# Patient Record
Sex: Male | Born: 1960 | Race: Black or African American | Hispanic: No | Marital: Single | Smoking: Current every day smoker
Health system: Southern US, Community
[De-identification: ages and names within clinical notes are randomized; demographics above are authoritative.]

## PROBLEM LIST (undated history)

## (undated) DIAGNOSIS — M1990 Unspecified osteoarthritis, unspecified site: Secondary | ICD-10-CM

## (undated) DIAGNOSIS — B1920 Unspecified viral hepatitis C without hepatic coma: Secondary | ICD-10-CM

## (undated) DIAGNOSIS — B356 Tinea cruris: Secondary | ICD-10-CM

## (undated) HISTORY — PX: OTHER SURGICAL HISTORY: SHX169

## (undated) HISTORY — PX: COLON SURGERY: SHX602

---

## 2003-10-12 ENCOUNTER — Emergency Department: Payer: Self-pay | Admitting: Emergency Medicine

## 2004-03-14 ENCOUNTER — Emergency Department: Payer: Self-pay | Admitting: General Practice

## 2007-08-28 ENCOUNTER — Emergency Department: Payer: Self-pay | Admitting: Internal Medicine

## 2007-12-06 ENCOUNTER — Emergency Department: Payer: Self-pay | Admitting: Emergency Medicine

## 2008-01-27 ENCOUNTER — Emergency Department: Payer: Self-pay | Admitting: Internal Medicine

## 2009-12-06 ENCOUNTER — Emergency Department: Payer: Self-pay | Admitting: Emergency Medicine

## 2011-02-06 ENCOUNTER — Emergency Department (HOSPITAL_COMMUNITY): Payer: Medicaid Other

## 2011-02-06 ENCOUNTER — Encounter (HOSPITAL_COMMUNITY): Payer: Self-pay | Admitting: *Deleted

## 2011-02-06 ENCOUNTER — Inpatient Hospital Stay (HOSPITAL_COMMUNITY)
Admission: EM | Admit: 2011-02-06 | Discharge: 2011-02-13 | DRG: 329 | Disposition: A | Discharge status: Home / Self Care | Payer: Medicaid Other | Attending: General Surgery | Admitting: General Surgery

## 2011-02-06 ENCOUNTER — Encounter (HOSPITAL_COMMUNITY): Payer: Self-pay | Admitting: Emergency Medicine

## 2011-02-06 ENCOUNTER — Encounter (HOSPITAL_COMMUNITY): Payer: Self-pay | Admitting: Anesthesiology

## 2011-02-06 ENCOUNTER — Inpatient Hospital Stay (HOSPITAL_COMMUNITY): Payer: Medicaid Other

## 2011-02-06 ENCOUNTER — Encounter (HOSPITAL_COMMUNITY): Admission: EM | Disposition: A | Discharge status: Home / Self Care | Payer: Self-pay | Source: Home / Self Care

## 2011-02-06 ENCOUNTER — Other Ambulatory Visit (INDEPENDENT_AMBULATORY_CARE_PROVIDER_SITE_OTHER): Payer: Self-pay | Admitting: General Surgery

## 2011-02-06 ENCOUNTER — Emergency Department (HOSPITAL_COMMUNITY): Payer: Medicaid Other | Admitting: Anesthesiology

## 2011-02-06 ENCOUNTER — Other Ambulatory Visit: Payer: Self-pay

## 2011-02-06 DIAGNOSIS — S93401A Sprain of unspecified ligament of right ankle, initial encounter: Secondary | ICD-10-CM

## 2011-02-06 DIAGNOSIS — K929 Disease of digestive system, unspecified: Secondary | ICD-10-CM | POA: Diagnosis not present

## 2011-02-06 DIAGNOSIS — R21 Rash and other nonspecific skin eruption: Secondary | ICD-10-CM | POA: Diagnosis present

## 2011-02-06 DIAGNOSIS — S058X9A Other injuries of unspecified eye and orbit, initial encounter: Secondary | ICD-10-CM | POA: Diagnosis present

## 2011-02-06 DIAGNOSIS — S025XXA Fracture of tooth (traumatic), initial encounter for closed fracture: Secondary | ICD-10-CM

## 2011-02-06 DIAGNOSIS — R000 Tachycardia, unspecified: Secondary | ICD-10-CM | POA: Diagnosis present

## 2011-02-06 DIAGNOSIS — S36893A Laceration of other intra-abdominal organs, initial encounter: Secondary | ICD-10-CM | POA: Diagnosis present

## 2011-02-06 DIAGNOSIS — S92109A Unspecified fracture of unspecified talus, initial encounter for closed fracture: Secondary | ICD-10-CM | POA: Diagnosis present

## 2011-02-06 DIAGNOSIS — S0191XA Laceration without foreign body of unspecified part of head, initial encounter: Secondary | ICD-10-CM

## 2011-02-06 DIAGNOSIS — K661 Hemoperitoneum: Secondary | ICD-10-CM

## 2011-02-06 DIAGNOSIS — F17200 Nicotine dependence, unspecified, uncomplicated: Secondary | ICD-10-CM | POA: Diagnosis present

## 2011-02-06 DIAGNOSIS — S8000XA Contusion of unspecified knee, initial encounter: Secondary | ICD-10-CM | POA: Diagnosis present

## 2011-02-06 DIAGNOSIS — H538 Other visual disturbances: Secondary | ICD-10-CM | POA: Diagnosis present

## 2011-02-06 DIAGNOSIS — E870 Hyperosmolality and hypernatremia: Secondary | ICD-10-CM | POA: Diagnosis not present

## 2011-02-06 DIAGNOSIS — K659 Peritonitis, unspecified: Secondary | ICD-10-CM | POA: Diagnosis present

## 2011-02-06 DIAGNOSIS — S0180XA Unspecified open wound of other part of head, initial encounter: Secondary | ICD-10-CM | POA: Diagnosis present

## 2011-02-06 DIAGNOSIS — K029 Dental caries, unspecified: Secondary | ICD-10-CM | POA: Diagnosis present

## 2011-02-06 DIAGNOSIS — M1990 Unspecified osteoarthritis, unspecified site: Secondary | ICD-10-CM | POA: Diagnosis present

## 2011-02-06 DIAGNOSIS — S92101A Unspecified fracture of right talus, initial encounter for closed fracture: Secondary | ICD-10-CM | POA: Diagnosis present

## 2011-02-06 DIAGNOSIS — K567 Ileus, unspecified: Secondary | ICD-10-CM | POA: Diagnosis not present

## 2011-02-06 DIAGNOSIS — IMO0002 Reserved for concepts with insufficient information to code with codable children: Secondary | ICD-10-CM | POA: Diagnosis present

## 2011-02-06 DIAGNOSIS — S82891A Other fracture of right lower leg, initial encounter for closed fracture: Secondary | ICD-10-CM | POA: Diagnosis present

## 2011-02-06 DIAGNOSIS — B356 Tinea cruris: Secondary | ICD-10-CM | POA: Diagnosis present

## 2011-02-06 DIAGNOSIS — S36899A Unspecified injury of other intra-abdominal organs, initial encounter: Secondary | ICD-10-CM | POA: Diagnosis present

## 2011-02-06 DIAGNOSIS — F10929 Alcohol use, unspecified with intoxication, unspecified: Secondary | ICD-10-CM

## 2011-02-06 DIAGNOSIS — D62 Acute posthemorrhagic anemia: Secondary | ICD-10-CM | POA: Diagnosis not present

## 2011-02-06 DIAGNOSIS — S8263XA Displaced fracture of lateral malleolus of unspecified fibula, initial encounter for closed fracture: Secondary | ICD-10-CM | POA: Diagnosis present

## 2011-02-06 DIAGNOSIS — S0181XA Laceration without foreign body of other part of head, initial encounter: Secondary | ICD-10-CM | POA: Diagnosis present

## 2011-02-06 DIAGNOSIS — K560 Paralytic ileus: Secondary | ICD-10-CM | POA: Diagnosis not present

## 2011-02-06 DIAGNOSIS — M069 Rheumatoid arthritis, unspecified: Secondary | ICD-10-CM | POA: Insufficient documentation

## 2011-02-06 DIAGNOSIS — S3681XA Injury of peritoneum, initial encounter: Secondary | ICD-10-CM

## 2011-02-06 HISTORY — DX: Unspecified osteoarthritis, unspecified site: M19.90

## 2011-02-06 HISTORY — PX: APPENDECTOMY: SHX54

## 2011-02-06 HISTORY — PX: LACERATION REPAIR: SHX5284

## 2011-02-06 HISTORY — PX: BOWEL RESECTION: SHX1257

## 2011-02-06 HISTORY — PX: LAPAROTOMY: SHX154

## 2011-02-06 LAB — POCT I-STAT, CHEM 8
BUN: 9 mg/dL (ref 6–23)
Calcium, Ion: 1.15 mmol/L (ref 1.12–1.32)
Glucose, Bld: 106 mg/dL — ABNORMAL HIGH (ref 70–99)
TCO2: 21 mmol/L (ref 0–100)

## 2011-02-06 LAB — PROTIME-INR
INR: 0.99 (ref 0.00–1.49)
Prothrombin Time: 13.3 seconds (ref 11.6–15.2)

## 2011-02-06 LAB — COMPREHENSIVE METABOLIC PANEL
ALT: 47 U/L (ref 0–53)
Albumin: 3.6 g/dL (ref 3.5–5.2)
Alkaline Phosphatase: 80 U/L (ref 39–117)
Potassium: 3.6 mEq/L (ref 3.5–5.1)
Sodium: 144 mEq/L (ref 135–145)
Total Protein: 7.3 g/dL (ref 6.0–8.3)

## 2011-02-06 LAB — URINALYSIS, MICROSCOPIC ONLY
Bilirubin Urine: NEGATIVE
Protein, ur: 30 mg/dL — AB
Urobilinogen, UA: 0.2 mg/dL (ref 0.0–1.0)

## 2011-02-06 LAB — CBC
HCT: 45.4 % (ref 39.0–52.0)
MCHC: 33 g/dL (ref 30.0–36.0)
MCV: 89.7 fL (ref 78.0–100.0)
RDW: 15 % (ref 11.5–15.5)

## 2011-02-06 LAB — MRSA PCR SCREENING: MRSA by PCR: NEGATIVE

## 2011-02-06 LAB — ETHANOL: Alcohol, Ethyl (B): 117 mg/dL — ABNORMAL HIGH (ref 0–11)

## 2011-02-06 SURGERY — LAPAROTOMY, EXPLORATORY
Anesthesia: General | Site: Face | Laterality: Right | Wound class: Clean Contaminated

## 2011-02-06 MED ORDER — SODIUM CHLORIDE 0.9 % IJ SOLN: 9.0000 mL | INTRAMUSCULAR | Status: DC | PRN

## 2011-02-06 MED ORDER — PROPOFOL 10 MG/ML IV EMUL
INTRAVENOUS | Status: DC | PRN
  Administered 2011-02-06: 20 mg via INTRAVENOUS
  Administered 2011-02-06: 180 mg via INTRAVENOUS

## 2011-02-06 MED ORDER — GLYCOPYRROLATE 0.2 MG/ML IJ SOLN
INTRAMUSCULAR | Status: DC | PRN
  Administered 2011-02-06: .8 mg via INTRAVENOUS

## 2011-02-06 MED ORDER — TOLNAFTATE 1 % EX POWD: Freq: Two times a day (BID) | CUTANEOUS | Status: DC

## 2011-02-06 MED ORDER — IOHEXOL 300 MG/ML  SOLN: 100.0000 mL | Freq: Once | INTRAMUSCULAR | Status: DC | PRN

## 2011-02-06 MED ORDER — 0.9 % SODIUM CHLORIDE (POUR BTL) OPTIME
TOPICAL | Status: DC | PRN
  Administered 2011-02-06: 7000 mL

## 2011-02-06 MED ORDER — HYDROMORPHONE HCL PF 1 MG/ML IJ SOLN
0.2500 mg | INTRAMUSCULAR | Status: DC | PRN
  Administered 2011-02-06 (×4): 0.5 mg via INTRAVENOUS

## 2011-02-06 MED ORDER — SODIUM CHLORIDE 0.9 % IV SOLN
INTRAVENOUS | Status: DC | PRN
  Administered 2011-02-06: 09:00:00 via INTRAVENOUS

## 2011-02-06 MED ORDER — SUCCINYLCHOLINE CHLORIDE 20 MG/ML IJ SOLN
INTRAMUSCULAR | Status: DC | PRN
  Administered 2011-02-06: 110 mg via INTRAVENOUS

## 2011-02-06 MED ORDER — PANTOPRAZOLE SODIUM 40 MG PO TBEC
40.0000 mg | DELAYED_RELEASE_TABLET | Freq: Every day | ORAL | Status: DC
Start: 2011-02-06 — End: 2011-02-13
  Administered 2011-02-09 – 2011-02-12 (×3): 40 mg via ORAL
  Filled 2011-02-06 (×3): qty 1

## 2011-02-06 MED ORDER — ROCURONIUM BROMIDE 100 MG/10ML IV SOLN
INTRAVENOUS | Status: DC | PRN
  Administered 2011-02-06 (×2): 10 mg via INTRAVENOUS
  Administered 2011-02-06: 40 mg via INTRAVENOUS

## 2011-02-06 MED ORDER — POVIDONE-IODINE 10 % EX OINT
TOPICAL_OINTMENT | CUTANEOUS | Status: DC | PRN
  Administered 2011-02-06: 1 via TOPICAL

## 2011-02-06 MED ORDER — SODIUM CHLORIDE 0.9 % IV SOLN
3.0000 g | Freq: Four times a day (QID) | INTRAVENOUS | Status: AC
  Administered 2011-02-06 – 2011-02-07 (×3): 3 g via INTRAVENOUS
  Filled 2011-02-06 (×3): qty 3

## 2011-02-06 MED ORDER — FENTANYL CITRATE 0.05 MG/ML IJ SOLN
INTRAMUSCULAR | Status: DC | PRN
  Administered 2011-02-06 (×4): 100 ug via INTRAVENOUS
  Administered 2011-02-06: 50 ug via INTRAVENOUS

## 2011-02-06 MED ORDER — NEOSTIGMINE METHYLSULFATE 1 MG/ML IJ SOLN
INTRAMUSCULAR | Status: DC | PRN
  Administered 2011-02-06: 5 mg via INTRAVENOUS

## 2011-02-06 MED ORDER — SODIUM CHLORIDE 0.9 % IV SOLN
INTRAVENOUS | Status: DC
  Administered 2011-02-06: 07:00:00 via INTRAVENOUS

## 2011-02-06 MED ORDER — MIDAZOLAM HCL 5 MG/5ML IJ SOLN
INTRAMUSCULAR | Status: DC | PRN
  Administered 2011-02-06 (×2): 1 mg via INTRAVENOUS

## 2011-02-06 MED ORDER — PHENYLEPHRINE HCL 10 MG/ML IJ SOLN
10.0000 mg | INTRAMUSCULAR | Status: DC | PRN
  Administered 2011-02-06: 20 ug/min via INTRAVENOUS

## 2011-02-06 MED ORDER — HYDROMORPHONE HCL PF 1 MG/ML IJ SOLN
INTRAMUSCULAR | Status: AC
  Filled 2011-02-06: qty 1

## 2011-02-06 MED ORDER — DROPERIDOL 2.5 MG/ML IJ SOLN: 0.6250 mg | INTRAMUSCULAR | Status: DC | PRN

## 2011-02-06 MED ORDER — CEFAZOLIN SODIUM 1-5 GM-% IV SOLN
1.0000 g | Freq: Once | INTRAVENOUS | Status: AC
  Administered 2011-02-06: 1 g via INTRAVENOUS
  Filled 2011-02-06: qty 50

## 2011-02-06 MED ORDER — FENTANYL CITRATE 0.05 MG/ML IJ SOLN
50.0000 ug | INTRAMUSCULAR | Status: DC | PRN
  Administered 2011-02-06: 50 ug via INTRAVENOUS
  Filled 2011-02-06: qty 2

## 2011-02-06 MED ORDER — NALOXONE HCL 0.4 MG/ML IJ SOLN: 0.4000 mg | INTRAMUSCULAR | Status: DC | PRN

## 2011-02-06 MED ORDER — ONDANSETRON HCL 4 MG/2ML IJ SOLN
4.0000 mg | Freq: Four times a day (QID) | INTRAMUSCULAR | Status: DC | PRN
  Administered 2011-02-06 – 2011-02-09 (×2): 4 mg via INTRAVENOUS
  Filled 2011-02-06 (×3): qty 2

## 2011-02-06 MED ORDER — DIPHENHYDRAMINE HCL 50 MG/ML IJ SOLN
12.5000 mg | Freq: Four times a day (QID) | INTRAMUSCULAR | Status: DC | PRN
  Administered 2011-02-08: 12.5 mg via INTRAVENOUS
  Filled 2011-02-06: qty 1

## 2011-02-06 MED ORDER — POTASSIUM CHLORIDE IN NACL 20-0.45 MEQ/L-% IV SOLN
INTRAVENOUS | Status: DC
  Administered 2011-02-06 – 2011-02-09 (×7): via INTRAVENOUS
  Administered 2011-02-09: 100 mL/h via INTRAVENOUS
  Administered 2011-02-10 – 2011-02-13 (×7): via INTRAVENOUS
  Filled 2011-02-06 (×22): qty 1000

## 2011-02-06 MED ORDER — PANTOPRAZOLE SODIUM 40 MG IV SOLR
40.0000 mg | Freq: Every day | INTRAVENOUS | Status: DC
  Administered 2011-02-06 – 2011-02-10 (×4): 40 mg via INTRAVENOUS
  Filled 2011-02-06 (×7): qty 40

## 2011-02-06 MED ORDER — SODIUM CHLORIDE 0.9 % IV SOLN
3.0000 g | Freq: Once | INTRAVENOUS | Status: AC
  Administered 2011-02-06: 3 g via INTRAVENOUS
  Filled 2011-02-06: qty 3

## 2011-02-06 MED ORDER — MORPHINE SULFATE (PF) 1 MG/ML IV SOLN
INTRAVENOUS | Status: DC
  Administered 2011-02-06: 9 mg via INTRAVENOUS
  Administered 2011-02-06: 25 mL via INTRAVENOUS
  Administered 2011-02-06: 16:00:00 via INTRAVENOUS
  Administered 2011-02-06: 8.5 mg via INTRAVENOUS
  Administered 2011-02-06: 24.73 mg via INTRAVENOUS
  Administered 2011-02-07: 15 mg via INTRAVENOUS
  Administered 2011-02-07: 13 mg via INTRAVENOUS
  Administered 2011-02-07: 31 mg via INTRAVENOUS
  Administered 2011-02-07: 23:00:00 via INTRAVENOUS
  Administered 2011-02-07: 25.33 mg via INTRAVENOUS
  Administered 2011-02-07: 10.5 mg via INTRAVENOUS
  Administered 2011-02-08: 12 mg via INTRAVENOUS
  Administered 2011-02-08: 03:00:00 via INTRAVENOUS
  Administered 2011-02-08: 29.46 mg via INTRAVENOUS
  Administered 2011-02-08: 13.5 mg via INTRAVENOUS
  Administered 2011-02-08: 21:00:00 via INTRAVENOUS
  Administered 2011-02-08: 19.5 mg via INTRAVENOUS
  Administered 2011-02-08: 18 mg via INTRAVENOUS
  Administered 2011-02-09: 6 mg via INTRAVENOUS
  Administered 2011-02-09: 10.5 mg via INTRAVENOUS
  Administered 2011-02-09: 14.9 mg via INTRAVENOUS
  Administered 2011-02-09: 12 mg via INTRAVENOUS
  Administered 2011-02-09: 10:00:00 via INTRAVENOUS
  Administered 2011-02-09: 16.3 mg via INTRAVENOUS
  Administered 2011-02-09: 19:00:00 via INTRAVENOUS
  Administered 2011-02-09: 7.5 mg via INTRAVENOUS
  Administered 2011-02-09: 03:00:00 via INTRAVENOUS
  Administered 2011-02-09: 13.5 mg via INTRAVENOUS
  Administered 2011-02-10: 10.5 mg via INTRAVENOUS
  Administered 2011-02-10: 07:00:00 via INTRAVENOUS
  Administered 2011-02-10: 10.5 mg via INTRAVENOUS
  Administered 2011-02-10: 7.5 mg via INTRAVENOUS
  Administered 2011-02-10: 22:00:00 via INTRAVENOUS
  Administered 2011-02-10 – 2011-02-11 (×2): 7.5 mg via INTRAVENOUS
  Administered 2011-02-11: 9 mg via INTRAVENOUS
  Administered 2011-02-11: 4.5 mg via INTRAVENOUS
  Administered 2011-02-11: 10:00:00 via INTRAVENOUS
  Administered 2011-02-11: 10.5 mg via INTRAVENOUS
  Administered 2011-02-11: 5.6 mg via INTRAVENOUS
  Administered 2011-02-11: 19:00:00 via INTRAVENOUS
  Administered 2011-02-11: 10.5 mg via INTRAVENOUS
  Administered 2011-02-11: 12 mg via INTRAVENOUS
  Administered 2011-02-12: 4.39 mg via INTRAVENOUS
  Administered 2011-02-12: 9 mg via INTRAVENOUS
  Administered 2011-02-12: 08:00:00 via INTRAVENOUS
  Filled 2011-02-06 (×22): qty 25

## 2011-02-06 MED ORDER — LACTATED RINGERS IV SOLN
INTRAVENOUS | Status: DC | PRN
  Administered 2011-02-06: 10:00:00 via INTRAVENOUS

## 2011-02-06 MED ORDER — DIPHENHYDRAMINE HCL 12.5 MG/5ML PO ELIX
12.5000 mg | ORAL_SOLUTION | Freq: Four times a day (QID) | ORAL | Status: DC | PRN
  Filled 2011-02-06: qty 5

## 2011-02-06 MED ORDER — ONDANSETRON HCL 4 MG/2ML IJ SOLN
INTRAMUSCULAR | Status: DC | PRN
  Administered 2011-02-06: 4 mg via INTRAVENOUS

## 2011-02-06 MED ORDER — HETASTARCH-ELECTROLYTES 6 % IV SOLN
INTRAVENOUS | Status: DC | PRN
  Administered 2011-02-06 (×2): via INTRAVENOUS

## 2011-02-06 MED ORDER — FENTANYL CITRATE 0.05 MG/ML IJ SOLN
50.0000 ug | Freq: Once | INTRAMUSCULAR | Status: AC
  Administered 2011-02-06: 50 ug via INTRAVENOUS
  Filled 2011-02-06: qty 2

## 2011-02-06 MED ORDER — ONDANSETRON HCL 4 MG/2ML IJ SOLN
4.0000 mg | Freq: Once | INTRAMUSCULAR | Status: AC
  Administered 2011-02-06: 4 mg via INTRAVENOUS
  Filled 2011-02-06: qty 2

## 2011-02-06 SURGICAL SUPPLY — 55 items
BLADE SURG ROTATE 9660 (MISCELLANEOUS) ×4 IMPLANT
CANISTER SUCTION 2500CC (MISCELLANEOUS) ×4 IMPLANT
CHLORAPREP W/TINT 26ML (MISCELLANEOUS) ×4 IMPLANT
CLOTH BEACON ORANGE TIMEOUT ST (SAFETY) ×4 IMPLANT
COVER SURGICAL LIGHT HANDLE (MISCELLANEOUS) ×8 IMPLANT
DRAPE LAPAROSCOPIC ABDOMINAL (DRAPES) ×4 IMPLANT
DRAPE UTILITY 15X26 W/TAPE STR (DRAPE) ×8 IMPLANT
DRAPE WARM FLUID 44X44 (DRAPE) ×4 IMPLANT
ELECT BLADE 6.5 EXT (BLADE) ×4 IMPLANT
ELECT CAUTERY BLADE 6.4 (BLADE) ×4 IMPLANT
ELECT COATED BLADE 2.86 ST (ELECTRODE) ×4 IMPLANT
ELECT REM PT RETURN 9FT ADLT (ELECTROSURGICAL) ×4
ELECTRODE REM PT RTRN 9FT ADLT (ELECTROSURGICAL) ×3 IMPLANT
GAUZE SPONGE 4X4 16PLY XRAY LF (GAUZE/BANDAGES/DRESSINGS) ×4 IMPLANT
GLOVE BIO SURGEON STRL SZ8 (GLOVE) ×8 IMPLANT
GLOVE BIOGEL PI IND STRL 7.0 (GLOVE) ×9 IMPLANT
GLOVE BIOGEL PI IND STRL 8 (GLOVE) ×6 IMPLANT
GLOVE BIOGEL PI INDICATOR 7.0 (GLOVE) ×3
GLOVE BIOGEL PI INDICATOR 8 (GLOVE) ×2
GLOVE ECLIPSE 7.0 STRL STRAW (GLOVE) ×12 IMPLANT
GLOVE ECLIPSE 7.5 STRL STRAW (GLOVE) ×12 IMPLANT
GOWN STRL NON-REIN LRG LVL3 (GOWN DISPOSABLE) ×20 IMPLANT
KIT BASIN OR (CUSTOM PROCEDURE TRAY) ×4 IMPLANT
KIT ROOM TURNOVER OR (KITS) ×4 IMPLANT
LIGASURE IMPACT 36 18CM CVD LR (INSTRUMENTS) ×4 IMPLANT
NS IRRIG 1000ML POUR BTL (IV SOLUTION) ×28 IMPLANT
PACK EENT II TURBAN DRAPE (CUSTOM PROCEDURE TRAY) ×4 IMPLANT
PACK GENERAL/GYN (CUSTOM PROCEDURE TRAY) ×4 IMPLANT
PAD ARMBOARD 7.5X6 YLW CONV (MISCELLANEOUS) ×8 IMPLANT
PENCIL BUTTON HOLSTER BLD 10FT (ELECTRODE) ×4 IMPLANT
RELOAD PROXIMATE 75MM BLUE (ENDOMECHANICALS) ×8 IMPLANT
SET BSS IRRIGATION ADMIN (SET/KITS/TRAYS/PACK) ×4 IMPLANT
SPECIMEN JAR X LARGE (MISCELLANEOUS) IMPLANT
SPONGE GAUZE 4X4 12PLY (GAUZE/BANDAGES/DRESSINGS) ×4 IMPLANT
SPONGE LAP 18X18 X RAY DECT (DISPOSABLE) ×4 IMPLANT
STAPLER GUN LINEAR PROX 60 (STAPLE) ×4 IMPLANT
STAPLER PROXIMATE 75MM BLUE (STAPLE) ×4 IMPLANT
STAPLER VISISTAT 35W (STAPLE) ×4 IMPLANT
SUCTION POOLE TIP (SUCTIONS) ×4 IMPLANT
SUT CHROMIC 0 SH (SUTURE) ×4 IMPLANT
SUT PDS AB 1 TP1 96 (SUTURE) ×8 IMPLANT
SUT PROLENE 5 0 PC 1 (SUTURE) ×4 IMPLANT
SUT SILK 2 0 SH CR/8 (SUTURE) ×4 IMPLANT
SUT SILK 2 0 TIES 10X30 (SUTURE) ×4 IMPLANT
SUT SILK 3 0 SH CR/8 (SUTURE) ×4 IMPLANT
SUT SILK 3 0 TIES 10X30 (SUTURE) ×4 IMPLANT
SUT VIC AB 4-0 PS2 27 (SUTURE) ×4 IMPLANT
SYR 30ML SLIP (SYRINGE) ×4 IMPLANT
TAPE CLOTH SURG 4X10 WHT LF (GAUZE/BANDAGES/DRESSINGS) ×4 IMPLANT
TOWEL OR 17X24 6PK STRL BLUE (TOWEL DISPOSABLE) ×4 IMPLANT
TOWEL OR 17X26 10 PK STRL BLUE (TOWEL DISPOSABLE) ×12 IMPLANT
TRAY FOLEY CATH 14FRSI W/METER (CATHETERS) ×4 IMPLANT
TUBE CONNECTING 12X1/4 (SUCTIONS) ×4 IMPLANT
WATER STERILE IRR 1000ML POUR (IV SOLUTION) ×4 IMPLANT
YANKAUER SUCT BULB TIP NO VENT (SUCTIONS) ×4 IMPLANT
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
## 2011-02-06 NOTE — Anesthesia Postprocedure Evaluation (Signed)
Anesthesia Post Note  Patient: Craig Garrett  Procedure(s) Performed:  EXPLORATORY LAPAROTOMY - Exploratory laporatomy, small bowel resection, and incidental appendectomy; REPAIR MULTIPLE LACERATIONS; SMALL BOWEL RESECTION - Exploratory laporatomy, small bowel resection, and incidental appendectomy; APPENDECTOMY - Exploratory laporatomy, small bowel resection, and incidental appendectomy  Anesthesia type: general  Patient location: PACU  Post pain: Pain level controlled  Post assessment: Patient's Cardiovascular Status Stable  Last Vitals:  Filed Vitals:   02/06/11 1200  BP:   Pulse: 94  Temp:   Resp: 18    Post vital signs: Reviewed and stable  Level of consciousness: sedated  Complications: No apparent anesthesia complications

## 2011-02-06 NOTE — Progress Notes (Signed)
Orthopedic Tech Progress Note Patient Details:  Craig Garrett 03-17-1960 [id number]  Type of Splint: Ankle Air Splint Location: (R) LE Splint Interventions: Application    Jennye Moccasin 02/06/2011, 3:34 PM

## 2011-02-06 NOTE — Brief Op Note (Signed)
02/06/2011  10:29 AM  PATIENT:  Craig Garrett  51 y.o. male  PRE-OPERATIVE DIAGNOSIS:  Right facial and eyelid lacerations  POST-OPERATIVE DIAGNOSIS:  Right facial and eyelid lacerations  PROCEDURE:  Procedure(s): Right complex midface and eyelid laceration repair (total 11cm).  SURGEON:   Kadiatou Oplinger Philomena Doheny, MD  PHYSICIAN ASSISTANT:   ASSISTANTS: none   ANESTHESIA:   general  EBL:  Total I/O In: 1200 [I.V.:700; IV Piggyback:500] Out: -   BLOOD ADMINISTERED:none  DRAINS: none   LOCAL MEDICATIONS USED:  NONE  SPECIMEN:  No Specimen  DISPOSITION OF SPECIMEN:  N/A  COUNTS:  YES  TOURNIQUET:  * No tourniquets in log *  DICTATION: .Other Dictation: Dictation Number [id number]  PLAN OF CARE: Admit to inpatient   PATIENT DISPOSITION:  PACU - hemodynamically stable.   Delay start of Pharmacological VTE agent (>24hrs) due to surgical blood loss or risk of bleeding:  not applicable

## 2011-02-06 NOTE — ED Notes (Signed)
Pt was taken to x-ray by [facility]

## 2011-02-06 NOTE — ED Notes (Signed)
Pt son Assunta Curtis contacted [phone], Daughter Steward Ros phone for updates [phone].
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
## 2011-02-06 NOTE — ED Notes (Signed)
To or 
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
## 2011-02-06 NOTE — Anesthesia Procedure Notes (Signed)
Procedure Name: Intubation Date/Time: 02/06/2011 9:15 AM Performed by: Darcey Nora Pre-anesthesia Checklist: Patient identified, Emergency Drugs available, Suction available and Patient being monitored Patient Re-evaluated:Patient Re-evaluated prior to inductionOxygen Delivery Method: Circle System Utilized Preoxygenation: Pre-oxygenation with 100% oxygen Intubation Type: IV induction and Rapid sequence Grade View: Grade I Tube type: Oral Tube size: 7.5 mm Number of attempts: 1 Airway Equipment and Method: stylet Placement Confirmation: ETT inserted through vocal cords under direct vision,  positive ETCO2 and breath sounds checked- equal and bilateral Secured at: 23 cm Tube secured with: Tape Dental Injury: Teeth and Oropharynx as per pre-operative assessment
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
## 2011-02-06 NOTE — OR Nursing (Signed)
Laceration repair start at 0933 and end at 1028.

## 2011-02-06 NOTE — Transfer of Care (Signed)
Immediate Anesthesia Transfer of Care Note  Patient: Craig Garrett  Procedure(s) Performed:  EXPLORATORY LAPAROTOMY - Exploratory laporatomy, small bowel resection, and incidental appendectomy; REPAIR MULTIPLE LACERATIONS; SMALL BOWEL RESECTION - Exploratory laporatomy, small bowel resection, and incidental appendectomy; APPENDECTOMY - Exploratory laporatomy, small bowel resection, and incidental appendectomy  Patient Location: PACU  Anesthesia Type: General  Level of Consciousness: awake, sedated and patient cooperative  Airway & Oxygen Therapy: Patient Spontanous Breathing and Patient connected to face mask oxygen  Post-op Assessment: Report given to PACU RN, Post -op Vital signs reviewed and stable and Patient moving all extremities  Post vital signs: Reviewed and stable  Complications: No apparent anesthesia complications

## 2011-02-06 NOTE — Preoperative (Signed)
Beta Blockers   Reason not to administer Beta Blockers:Not Applicable 

## 2011-02-06 NOTE — Progress Notes (Signed)
Orthopaedic Addendum  Just spoke with radiologist regarding earlier ankle films.  ? Medial cortical irregularity of talus, either fx vs degenerative changes.  The presence of a fracture, albeit nondisplaced, could drastically change activity level and treatment plan for R foot and ankle Will get CT scan to evaluate tarsal bones.  NWB for now on R leg   Mearl Latin, PA-C 02/06/2011 1615

## 2011-02-06 NOTE — Anesthesia Preprocedure Evaluation (Signed)
Anesthesia Evaluation  Patient identified by MRN, date of birth, ID band Patient awake    Reviewed: Allergy & Precautions, H&P , NPO status , Patient's Chart, lab work & pertinent test results, reviewed documented beta blocker date and time   History of Anesthesia Complications Negative for: history of anesthetic complications  Airway Mallampati: II TM Distance: >3 FB Neck ROM: Full    Dental  (+) Poor Dentition and Dental Advisory Given   Pulmonary Current Smoker,  clear to auscultation+ rhonchi  Pulmonary exam normal       Cardiovascular neg cardio ROS Regular Normal- Systolic murmurs    Neuro/Psych Negative Neurological ROS     GI/Hepatic negative GI ROS, (+) Hepatitis -, B  Endo/Other  Negative Endocrine ROS  Renal/GU      Musculoskeletal  (+) Arthritis -,   Abdominal   Peds  Hematology negative hematology ROS (+)   Anesthesia Other Findings   Reproductive/Obstetrics                           Anesthesia Physical Anesthesia Plan  ASA: III and Emergent  Anesthesia Plan:    Post-op Pain Management:    Induction: Rapid sequence, Intravenous and Cricoid pressure planned  Airway Management Planned: Oral ETT  Additional Equipment:   Intra-op Plan:   Post-operative Plan: Possible Post-op intubation/ventilation  Informed Consent: I have reviewed the patients History and Physical, chart, labs and discussed the procedure including the risks, benefits and alternatives for the proposed anesthesia with the patient or authorized representative who has indicated his/her understanding and acceptance.   Dental advisory given  Plan Discussed with: CRNA, Anesthesiologist and Surgeon  Anesthesia Plan Comments:         Anesthesia Quick Evaluation

## 2011-02-06 NOTE — H&P (Signed)
The patient's abdomen is very tender.  He has peritonitis.  Complex right facial laceration involving the soft tissue of the zygoma and the corner of the right eye.  Needs exploratory laparotomy, repair of facial laceration.  This patient has been seen and I agree with the findings and treatment plan.  Marta Lamas. Gae Bon, MD, FACS [phone] (pager) [phone] (direct pager) Trauma Surgeon
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
## 2011-02-06 NOTE — ED Notes (Signed)
EMT [name] at bedside irrigating facial laceration.  Pt returned from x-ray.

## 2011-02-06 NOTE — ED Notes (Signed)
  Pt transported to ct 
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
## 2011-02-07 ENCOUNTER — Encounter (HOSPITAL_COMMUNITY): Payer: Self-pay | Admitting: General Surgery

## 2011-02-07 DIAGNOSIS — D62 Acute posthemorrhagic anemia: Secondary | ICD-10-CM

## 2011-02-07 DIAGNOSIS — S36893A Laceration of other intra-abdominal organs, initial encounter: Secondary | ICD-10-CM | POA: Diagnosis present

## 2011-02-07 DIAGNOSIS — S0180XA Unspecified open wound of other part of head, initial encounter: Secondary | ICD-10-CM

## 2011-02-07 DIAGNOSIS — K567 Ileus, unspecified: Secondary | ICD-10-CM | POA: Diagnosis not present

## 2011-02-07 DIAGNOSIS — S92101A Unspecified fracture of right talus, initial encounter for closed fracture: Secondary | ICD-10-CM | POA: Diagnosis present

## 2011-02-07 DIAGNOSIS — R05 Cough: Secondary | ICD-10-CM

## 2011-02-07 DIAGNOSIS — K9189 Other postprocedural complications and disorders of digestive system: Secondary | ICD-10-CM | POA: Diagnosis not present

## 2011-02-07 DIAGNOSIS — R059 Cough, unspecified: Secondary | ICD-10-CM

## 2011-02-07 LAB — CBC
Hemoglobin: 11.7 g/dL — ABNORMAL LOW (ref 13.0–17.0)
MCH: 29.4 pg (ref 26.0–34.0)
MCV: 89.9 fL (ref 78.0–100.0)
RBC: 3.98 MIL/uL — ABNORMAL LOW (ref 4.22–5.81)

## 2011-02-07 LAB — EXPECTORATED SPUTUM ASSESSMENT W GRAM STAIN, RFLX TO RESP C

## 2011-02-07 MED ORDER — BACITRACIN ZINC 500 UNIT/GM EX OINT
TOPICAL_OINTMENT | CUTANEOUS | Status: DC | PRN
  Filled 2011-02-07 (×2): qty 15

## 2011-02-07 MED ORDER — BACITRACIN ZINC 500 UNIT/GM EX OINT
TOPICAL_OINTMENT | Freq: Two times a day (BID) | CUTANEOUS | Status: DC
Start: 2011-02-07 — End: 2011-02-13
  Administered 2011-02-07 – 2011-02-13 (×13): via TOPICAL
  Filled 2011-02-07: qty 15

## 2011-02-07 MED ORDER — ENOXAPARIN SODIUM 30 MG/0.3ML ~~LOC~~ SOLN
30.0000 mg | Freq: Two times a day (BID) | SUBCUTANEOUS | Status: DC
  Administered 2011-02-07 – 2011-02-13 (×13): 30 mg via SUBCUTANEOUS
  Filled 2011-02-07 (×16): qty 0.3
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
## 2011-02-07 NOTE — Progress Notes (Signed)
Called regarding HR 138.  Pt seen and examined.  He looks well and has no complaints.  HR now in 115-120.  Otherwise HD stable. Abdomen with minimal incisional tenderness, no distension.  No chest pain or sob.  Will continue to monitor and follow uop.
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
## 2011-02-07 NOTE — Progress Notes (Signed)
Subjective: Pt reports no new c/o. He does have some soreness around the facial laceration site.  Objective: Vital signs in last 24 hours: Temp:  [98.2 F (36.8 C)-99.8 F (37.7 C)] 99.2 F (37.3 C) (02/05 0800) Pulse Rate:  [92-120] 109  (02/05 0334) Resp:  [11-24] 11  (02/05 0757) BP: (124-150)/(52-92) 128/92 mmHg (02/05 0900) SpO2:  [94 %-100 %] 99 % (02/05 0757) FiO2 (%):  [21 %] 21 % (02/05 0334) Weight:  [90.1 kg (198 lb 10.2 oz)] 90.1 kg (198 lb 10.2 oz) (02/04 1233)  Physical Exam  Constitutional: He is oriented to person, place, and time. He appears well-developed and well-nourished. No distress.  Head: Normocephalic. His right facial laceration site is C/D/I. Vision intact bilaterally. EOMI. Facial nerve intact bilaterally. Ears: Auricles and EACs wnl. Nose: Nose normal.  Mouth/Throat: Oropharynx is clear and moist. Dental caries present. No oropharyngeal exudate.  Neck: Normal range of motion. Neck supple. No tracheal deviation present. No thyromegaly present.  Neurological: He is alert and oriented to person, place, and time. No cranial nerve deficit. Bilateral CN 5 and 7 intact.  Psychiatric: He has a normal mood and affect. His behavior is normal. Judgment and thought content normal.    Basename 02/07/11 0344 02/06/11 0537 02/06/11 0525  WBC 13.6* -- 25.2*  HGB 11.7* 16.7 --  HCT 35.8* 49.0 --  PLT 206 -- 259    Basename 02/06/11 0537 02/06/11 0525  NA 147* 144  K 3.7 3.6  CL 110 107  CO2 -- 20  GLUCOSE 106* 110*  BUN 9 10  CREATININE 1.10 0.87  CALCIUM -- 9.3    Medications:  I have reviewed the patient's current medications. Scheduled:   . ampicillin-sulbactam (UNASYN) IV  3 g Intravenous Q6H  . bacitracin   Topical BID  . enoxaparin (LOVENOX) injection  30 mg Subcutaneous Q12H  . HYDROmorphone      . morphine   Intravenous Q4H  . pantoprazole  40 mg Oral Q1200   Or  . pantoprazole (PROTONIX) IV  40 mg Intravenous Q1200  . DISCONTD: tolnaftate    Topical BID    Assessment/Plan: POD #1 s/p right facial laceration repair.  Healing well. Will leave sutures in place for 1 week.   LOS: 1 day   Hewitt Garner,SUI W 02/07/2011, 9:46 AM
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
## 2011-02-08 ENCOUNTER — Inpatient Hospital Stay (HOSPITAL_COMMUNITY): Payer: Medicaid Other

## 2011-02-08 ENCOUNTER — Other Ambulatory Visit: Payer: Self-pay

## 2011-02-08 LAB — CBC
MCH: 29.7 pg (ref 26.0–34.0)
MCHC: 32.8 g/dL (ref 30.0–36.0)
Platelets: 185 10*3/uL (ref 150–400)
RDW: 15.1 % (ref 11.5–15.5)

## 2011-02-08 MED ORDER — BIOTENE DRY MOUTH MT LIQD
15.0000 mL | Freq: Two times a day (BID) | OROMUCOSAL | Status: DC
  Administered 2011-02-08 – 2011-02-12 (×10): 15 mL via OROMUCOSAL

## 2011-02-08 MED ORDER — CHLORHEXIDINE GLUCONATE 0.12 % MT SOLN
15.0000 mL | Freq: Two times a day (BID) | OROMUCOSAL | Status: DC
  Administered 2011-02-08 – 2011-02-13 (×11): 15 mL via OROMUCOSAL
  Filled 2011-02-08 (×10): qty 15

## 2011-02-08 NOTE — Progress Notes (Signed)
Physical Therapy Treatment Patient Details Name: Craig Garrett MRN: [id number] DOB: 1960/07/20 Today's Date: 02/08/2011  PT Assessment/Plan  PT - Assessment/Plan Comments on Treatment Session: Pt not maintaining TDWB during ambulation.   PT Plan: Discharge plan remains appropriate;Equipment recommendations need to be updated Follow Up Recommendations: Home health PT Equipment Recommended: Rolling walker with 5" wheels PT Goals  Acute Rehab PT Goals Pt will go Sit to Stand: with modified independence PT Goal: Sit to Stand - Progress: Updated due to goal met Pt will go Stand to Sit: with modified independence PT Goal: Stand to Sit - Progress: Updated due to goals met PT Goal: Ambulate - Progress: Progressing toward goal  PT Treatment Precautions/Restrictions  Precautions Precautions: Fall Precaution Comments: NGT to suction Other Brace/Splint: Air splint rt ankle Restrictions Weight Bearing Restrictions: Yes RLE Weight Bearing: Touchdown weight bearing Mobility (including Balance) Transfers Sit to Stand: 5: Supervision;From bed;With upper extremity assist Sit to Stand Details (indicate cue type and reason): Verbal cues to keep wt off RLE which pt didn't follow. Stand to Sit: 5: Supervision;To bed;With upper extremity assist Stand to Sit Details: verbal cues to maintain wt bearing which pt didn't do even with cues Ambulation/Gait Ambulation/Gait: Yes Ambulation/Gait Assistance: 5: Supervision Ambulation/Gait Assistance Details (indicate cue type and reason): verbal/tactile cues to maintain TDWB Ambulation Distance (Feet): 80 Feet Assistive device: Rolling walker Gait Pattern: Step-to pattern;Decreased step length - left;Decreased stance time - right  Static Sitting Balance Static Sitting - Balance Support: No upper extremity supported Static Sitting - Level of Assistance: 7: Independent Static Standing Balance Static Standing - Balance Support: No upper extremity supported  (observed pt standing and using urinal without assistive devi) Static Standing - Level of Assistance: 5: Stand by assistance Exercise    End of Session PT - End of Session Equipment Utilized During Treatment: Gait belt Activity Tolerance: Patient tolerated treatment well Patient left: in chair;with call bell in reach Nurse Communication: Mobility status for ambulation General Behavior During Session: [facility] for tasks performed Cognition: [facility] for tasks performed  [facility] 02/08/2011, 2:37 PM  [facility] PT [phone]
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
## 2011-02-08 NOTE — Progress Notes (Signed)
The patient is doing okay.  No BM or flatus.  Will allow ice chips and water.  Likely clear  Liquids tomorrow.  This patient has been seen and I agree with the findings and treatment plan.  Marta Lamas. Gae Bon, MD, FACS [phone] (pager) [phone] (direct pager) Trauma Surgeon
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
## 2011-02-08 NOTE — Progress Notes (Signed)
UR of chart completed.  

## 2011-02-08 NOTE — Progress Notes (Signed)
Pain improving and able to bear wt in aircast without significant pain  Aircast fitting well with reduced pain and swelling  Plan: TDWB in aircast MAy remove for hygiene F/u in office in 14 days Will sign off for now PT until discharge

## 2011-02-09 LAB — CBC
Platelets: 216 10*3/uL (ref 150–400)
RDW: 14.8 % (ref 11.5–15.5)
WBC: 12.8 10*3/uL — ABNORMAL HIGH (ref 4.0–10.5)

## 2011-02-09 NOTE — Progress Notes (Signed)
Wound a little tight and shiny, but okay  This patient has been seen and I agree with the findings and treatment plan.  Marta Lamas. Gae Bon, MD, FACS [phone] (pager) [phone] (direct pager) Trauma Surgeon

## 2011-02-09 NOTE — Progress Notes (Signed)
Physical Therapy Treatment Patient Details Name: Craig Garrett MRN: [id number] DOB: [birth year] Today's Date: 02/09/2011  PT Assessment/Plan  PT - Assessment/Plan Comments on Treatment Session: Observed pt earlier in the day amb independently in hall pushing IV pole (obviously not maintaining TDWB).  Reiterated to pt that he is supposed to be TDWB on RLE which means he will have to use a walker or crutches whenever he amb.  Pt negative for any dizziness  or nystagmus with head turns or rolling. PT Plan: Discharge plan remains appropriate Follow Up Recommendations: Home health PT Equipment Recommended: Rolling walker with 5" wheels PT Goals  Acute Rehab PT Goals PT Goal: Supine/Side to Sit - Progress: Met PT Goal: Sit to Stand - Progress: Met PT Goal: Stand to Sit - Progress: Met PT Transfer Goal: Bed to Chair/Chair to Bed - Progress: Met PT Goal: Ambulate - Progress: Met PT Goal: Propel Wheelchair - Progress: Discontinued (comment) (pt now ambulatory)  PT Treatment Precautions/Restrictions  Precautions Precautions: Fall Precaution Comments: NGT to suction Other Brace/Splint: Air splint rt ankle Restrictions Weight Bearing Restrictions: Yes RLE Weight Bearing: Touchdown weight bearing Mobility (including Balance) Bed Mobility Supine to Sit: 6: Modified independent (Device/Increase time) Supine to Sit Details (indicate cue type and reason): incr time Sitting - Scoot to Edge of Bed: 6: Modified independent (Device/Increase time) Sitting - Scoot to Edge of Bed Details (indicate cue type and reason): incr time Transfers Sit to Stand: 6: Modified independent (Device/Increase time) Sit to Stand Details (indicate cue type and reason): Doesn't maintain TDWB Stand to Sit: 6: Modified independent (Device/Increase time) Stand Pivot Transfers: 6: Modified independent (Device/Increase time) Stand Pivot Transfer Details (indicate cue type and reason): Doesn't maintain  TDWB Ambulation/Gait Ambulation Distance (Feet): 150 Feet Assistive device: Rolling walker Gait Pattern: Decreased step length - left    Exercise    End of Session PT - End of Session Equipment Utilized During Treatment: Other (comment) (airsplint RLE) Activity Tolerance: Patient tolerated treatment well Patient left: in bed Nurse Communication: Mobility status for ambulation General Behavior During Session: [facility] for tasks performed Cognition: Impaired (Doesn't seem to understand need for TDWB)  Craig Garrett 02/09/2011, 3:07 PM
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
## 2011-02-10 ENCOUNTER — Inpatient Hospital Stay (HOSPITAL_COMMUNITY): Payer: Medicaid Other

## 2011-02-10 LAB — CULTURE, RESPIRATORY W GRAM STAIN: Culture: NORMAL

## 2011-02-10 LAB — CBC
HCT: 36.6 % — ABNORMAL LOW (ref 39.0–52.0)
Hemoglobin: 11.7 g/dL — ABNORMAL LOW (ref 13.0–17.0)
MCV: 89.9 fL (ref 78.0–100.0)
RBC: 4.07 MIL/uL — ABNORMAL LOW (ref 4.22–5.81)
WBC: 11.2 10*3/uL — ABNORMAL HIGH (ref 4.0–10.5)

## 2011-02-10 MED ORDER — SODIUM CHLORIDE 0.9 % IJ SOLN: 10.0000 mL | INTRAMUSCULAR | Status: DC | PRN
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
## 2011-02-10 NOTE — Progress Notes (Signed)
Physical Therapy Treatment Patient Details Name: Craig Garrett MRN: [id number] DOB: [birth year] Today's Date: 02/10/2011  PT Assessment/Plan  PT - Assessment/Plan Comments on Treatment Session: Patient postive for BPPV - we will continue to treat accordingly. Secondary to nausea and vomitting this am and abdominal pain patient declined OOB activity today.  PT Plan: Discharge plan remains appropriate;Frequency remains appropriate Equipment Recommended: Rolling walker with 5" wheels PT Goals  Not addressed this session  PT Treatment Precautions/Restrictions  Precautions Precautions: Fall Precaution Comments: + dixx hallpike for right post. canal BPPV Other Brace/Splint: Air splint rt ankle Restrictions Weight Bearing Restrictions: Yes RLE Weight Bearing: Touchdown weight bearing Exercise  Other Exercises Other Exercises: Patient tested for BPPV - smooth pusuits WNL, no nystagmus with head movements, reported dizziness with sitting up in bed in preparation for dix hallpike but no nystagmus noted. Used bed and raised to max height (head of bed) and then moved bed into trendelenberg position - slowly lowered into testing position for right post canal BPPV as secondary to abdominal pain patient was unable to do at full speed. Even at this speed postive rotational nystagmus indicating right post canal BPPV - duration approx 5 seconds. Treated with canalith repositioning manuever and educated in what BPPV is and its treatment with handout supplied.  End of Session PT - End of Session Activity Tolerance: Patient tolerated treatment well Patient left: in bed;with call bell in reach Nurse Communication:  (Informed covering nurse of findings) General Behavior During Session: [facility] for tasks performed Cognition: [facility] for tasks performed  Edwyna Perfect, PT  Pager [phone] 02/10/2011, 9:09 AM
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
## 2011-02-10 NOTE — Progress Notes (Signed)
More of an ileus today.  Will slow down on PO intake.  This patient has been seen and I agree with the findings and treatment plan.  Marta Lamas. Gae Bon, MD, FACS [phone] (pager) [phone] (direct pager) Trauma Surgeon

## 2011-02-11 NOTE — Progress Notes (Signed)
Physical Therapy Treatment Patient Details Name: Craig Garrett MRN: [id number] DOB: 08/26/1960 Today's Date: 02/11/2011  PT Assessment/Plan  PT - Assessment/Plan Comments on Treatment Session: Patient negative for BPPV today. We will continue to monitor for any signs or symptoms of reoccurence when following for gait progression. PT Plan: Discharge plan needs to be updated;Frequency remains appropriate PT Frequency: Min 5X/week Follow Up Recommendations: Outpatient PT Equipment Recommended: Rolling walker with 5" wheels PT Goals   Mobility goals to be addressed next session.  PT Treatment Precautions/Restrictions  Precautions Precautions: Fall Precaution Comments: + dixx hallpike for right post. canal BPPV Other Brace/Splint: Air splint rt ankle Restrictions Weight Bearing Restrictions: Yes RLE Weight Bearing: Touchdown weight bearing Mobility (including Balance)   Patient declines.   Exercise  Other Exercises Other Exercises: Mobility not assessed as patient declines he states he has been walking with nurses on a regular basis and went on a "long walk" yesterday. Dix hallpike tested bilaterally today without any dizziness or nystagmus noted. Educated patient in [place] exercises for home program if patient has reocurrence of symptoms. Patient supplied with handout of information and of exercise guidelines. Patient staets no dizziness since yesterday. End of Session PT - End of Session Activity Tolerance: Patient tolerated treatment well Patient left: in bed;with call bell in reach General Behavior During Session: [facility] for tasks performed Cognition: [facility] for tasks performed  Edwyna Perfect, PT  Pager [phone]  02/11/2011, 2:32 PM
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
## 2011-02-12 MED ORDER — OXYCODONE-ACETAMINOPHEN 5-325 MG PO TABS
1.0000 | ORAL_TABLET | ORAL | Status: DC | PRN
  Administered 2011-02-12 – 2011-02-13 (×3): 2 via ORAL
  Filled 2011-02-12 (×3): qty 2
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
## 2011-02-12 NOTE — Progress Notes (Signed)
Pt states that he has seen a yellow light out of the periphery of his right side.  No pain associated with it.
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
## 2011-02-12 NOTE — Progress Notes (Signed)
Walked with pt all the way down to [address].   Pt used walker and tolerated it well.  Pt taking po pain meds with good relief, D/C'd PCA.

## 2011-02-13 MED ORDER — POLYVINYL ALCOHOL 1.4 % OP SOLN
1.0000 [drp] | Freq: Four times a day (QID) | OPHTHALMIC | Status: DC
  Administered 2011-02-13: 1 [drp] via OPHTHALMIC
  Filled 2011-02-13: qty 15

## 2011-02-13 MED ORDER — OXYCODONE-ACETAMINOPHEN 5-325 MG PO TABS: 1.0000 | ORAL_TABLET | ORAL | Status: DC | PRN

## 2011-02-13 MED ORDER — POLYVINYL ALCOHOL 1.4 % OP SOLN: 1.0000 [drp] | Freq: Four times a day (QID) | OPHTHALMIC | Status: AC

## 2011-02-13 NOTE — Progress Notes (Signed)
Physical Therapy Treatment Patient Details Name: Craig Garrett MRN: [id number] DOB: 10-Oct-1960 Today's Date: 02/13/2011  PT Assessment/Plan  PT - Assessment/Plan Comments on Treatment Session: Patient progressing well with ambulation today. Still requiring cues for TDWB. No complaints of lightheadedness or dizziness today.  PT Plan: Discharge plan remains appropriate PT Frequency: Min 5X/week Follow Up Recommendations: Outpatient PT Equipment Recommended: Rolling walker with 5" wheels PT Goals  Acute Rehab PT Goals PT Goal: Supine/Side to Sit - Progress: Met PT Goal: Sit to Supine/Side - Progress: Met PT Goal: Sit to Stand - Progress: Met PT Goal: Stand to Sit - Progress: Met PT Goal: Ambulate - Progress: Progressing toward goal PT Goal: Up/Down Stairs - Progress: Met  PT Treatment Precautions/Restrictions  Precautions Precautions: Fall Precaution Comments: + dixx hallpike for right post. canal BPPV Other Brace/Splint: air splint R ankle Restrictions Weight Bearing Restrictions: Yes RLE Weight Bearing: Touchdown weight bearing Mobility (including Balance) Bed Mobility Supine to Sit: 7: Independent Sitting - Scoot to Edge of Bed: 7: Independent Transfers Sit to Stand: 6: Modified independent (Device/Increase time) Stand to Sit: 6: Modified independent (Device/Increase time) Ambulation/Gait Ambulation/Gait Assistance: Other (comment) (MinGuard A) Ambulation/Gait Assistance Details (indicate cue type and reason): Patient amb about 150 ft using RW then swtiched to Bilat. crutches. Patient did well WB status with use of crutches. Cues for safe use of crutches.  Ambulation Distance (Feet): 150 Feet Assistive device: Rolling walker;Crutches Gait Pattern: Step-through pattern;Trunk flexed Stairs: Yes Stairs Assistance: 4: Min assist Stair Management Technique: With crutches Number of Stairs: 4     Exercise    End of Session PT - End of Session Equipment Utilized During  Treatment: Gait belt Activity Tolerance: Patient tolerated treatment well Patient left: in bed Nurse Communication: Mobility status for transfers;Mobility status for ambulation General Behavior During Session: [facility] for tasks performed Cognition: [facility] for tasks performed  Koi Zangara, [name] 02/13/2011, 1:36 PM 02/13/2011 Fredrich Birks PTA [phone] pager [phone] office

## 2011-02-13 NOTE — Progress Notes (Signed)
Per MD order, PICC line removed. Cath intact at 45cm. Vaseline pressure gauze to site, pressure held x 5min. No bleeding to site. Pt instructed to keep dressing CDI x 24 hours. Avoid heavy lifting, pushing or pulling x 24 hours,  If bleeding occurs hold pressure, if bleeding does not stop contact MD or go to the ED. Pt does not have any questions. Kashlyn Salinas M 
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
## 2011-02-13 NOTE — Progress Notes (Signed)
Passing gas, tolerated fulls Patient examined and I agree with the assessment and plan  Violeta Gelinas, MD, MPH, FACS Pager: [phone]  02/13/2011 10:09 AM

## 2011-02-14 ENCOUNTER — Inpatient Hospital Stay (HOSPITAL_COMMUNITY)
Admission: EM | Admit: 2011-02-14 | Discharge: 2011-03-27 | DRG: 329 | Disposition: A | Discharge status: Home Health | Payer: Medicaid Other | Attending: General Surgery | Admitting: General Surgery

## 2011-02-14 ENCOUNTER — Other Ambulatory Visit: Payer: Self-pay

## 2011-02-14 ENCOUNTER — Emergency Department (HOSPITAL_COMMUNITY): Payer: Medicaid Other

## 2011-02-14 ENCOUNTER — Encounter (HOSPITAL_COMMUNITY): Payer: Self-pay | Admitting: *Deleted

## 2011-02-14 DIAGNOSIS — D62 Acute posthemorrhagic anemia: Secondary | ICD-10-CM

## 2011-02-14 DIAGNOSIS — R197 Diarrhea, unspecified: Secondary | ICD-10-CM | POA: Diagnosis not present

## 2011-02-14 DIAGNOSIS — R112 Nausea with vomiting, unspecified: Secondary | ICD-10-CM

## 2011-02-14 DIAGNOSIS — K565 Intestinal adhesions [bands], unspecified as to partial versus complete obstruction: Secondary | ICD-10-CM | POA: Diagnosis present

## 2011-02-14 DIAGNOSIS — I519 Heart disease, unspecified: Secondary | ICD-10-CM | POA: Diagnosis not present

## 2011-02-14 DIAGNOSIS — I498 Other specified cardiac arrhythmias: Secondary | ICD-10-CM | POA: Diagnosis not present

## 2011-02-14 DIAGNOSIS — I471 Supraventricular tachycardia, unspecified: Secondary | ICD-10-CM

## 2011-02-14 DIAGNOSIS — J90 Pleural effusion, not elsewhere classified: Secondary | ICD-10-CM | POA: Diagnosis not present

## 2011-02-14 DIAGNOSIS — J189 Pneumonia, unspecified organism: Secondary | ICD-10-CM | POA: Diagnosis not present

## 2011-02-14 DIAGNOSIS — S0180XA Unspecified open wound of other part of head, initial encounter: Secondary | ICD-10-CM | POA: Diagnosis present

## 2011-02-14 DIAGNOSIS — S92109A Unspecified fracture of unspecified talus, initial encounter for closed fracture: Secondary | ICD-10-CM | POA: Diagnosis present

## 2011-02-14 DIAGNOSIS — S82899A Other fracture of unspecified lower leg, initial encounter for closed fracture: Secondary | ICD-10-CM | POA: Diagnosis present

## 2011-02-14 DIAGNOSIS — K910 Vomiting following gastrointestinal surgery: Secondary | ICD-10-CM

## 2011-02-14 DIAGNOSIS — R0789 Other chest pain: Secondary | ICD-10-CM | POA: Insufficient documentation

## 2011-02-14 DIAGNOSIS — Z9889 Other specified postprocedural states: Secondary | ICD-10-CM

## 2011-02-14 DIAGNOSIS — R1310 Dysphagia, unspecified: Secondary | ICD-10-CM

## 2011-02-14 DIAGNOSIS — R000 Tachycardia, unspecified: Secondary | ICD-10-CM

## 2011-02-14 DIAGNOSIS — Z8619 Personal history of other infectious and parasitic diseases: Secondary | ICD-10-CM

## 2011-02-14 DIAGNOSIS — K929 Disease of digestive system, unspecified: Principal | ICD-10-CM | POA: Diagnosis present

## 2011-02-14 DIAGNOSIS — S36899A Unspecified injury of other intra-abdominal organs, initial encounter: Secondary | ICD-10-CM | POA: Diagnosis present

## 2011-02-14 DIAGNOSIS — R066 Hiccough: Secondary | ICD-10-CM | POA: Diagnosis not present

## 2011-02-14 DIAGNOSIS — R110 Nausea: Secondary | ICD-10-CM | POA: Diagnosis not present

## 2011-02-14 DIAGNOSIS — K651 Peritoneal abscess: Secondary | ICD-10-CM | POA: Diagnosis not present

## 2011-02-14 DIAGNOSIS — B356 Tinea cruris: Secondary | ICD-10-CM | POA: Diagnosis present

## 2011-02-14 DIAGNOSIS — R42 Dizziness and giddiness: Secondary | ICD-10-CM | POA: Diagnosis not present

## 2011-02-14 DIAGNOSIS — Z87828 Personal history of other (healed) physical injury and trauma: Secondary | ICD-10-CM

## 2011-02-14 DIAGNOSIS — D72829 Elevated white blood cell count, unspecified: Secondary | ICD-10-CM

## 2011-02-14 DIAGNOSIS — K56600 Partial intestinal obstruction, unspecified as to cause: Secondary | ICD-10-CM | POA: Diagnosis present

## 2011-02-14 DIAGNOSIS — Z9089 Acquired absence of other organs: Secondary | ICD-10-CM

## 2011-02-14 DIAGNOSIS — S82891A Other fracture of right lower leg, initial encounter for closed fracture: Secondary | ICD-10-CM | POA: Diagnosis present

## 2011-02-14 DIAGNOSIS — S92101A Unspecified fracture of right talus, initial encounter for closed fracture: Secondary | ICD-10-CM | POA: Diagnosis present

## 2011-02-14 DIAGNOSIS — J988 Other specified respiratory disorders: Secondary | ICD-10-CM | POA: Diagnosis not present

## 2011-02-14 DIAGNOSIS — K560 Paralytic ileus: Secondary | ICD-10-CM | POA: Diagnosis not present

## 2011-02-14 DIAGNOSIS — F17200 Nicotine dependence, unspecified, uncomplicated: Secondary | ICD-10-CM | POA: Diagnosis present

## 2011-02-14 HISTORY — DX: Unspecified viral hepatitis C without hepatic coma: B19.20

## 2011-02-14 HISTORY — DX: Tinea cruris: B35.6

## 2011-02-14 LAB — CBC
HCT: 41.2 % (ref 39.0–52.0)
Hemoglobin: 14.1 g/dL (ref 13.0–17.0)
MCH: 29.8 pg (ref 26.0–34.0)
MCV: 87.1 fL (ref 78.0–100.0)
Platelets: 508 10*3/uL — ABNORMAL HIGH (ref 150–400)
RBC: 4.73 MIL/uL (ref 4.22–5.81)
WBC: 21.5 10*3/uL — ABNORMAL HIGH (ref 4.0–10.5)

## 2011-02-14 LAB — URINALYSIS, ROUTINE W REFLEX MICROSCOPIC
Glucose, UA: NEGATIVE mg/dL
Hgb urine dipstick: NEGATIVE
Ketones, ur: >80 mg/dL — AB
Protein, ur: 30 mg/dL — AB
Urobilinogen, UA: 2 mg/dL — ABNORMAL HIGH (ref 0.0–1.0)

## 2011-02-14 LAB — COMPREHENSIVE METABOLIC PANEL
ALT: 52 U/L (ref 0–53)
Alkaline Phosphatase: 111 U/L (ref 39–117)
BUN: 12 mg/dL (ref 6–23)
CO2: 24 mEq/L (ref 19–32)
Calcium: 10.2 mg/dL (ref 8.4–10.5)
GFR calc Af Amer: >90 mL/min (ref >90)
GFR calc non Af Amer: >90 mL/min (ref >90)
Glucose, Bld: 135 mg/dL — ABNORMAL HIGH (ref 70–99)
Sodium: 137 mEq/L (ref 135–145)
Total Protein: 8.2 g/dL (ref 6.0–8.3)

## 2011-02-14 LAB — LIPASE, BLOOD: Lipase: 106 U/L — ABNORMAL HIGH (ref 11–59)

## 2011-02-14 LAB — URINE MICROSCOPIC-ADD ON

## 2011-02-14 LAB — DIFFERENTIAL
Eosinophils Absolute: 0 10*3/uL (ref 0.0–0.7)
Eosinophils Relative: 0 % (ref 0–5)
Lymphocytes Relative: 10 % — ABNORMAL LOW (ref 12–46)
Lymphs Abs: 2.1 10*3/uL (ref 0.7–4.0)
Monocytes Relative: 8 % (ref 3–12)

## 2011-02-14 MED ORDER — POTASSIUM CHLORIDE IN NACL 20-0.45 MEQ/L-% IV SOLN
INTRAVENOUS | Status: DC
  Administered 2011-02-14 – 2011-02-21 (×9): via INTRAVENOUS
  Administered 2011-02-22: 1000 mL via INTRAVENOUS
  Administered 2011-02-23 – 2011-02-27 (×2): 20 mL/h via INTRAVENOUS
  Administered 2011-03-07: 20 mL via INTRAVENOUS
  Administered 2011-03-09: 12:00:00 via INTRAVENOUS
  Administered 2011-03-11: 20 mL/h via INTRAVENOUS
  Administered 2011-03-16: 10:00:00 via INTRAVENOUS
  Administered 2011-03-19: 20 mL/h via INTRAVENOUS
  Filled 2011-02-14 (×36): qty 1000

## 2011-02-14 MED ORDER — DIPHENHYDRAMINE HCL 50 MG/ML IJ SOLN: 12.5000 mg | Freq: Four times a day (QID) | INTRAMUSCULAR | Status: DC | PRN

## 2011-02-14 MED ORDER — SODIUM CHLORIDE 0.9 % IJ SOLN: 9.0000 mL | INTRAMUSCULAR | Status: DC | PRN

## 2011-02-14 MED ORDER — HYDROMORPHONE 0.3 MG/ML IV SOLN
INTRAVENOUS | Status: AC
  Administered 2011-02-14: 23:00:00
  Filled 2011-02-14: qty 25

## 2011-02-14 MED ORDER — SODIUM CHLORIDE 0.9 % IV BOLUS (SEPSIS)
1000.0000 mL | Freq: Once | INTRAVENOUS | Status: AC
  Administered 2011-02-14: 1000 mL via INTRAVENOUS

## 2011-02-14 MED ORDER — ONDANSETRON HCL 4 MG/2ML IJ SOLN
INTRAMUSCULAR | Status: AC
  Filled 2011-02-14: qty 2

## 2011-02-14 MED ORDER — IOHEXOL 300 MG/ML  SOLN: 20.0000 mL | Freq: Once | INTRAMUSCULAR | Status: DC | PRN

## 2011-02-14 MED ORDER — DIPHENHYDRAMINE HCL 12.5 MG/5ML PO ELIX
12.5000 mg | ORAL_SOLUTION | Freq: Four times a day (QID) | ORAL | Status: DC | PRN
  Filled 2011-02-14: qty 5

## 2011-02-14 MED ORDER — POLYVINYL ALCOHOL 1.4 % OP SOLN
1.0000 [drp] | Freq: Four times a day (QID) | OPHTHALMIC | Status: DC
  Administered 2011-02-14 – 2011-03-27 (×142): 1 [drp] via OPHTHALMIC
  Filled 2011-02-14 (×3): qty 15

## 2011-02-14 MED ORDER — ONDANSETRON HCL 4 MG/2ML IJ SOLN
INTRAMUSCULAR | Status: AC
  Administered 2011-02-14: 4 mg via INTRAVENOUS
  Filled 2011-02-14: qty 2

## 2011-02-14 MED ORDER — NALOXONE HCL 0.4 MG/ML IJ SOLN: 0.4000 mg | INTRAMUSCULAR | Status: DC | PRN

## 2011-02-14 MED ORDER — ONDANSETRON HCL 4 MG/2ML IJ SOLN
4.0000 mg | Freq: Once | INTRAMUSCULAR | Status: AC
  Administered 2011-02-14: 4 mg via INTRAVENOUS

## 2011-02-14 MED ORDER — HYDROMORPHONE HCL PF 2 MG/ML IJ SOLN
INTRAMUSCULAR | Status: AC
  Filled 2011-02-14: qty 1

## 2011-02-14 MED ORDER — HYDROMORPHONE 0.3 MG/ML IV SOLN
INTRAVENOUS | Status: DC
  Administered 2011-02-14: via INTRAVENOUS
  Administered 2011-02-15: 3.9 mg via INTRAVENOUS

## 2011-02-14 MED ORDER — SODIUM CHLORIDE 0.9 % IV SOLN: 3.0000 g | Freq: Four times a day (QID) | INTRAVENOUS | Status: DC

## 2011-02-14 MED ORDER — SODIUM CHLORIDE 0.9 % IV SOLN
INTRAVENOUS | Status: DC
  Administered 2011-02-14: 16:00:00 via INTRAVENOUS

## 2011-02-14 MED ORDER — HYDROMORPHONE HCL PF 2 MG/ML IJ SOLN
2.0000 mg | Freq: Once | INTRAMUSCULAR | Status: AC
  Administered 2011-02-14: 2 mg via INTRAVENOUS
  Filled 2011-02-14: qty 1

## 2011-02-14 MED ORDER — FAMOTIDINE IN NACL 20-0.9 MG/50ML-% IV SOLN
20.0000 mg | Freq: Once | INTRAVENOUS | Status: AC
  Administered 2011-02-14: 20 mg via INTRAVENOUS
  Filled 2011-02-14: qty 50

## 2011-02-14 MED ORDER — SODIUM CHLORIDE 0.9 % IV SOLN
1.0000 g | INTRAVENOUS | Status: DC
  Filled 2011-02-14: qty 1

## 2011-02-14 MED ORDER — SODIUM CHLORIDE 0.9 % IV SOLN
1.0000 g | INTRAVENOUS | Status: AC
  Administered 2011-02-14: 1 g via INTRAVENOUS
  Filled 2011-02-14: qty 1

## 2011-02-14 MED ORDER — HYDROMORPHONE HCL PF 2 MG/ML IJ SOLN
2.0000 mg | Freq: Once | INTRAMUSCULAR | Status: AC
  Administered 2011-02-14: 2 mg via INTRAVENOUS

## 2011-02-14 MED ORDER — OXYMETAZOLINE HCL 0.05 % NA SOLN
NASAL | Status: AC
  Filled 2011-02-14: qty 15

## 2011-02-14 MED ORDER — IOHEXOL 300 MG/ML  SOLN
100.0000 mL | Freq: Once | INTRAMUSCULAR | Status: AC | PRN
  Administered 2011-02-14: 100 mL via INTRAVENOUS

## 2011-02-14 MED ORDER — ONDANSETRON HCL 4 MG/2ML IJ SOLN
4.0000 mg | Freq: Four times a day (QID) | INTRAMUSCULAR | Status: DC | PRN
  Administered 2011-02-23: 4 mg via INTRAVENOUS
  Filled 2011-02-14: qty 2

## 2011-02-14 MED ORDER — LIDOCAINE HCL 2 % EX GEL
CUTANEOUS | Status: AC
  Filled 2011-02-14: qty 20

## 2011-02-14 NOTE — H&P (Signed)
Abdominal pain now resolved after pain meds and he is not very tender.  I am concerned he has an abscess or other complication.  Will check CT. Patient examined and I agree with the assessment and plan  Violeta Gelinas, MD, MPH, FACS Pager: [phone]  02/14/2011 4:33 PM

## 2011-02-14 NOTE — H&P (Signed)
Craig Garrett is an 51 y.o. male.   Chief Complaint: Abdominal Pain HPI: Patient did well after discharge yesterday until about 2300 when he developed acute onset anorexia and nausea. Had emesis x3 then developed severe generalized abdominal pain that has persisted until present. Had one further episode of emesis on arrival to the hospital. Denies fevers and chills. One loose bowel movement this morning.  Past Medical History  Diagnosis Date  . Arthritis   . Jock itch     Past Surgical History  Procedure Date  . Left wrist surgery   . Laparotomy 02/06/2011    Procedure: EXPLORATORY LAPAROTOMY;  Surgeon: Jetty Duhamel, MD;  Location: [facility] OR;  Service: General;  Laterality: N/A;  Exploratory laporatomy, small bowel resection, and incidental appendectomy  . Bowel resection 02/06/2011    Procedure: SMALL BOWEL RESECTION;  Surgeon: Jetty Duhamel, MD;  Location: [facility] OR;  Service: General;;  Exploratory laporatomy, small bowel resection, and incidental appendectomy  . Appendectomy 02/06/2011    Procedure: APPENDECTOMY;  Surgeon: Jetty Duhamel, MD;  Location: [facility] OR;  Service: General;  Laterality: N/A;  Exploratory laporatomy, small bowel resection, and incidental appendectomy  . Laceration repair 02/06/2011    Procedure: REPAIR MULTIPLE LACERATIONS;  Surgeon: Darletta Moll, MD;  Location: [facility] OR;  Service: ENT;  Laterality: Right;    No family history on file. Social History:  reports that he has been smoking Cigarettes.  He has a 7.5 pack-year smoking history. He has never used smokeless tobacco. He reports that he drinks alcohol. He reports that he uses illicit drugs (Marijuana).  Allergies: No Known Allergies  Medications Prior to Admission  Medication Dose Route Frequency Provider Last Rate Last Dose  . 0.9 %  sodium chloride infusion   Intravenous Continuous Felisa Bonier, MD 125 mL/hr at 02/14/11 1532    . Ampicillin-Sulbactam (UNASYN) 3 g in sodium chloride 0.9 % 100 mL IVPB  3 g  Intravenous Q6H [name], PA      . diphenhydrAMINE (BENADRYL) injection 12.5 mg  12.5 mg Intravenous Q6H PRN [name], PA       Or  . diphenhydrAMINE (BENADRYL) 12.5 MG/5ML elixir 12.5 mg  12.5 mg Oral Q6H PRN [name], PA      . famotidine (PEPCID) IVPB 20 mg  20 mg Intravenous Once Felisa Bonier, MD   20 mg at 02/14/11 1529  . HYDROmorphone (DILAUDID) injection 2 mg  2 mg Intravenous Once Felisa Bonier, MD   2 mg at 02/14/11 1522  . HYDROmorphone (DILAUDID) PCA injection 0.3 mg/mL   Intravenous Q4H [name], PA      . iohexol (OMNIPAQUE) 300 MG/ML solution 20 mL  20 mL Oral Once PRN Medication Radiologist, MD      . naloxone [facility]) injection 0.4 mg  0.4 mg Intravenous PRN [name], PA       And  . sodium chloride 0.9 % injection 9 mL  9 mL Intravenous PRN [name], PA      . ondansetron [facility]) injection 4 mg  4 mg Intravenous Once Felisa Bonier, MD   4 mg at 02/14/11 1522  . ondansetron (ZOFRAN) injection 4 mg  4 mg Intravenous Q6H PRN [name], PA      . sodium chloride 0.9 % bolus 1,000 mL  1,000 mL Intravenous Once Felisa Bonier, MD   1,000 mL at 02/14/11 1528  . DISCONTD:  0.45 % NaCl with KCl 20 mEq / L infusion   Intravenous Continuous [name], PA 100 mL/hr at 02/13/11 0350    . DISCONTD: antiseptic oral rinse (BIOTENE) solution 15 mL  15 mL Mouth Rinse q12n4p Cherylynn Ridges III, MD   15 mL at 02/12/11 1600  . DISCONTD: bacitracin ointment   Topical BID [name], PA      . DISCONTD: chlorhexidine (PERIDEX) 0.12 % solution 15 mL  15 mL Mouth Rinse BID Cherylynn Ridges III, MD   15 mL at 02/13/11 0858  . DISCONTD: diphenhydrAMINE (BENADRYL) 12.5 MG/5ML elixir 12.5 mg  12.5 mg Oral Q6H PRN [name], PA      . DISCONTD: diphenhydrAMINE (BENADRYL) injection 12.5 mg  12.5 mg Intravenous Q6H PRN [name], PA   12.5 mg at 02/08/11 1448  . DISCONTD: enoxaparin (LOVENOX)  injection 30 mg  30 mg Subcutaneous Q12H [name], PA   30 mg at 02/13/11 0900  . DISCONTD: naloxone [facility]) injection 0.4 mg  0.4 mg Intravenous PRN [name], PA      . DISCONTD: ondansetron [facility]) injection 4 mg  4 mg Intravenous Q6H PRN [name], PA   4 mg at 02/09/11 1829  . DISCONTD: oxyCODONE-acetaminophen (PERCOCET) 5-325 MG per tablet 1-2 tablet  1-2 tablet Oral Q4H PRN Letha Cape, PA   2 tablet at 02/13/11 0349  . DISCONTD: pantoprazole (PROTONIX) EC tablet 40 mg  40 mg Oral Q1200 [name], PA   40 mg at 02/12/11 1255  . DISCONTD: polyvinyl alcohol (LIQUIFILM TEARS) 1.4 % ophthalmic solution 1 drop  1 drop Right Eye QID Shawn Rayburn, PA   1 drop at 02/13/11 1609  . DISCONTD: sodium chloride 0.9 % injection 10-40 mL  10-40 mL Intracatheter PRN Cherylynn Ridges III, MD      . DISCONTD: sodium chloride 0.9 % injection 9 mL  9 mL Intravenous PRN [name], PA       Medications Prior to Admission  Medication Sig Dispense Refill  . oxyCODONE-acetaminophen (PERCOCET) 5-325 MG per tablet Take 1-2 tablets by mouth every 4 (four) hours as needed. For pain      . polyvinyl alcohol (LIQUIFILM TEARS) 1.4 % ophthalmic solution Place 1 drop into the right eye 4 (four) times daily.  15 mL  1    Results for orders placed during the hospital encounter of 02/14/11 (from the past 48 hour(s))  CBC     Status: Abnormal   Collection Time   02/14/11  3:10 PM      Component Value Range Comment   WBC 21.5 (*) 4.0 - 10.5 (K/uL)    RBC 4.73  4.22 - 5.81 (MIL/uL)    Hemoglobin 14.1  13.0 - 17.0 (g/dL)    HCT 16.1  09.6 - 04.5 (%)    MCV 87.1  78.0 - 100.0 (fL)    MCH 29.8  26.0 - 34.0 (pg)    MCHC 34.2  30.0 - 36.0 (g/dL)    RDW 40.9  81.1 - 91.4 (%)    Platelets 508 (*) 150 - 400 (K/uL)   DIFFERENTIAL     Status: Abnormal   Collection Time   02/14/11  3:10 PM      Component Value Range Comment   Neutrophils Relative 82 (*) 43 - 77 (%)    Neutro Abs  17.7 (*) 1.7 - 7.7 (K/uL)    Lymphocytes Relative 10 (*)  12 - 46 (%)    Lymphs Abs 2.1  0.7 - 4.0 (K/uL)    Monocytes Relative 8  3 - 12 (%)    Monocytes Absolute 1.7 (*) 0.1 - 1.0 (K/uL)    Eosinophils Relative 0  0 - 5 (%)    Eosinophils Absolute 0.0  0.0 - 0.7 (K/uL)    Basophils Relative 0  0 - 1 (%)    Basophils Absolute 0.0  0.0 - 0.1 (K/uL)   COMPREHENSIVE METABOLIC PANEL     Status: Abnormal   Collection Time   02/14/11  3:10 PM      Component Value Range Comment   Sodium 137  135 - 145 (mEq/L)    Potassium 4.4  3.5 - 5.1 (mEq/L)    Chloride 97  96 - 112 (mEq/L)    CO2 24  19 - 32 (mEq/L)    Glucose, Bld 135 (*) 70 - 99 (mg/dL)    BUN 12  6 - 23 (mg/dL)    Creatinine, Ser 9.60  0.50 - 1.35 (mg/dL)    Calcium 45.4  8.4 - 10.5 (mg/dL)    Total Protein 8.2  6.0 - 8.3 (g/dL)    Albumin 3.6  3.5 - 5.2 (g/dL)    AST 61 (*) 0 - 37 (U/L)    ALT 52  0 - 53 (U/L)    Alkaline Phosphatase 111  39 - 117 (U/L)    Total Bilirubin 0.6  0.3 - 1.2 (mg/dL)    GFR calc non Af Amer >90  >90 (mL/min)    GFR calc Af Amer >90  >90 (mL/min)   LIPASE, BLOOD     Status: Abnormal   Collection Time   02/14/11  3:10 PM      Component Value Range Comment   Lipase 106 (*) 11 - 59 (U/L)    No results found.  Review of Systems  Constitutional: Positive for chills. Negative for fever and diaphoresis.  Gastrointestinal: Positive for nausea, vomiting, abdominal pain and diarrhea.    Blood pressure 144/93, pulse 110, temperature 98.2 F (36.8 C), temperature source Oral, resp. rate 18, height 5\' 11"  (1.803 m), weight 85.73 kg (189 lb), SpO2 99.00%. Physical Exam  Constitutional: He appears well-developed and well-nourished.  HENT:  Head: Normocephalic.  Eyes: Pupils are equal, round, and reactive to light.  Cardiovascular: Regular rhythm.  Tachycardia present.  Exam reveals no gallop and no friction rub.   No murmur heard. Respiratory: Effort normal and breath sounds normal. No respiratory  distress. He has no wheezes. He has no rales. He exhibits no tenderness.  GI: Soft. He exhibits no distension. Bowel sounds are decreased. There is generalized tenderness. There is guarding. There is no rigidity.    Genitourinary: Penis normal.  Lymphadenopathy:    He has no cervical adenopathy.  Neurological: He is alert.  Skin: Skin is warm and dry.     Assessment/Plan Abdominal pain -- Ct abd/pelvis pending. Suspect abscess, though leak or obstruction possible. Have started on antibiotics and will await results of CT Right ankle/foot fx   Shivonne Schwartzman J. 02/14/2011, 4:19 PM

## 2011-02-14 NOTE — Discharge Summary (Signed)
Craig Groleau, MD, MPH, FACS Pager: [phone]  

## 2011-02-14 NOTE — ED Provider Notes (Signed)
History     CSN: [id number]  Arrival date & time 02/14/11  1426   First MD Initiated Contact with Patient 02/14/11 1505      No chief complaint on file.   (Consider location/radiation/quality/duration/timing/severity/associated sxs/prior treatment) HPI  Past Medical History  Diagnosis Date  . Arthritis   . Jock itch     Past Surgical History  Procedure Date  . Left wrist surgery   . Laparotomy 02/06/2011    Procedure: EXPLORATORY LAPAROTOMY;  Surgeon: Jetty Duhamel, MD;  Location: [facility] OR;  Service: General;  Laterality: N/A;  Exploratory laporatomy, small bowel resection, and incidental appendectomy  . Bowel resection 02/06/2011    Procedure: SMALL BOWEL RESECTION;  Surgeon: Jetty Duhamel, MD;  Location: [facility] OR;  Service: General;;  Exploratory laporatomy, small bowel resection, and incidental appendectomy  . Appendectomy 02/06/2011    Procedure: APPENDECTOMY;  Surgeon: Jetty Duhamel, MD;  Location: [facility] OR;  Service: General;  Laterality: N/A;  Exploratory laporatomy, small bowel resection, and incidental appendectomy  . Laceration repair 02/06/2011    Procedure: REPAIR MULTIPLE LACERATIONS;  Surgeon: Darletta Moll, MD;  Location: [facility] OR;  Service: ENT;  Laterality: Right;    No family history on file.  History  Substance Use Topics  . Smoking status: Current Everyday Smoker -- 0.5 packs/day for 15 years    Types: Cigarettes  . Smokeless tobacco: Never Used  . Alcohol Use: Yes      Review of Systems  Allergies  Review of patient's allergies indicates no known allergies.  Home Medications   Current Outpatient Rx  Name Route Sig Dispense Refill  . OXYCODONE-ACETAMINOPHEN 5-325 MG PO TABS Oral Take 1-2 tablets by mouth every 4 (four) hours as needed. 60 tablet 0  . POLYVINYL ALCOHOL 1.4 % OP SOLN Right Eye Place 1 drop into the right eye 4 (four) times daily. 15 mL 1    Temp(Src) 98.5 F (36.9 C) (Oral)  Physical Exam  ED Course  Procedures (including  critical care time)  Labs Reviewed - No data to display No results found.   No diagnosis found.    MDM  Patient not seen by me. Not my patient        Doug Sou, MD 02/15/11 [phone]
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
## 2011-02-14 NOTE — Progress Notes (Signed)
Patient ID: Craig Garrett, male   DOB: March 23, 1960, 51 y.o.   MRN: [id number] CT shows SBO.  No abscess or free air.  Will place NGT and hydrate with IVF.  I discussed the plan with the patient.  We also discussed the possibility of surgery if he does not improve.  Violeta Gelinas, MD, MPH, FACS Pager: [phone]

## 2011-02-15 DIAGNOSIS — K56600 Partial intestinal obstruction, unspecified as to cause: Secondary | ICD-10-CM | POA: Diagnosis present

## 2011-02-15 DIAGNOSIS — D72829 Elevated white blood cell count, unspecified: Secondary | ICD-10-CM

## 2011-02-15 DIAGNOSIS — R000 Tachycardia, unspecified: Secondary | ICD-10-CM

## 2011-02-15 LAB — BASIC METABOLIC PANEL
BUN: 11 mg/dL (ref 6–23)
Creatinine, Ser: 0.79 mg/dL (ref 0.50–1.35)
GFR calc Af Amer: >90 mL/min (ref >90)
GFR calc non Af Amer: >90 mL/min (ref >90)
Glucose, Bld: 108 mg/dL — ABNORMAL HIGH (ref 70–99)

## 2011-02-15 LAB — CBC
HCT: 37.8 % — ABNORMAL LOW (ref 39.0–52.0)
MCH: 29.2 pg (ref 26.0–34.0)
MCHC: 33.1 g/dL (ref 30.0–36.0)
MCV: 88.3 fL (ref 78.0–100.0)
RDW: 14.7 % (ref 11.5–15.5)

## 2011-02-15 MED ORDER — MORPHINE SULFATE (PF) 1 MG/ML IV SOLN
INTRAVENOUS | Status: DC
  Administered 2011-02-15: 12 mg via INTRAVENOUS
  Administered 2011-02-16: 15 mg via INTRAVENOUS
  Administered 2011-02-16 (×2): via INTRAVENOUS
  Administered 2011-02-16: 11.69 mg via INTRAVENOUS
  Administered 2011-02-16: 9 mg via INTRAVENOUS
  Administered 2011-02-16: 3 mg via INTRAVENOUS
  Administered 2011-02-16: 02:00:00 via INTRAVENOUS
  Administered 2011-02-17: 6 mg via INTRAVENOUS
  Administered 2011-02-17: 12 mg via INTRAVENOUS
  Administered 2011-02-17: 3 mg via INTRAVENOUS
  Administered 2011-02-17: 6.96 mg via INTRAVENOUS
  Administered 2011-02-17: 4.5 mg via INTRAVENOUS
  Administered 2011-02-17: 12 mg via INTRAVENOUS
  Administered 2011-02-18: 8.88 mg via INTRAVENOUS
  Administered 2011-02-18: 10.5 mg via INTRAVENOUS
  Administered 2011-02-18: 15 mg via INTRAVENOUS
  Administered 2011-02-18: 25 mg via INTRAVENOUS
  Administered 2011-02-19: 15:00:00 via INTRAVENOUS
  Administered 2011-02-19: 7.5 mg via INTRAVENOUS
  Administered 2011-02-19: 1.5 mg via INTRAVENOUS
  Administered 2011-02-20: 3 mg via INTRAVENOUS

## 2011-02-15 MED ORDER — MORPHINE SULFATE (PF) 1 MG/ML IV SOLN
INTRAVENOUS | Status: AC
  Filled 2011-02-15: qty 25

## 2011-02-15 MED ORDER — WHITE PETROLATUM GEL
Status: AC
  Filled 2011-02-15: qty 5

## 2011-02-15 MED ORDER — ENOXAPARIN SODIUM 40 MG/0.4ML ~~LOC~~ SOLN
40.0000 mg | Freq: Every day | SUBCUTANEOUS | Status: DC
  Administered 2011-02-15 – 2011-02-26 (×11): 40 mg via SUBCUTANEOUS
  Filled 2011-02-15 (×12): qty 0.4
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
## 2011-02-15 NOTE — Progress Notes (Signed)
UR of chart complete.  

## 2011-02-15 NOTE — Progress Notes (Signed)
Do not necessarily agree with PCA .  With the level of SBO seen on his CT I would expect a lot more out of his NGT.  Will check to see why it is not decompressing as well as could be expected.  This patient has been seen and I agree with the findings and treatment plan.  Marta Lamas. Gae Bon, MD, FACS [phone] (pager) [phone] (direct pager) Trauma Surgeon

## 2011-02-16 ENCOUNTER — Encounter (INDEPENDENT_AMBULATORY_CARE_PROVIDER_SITE_OTHER): Payer: Self-pay

## 2011-02-16 MED ORDER — BACITRACIN ZINC 500 UNIT/GM EX OINT
TOPICAL_OINTMENT | Freq: Two times a day (BID) | CUTANEOUS | Status: DC
  Administered 2011-02-16 – 2011-02-17 (×3): via TOPICAL
  Administered 2011-02-17: 1 via TOPICAL
  Administered 2011-02-18: 11:00:00 via TOPICAL
  Administered 2011-02-18: 1 via TOPICAL
  Administered 2011-02-19 – 2011-02-21 (×5): via TOPICAL
  Filled 2011-02-16: qty 15

## 2011-02-16 MED ORDER — MORPHINE SULFATE (PF) 1 MG/ML IV SOLN
INTRAVENOUS | Status: AC
  Filled 2011-02-16: qty 25

## 2011-02-16 NOTE — Progress Notes (Signed)
Physical Therapy Treatment Patient Details Name: Craig Garrett MRN: [id number] DOB: 04-May-1960 Today's Date: 02/16/2011  PT Assessment/Plan  PT - Assessment/Plan Comments on Treatment Session: Still a little heavy on the TDWB status, needing lots of cueing to keep him on track with WB.  Otherwise progressing well.  Distance limited by IV site in bend of wrist. PT Plan: Discharge plan remains appropriate PT Frequency: Min 5X/week Follow Up Recommendations: Outpatient PT Equipment Recommended: None recommended by PT PT Goals  Acute Rehab PT Goals PT Goal: Supine/Side to Sit - Progress: Progressing toward goal PT Goal: Sit to Stand - Progress: Progressing toward goal PT Goal: Stand to Sit - Progress: Progressing toward goal PT Goal: Ambulate - Progress: Progressing toward goal  PT Treatment Precautions/Restrictions  Precautions Precautions: Fall Precaution Comments: NGT Other Brace/Splint: air splint right ankle Restrictions Weight Bearing Restrictions: Yes RLE Weight Bearing: Touchdown weight bearing Mobility (including Balance) Bed Mobility Bed Mobility: Yes Supine to Sit: 5: Supervision Supine to Sit Details (indicate cue type and reason): cues to maintain NGT Sitting - Scoot to Edge of Bed: 5: Supervision Transfers Transfers: Yes Sit to Stand: 5: Supervision;From bed;With upper extremity assist Sit to Stand Details (indicate cue type and reason): cues for hand placement as pt. reached for RW with both hands Stand to Sit: 4: Min assist Stand to Sit Details: min guard assist to control descent Ambulation/Gait Ambulation/Gait: Yes Ambulation/Gait Assistance: 4: Min assist (min guard assist) Ambulation/Gait Assistance Details (indicate cue type and reason): pt. tends to put greater than TDWB unless frequently reminded by therapist Ambulation Distance (Feet): 30 Feet (limited by pain from IV at wrist) Assistive device: Rolling walker Gait Pattern: Step-to pattern    Exercise     End of Session PT - End of Session Equipment Utilized During Treatment: Gait belt (right air splint; RN disconnected from NG suction for walk) Activity Tolerance: Patient tolerated treatment well Patient left: in chair;with call bell in reach (right  LE on pillows) Nurse Communication: Mobility status for transfers;Mobility status for ambulation;Weight bearing status General Behavior During Session: [facility] for tasks performed Cognition: [facility] for tasks performed  Ferman Hamming 02/16/2011, 12:09 PM Acute Rehabilitation Services [phone] (pager)
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
## 2011-02-16 NOTE — Progress Notes (Signed)
Patient ID: Craig Garrett, male   DOB: [birth year], 51 y.o.   MRN: [id number]   LOS: 2 days   Subjective: Feels much better, thinks morphine is working better. No N/V. No flatus/BM.  Objective: Vital signs in last 24 hours: Temp:  [98.2 F (36.8 C)-98.7 F (37.1 C)] 98.2 F (36.8 C) (02/13 2103) Pulse Rate:  [103-107] 107  (02/13 2103) Resp:  [18-20] 18  (02/14 0354) BP: (122-133)/(81-84) 122/84 mmHg (02/13 2103) SpO2:  [94 %-96 %] 94 % (02/14 0354) Last BM Date: 02/14/11  NGT: 148ml/24h  General appearance: alert and no distress Resp: clear to auscultation bilaterally Cardio: regular rate and rhythm GI: normal findings: bowel sounds decreased and soft, minimally TTP. Incision C/D/I.  Assessment/Plan: PSBO -- Continue NGT, NPO.  Right ankle/foot fx -- Will let Dr. Carola Frost know patient is back in hospital. Continue PT/OT as this was suggested at d/c.  ABL anemia  FEN -- NPO  VTE -- Lovenox  Dispo -- PSBO    [name], PA-C Pager: [phone] General Trauma PA Pager: [phone]   02/16/2011

## 2011-02-16 NOTE — Progress Notes (Signed)
Feels stomach "growling". No flatus yet.  Patient examined and I agree with the assessment and plan  Violeta Gelinas, MD, MPH, FACS Pager: [phone]  02/16/2011 2:17 PM

## 2011-02-16 NOTE — Plan of Care (Signed)
Problem: Phase I Progression Outcomes Goal: Sutures/staples intact Outcome: Completed/Met Date Met:  02/16/11 Staples removed today

## 2011-02-17 ENCOUNTER — Inpatient Hospital Stay (HOSPITAL_COMMUNITY): Payer: Medicaid Other

## 2011-02-17 LAB — BASIC METABOLIC PANEL
BUN: 7 mg/dL (ref 6–23)
Calcium: 8.5 mg/dL (ref 8.4–10.5)
Creatinine, Ser: 0.82 mg/dL (ref 0.50–1.35)
GFR calc Af Amer: >90 mL/min (ref >90)

## 2011-02-17 MED ORDER — MORPHINE SULFATE (PF) 1 MG/ML IV SOLN
INTRAVENOUS | Status: AC
  Administered 2011-02-17: 11:00:00
  Filled 2011-02-17: qty 25

## 2011-02-17 MED ORDER — MORPHINE SULFATE (PF) 1 MG/ML IV SOLN
INTRAVENOUS | Status: AC
  Administered 2011-02-17: 20:00:00
  Filled 2011-02-17: qty 25

## 2011-02-17 NOTE — Progress Notes (Signed)
Physical Therapy Treatment Patient Details Name: Craig Garrett MRN: [id number] DOB: [birth year] Today's Date: 02/17/2011  PT Assessment/Plan  PT - Assessment/Plan Comments on Treatment Session: Pt. still al ittle heavy on the TDWB.  Have instructed pt. to continue gait as NWB. PT Plan: Discharge plan remains appropriate PT Frequency: Min 5X/week Equipment Recommended: None recommended by PT PT Goals  Acute Rehab PT Goals Time For Goal Achievement: 7 days PT Goal: Supine/Side to Sit - Progress: Progressing toward goal PT Goal: Sit to Stand - Progress: Progressing toward goal PT Goal: Stand to Sit - Progress: Progressing toward goal PT Goal: Ambulate - Progress: Progressing toward goal  PT Treatment Precautions/Restrictions  Precautions Precautions: Fall Precaution Comments: NGT clamped by RN Required Braces or Orthoses: Yes Other Brace/Splint: right ankle air splint Restrictions Weight Bearing Restrictions: Yes RLE Weight Bearing: Touchdown weight bearing Mobility (including Balance) Bed Mobility Bed Mobility: Yes Supine to Sit: 5: Supervision Sitting - Scoot to Edge of Bed: 5: Supervision Transfers Transfers: Yes Sit to Stand: 5: Supervision Sit to Stand Details (indicate cue type and reason): vc's for hand placement on bed Stand to Sit: 5: Supervision Stand to Sit Details: better control of descednt today Ambulation/Gait Ambulation/Gait: Yes Ambulation/Gait Assistance Details (indicate cue type and reason): pt. continues to need significant reinforcement for TDWB Ambulation Distance (Feet): 60 Feet Assistive device: Rolling walker Gait Pattern: Step-to pattern Stairs: No    Exercise    End of Session PT - End of Session Equipment Utilized During Treatment: Gait belt (right ankle air splint) Activity Tolerance: Patient tolerated treatment well;Patient limited by pain (still with some difficulty due to IV site in wrist) Patient left: in chair Nurse Communication:  Mobility status for transfers;Mobility status for ambulation;Weight bearing status General Behavior During Session: [facility] for tasks performed  Ferman Hamming 02/17/2011, 12:10 PM Acute Rehabilitation Services [phone] (pager)

## 2011-02-17 NOTE — Progress Notes (Signed)
NGT clamped for 4.5 hours.  Pt tolerated clamping with no complaints of nausea or vomiting.  No gastric residual obtained.  NG tube discontinued per MD order.  Pt tolerated well.  Clear liquid diet started.    Pt has complained of dizziness X2 today.  Blood pressure checked and stable 118/79 and 116/77.  Afebrile.  Pt used 6.96mg  of PCA morphine per last 6 hours.
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
## 2011-02-18 LAB — CBC
MCHC: 33.7 g/dL (ref 30.0–36.0)
MCV: 87.3 fL (ref 78.0–100.0)
Platelets: 442 10*3/uL — ABNORMAL HIGH (ref 150–400)
RDW: 14.1 % (ref 11.5–15.5)
WBC: 9 10*3/uL (ref 4.0–10.5)

## 2011-02-18 LAB — BASIC METABOLIC PANEL
BUN: 6 mg/dL (ref 6–23)
Chloride: 101 mEq/L (ref 96–112)
Creatinine, Ser: 0.76 mg/dL (ref 0.50–1.35)
GFR calc Af Amer: >90 mL/min (ref >90)
GFR calc non Af Amer: >90 mL/min (ref >90)
Potassium: 3.9 mEq/L (ref 3.5–5.1)

## 2011-02-18 MED ORDER — MORPHINE SULFATE (PF) 1 MG/ML IV SOLN
INTRAVENOUS | Status: AC
  Filled 2011-02-18: qty 25
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
## 2011-02-18 NOTE — Progress Notes (Signed)
Films show contrast in colon.  Would go slow for now.

## 2011-02-19 ENCOUNTER — Inpatient Hospital Stay (HOSPITAL_COMMUNITY): Payer: Medicaid Other

## 2011-02-19 LAB — BASIC METABOLIC PANEL WITH GFR
BUN: 3 mg/dL — ABNORMAL LOW (ref 6–23)
CO2: 28 meq/L (ref 19–32)
Calcium: 9.1 mg/dL (ref 8.4–10.5)
Chloride: 101 meq/L (ref 96–112)
Creatinine, Ser: 0.8 mg/dL (ref 0.50–1.35)
GFR calc Af Amer: >90 mL/min
GFR calc non Af Amer: >90 mL/min
Glucose, Bld: 106 mg/dL — ABNORMAL HIGH (ref 70–99)
Potassium: 4.1 meq/L (ref 3.5–5.1)
Sodium: 139 meq/L (ref 135–145)

## 2011-02-19 LAB — CBC
HCT: 35.1 % — ABNORMAL LOW (ref 39.0–52.0)
Hemoglobin: 11.2 g/dL — ABNORMAL LOW (ref 13.0–17.0)
MCH: 28.4 pg (ref 26.0–34.0)
MCHC: 31.9 g/dL (ref 30.0–36.0)
MCV: 88.9 fL (ref 78.0–100.0)
Platelets: 462 K/uL — ABNORMAL HIGH (ref 150–400)
RBC: 3.95 MIL/uL — ABNORMAL LOW (ref 4.22–5.81)
RDW: 14.2 % (ref 11.5–15.5)
WBC: 7.4 K/uL (ref 4.0–10.5)

## 2011-02-19 LAB — PREALBUMIN: Prealbumin: 15.5 mg/dL — ABNORMAL LOW (ref 17.0–34.0)

## 2011-02-19 MED ORDER — MORPHINE SULFATE (PF) 1 MG/ML IV SOLN
INTRAVENOUS | Status: AC
  Administered 2011-02-19: 6.9 mg
  Filled 2011-02-19: qty 25
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
## 2011-02-19 NOTE — Progress Notes (Signed)
Xrays look ok. No significant distension. Contrast progressing thru colon  I saw the patient, participated in the history, exam and medical decision making, and concur with the physician assistant's note above.  Mary Sella. Andrey Campanile, MD, FACS General, Bariatric, & Minimally Invasive Surgery [facility], [place]

## 2011-02-20 MED ORDER — ACETAMINOPHEN 325 MG PO TABS: 650.0000 mg | ORAL_TABLET | ORAL | Status: DC | PRN

## 2011-02-20 MED ORDER — HYDROCODONE-ACETAMINOPHEN 5-325 MG PO TABS
1.0000 | ORAL_TABLET | Freq: Four times a day (QID) | ORAL | Status: DC | PRN
  Administered 2011-02-20 – 2011-02-21 (×4): 2 via ORAL
  Filled 2011-02-20 (×4): qty 2

## 2011-02-20 NOTE — Progress Notes (Signed)
Slow improvement.   Advance diet.
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
## 2011-02-20 NOTE — Progress Notes (Signed)
Patient ID: Craig Garrett, male   DOB: 07-10-1960, 51 y.o.   MRN: [id number]   Subjective: Pt had BM this am and is having some crampy abd pain following this, but overall feels abd is much less bloated.   Objective: Vital signs in last 24 hours: Temp:  [97 F (36.1 C)-98.8 F (37.1 C)] 97.6 F (36.4 C) (02/18 0644) Pulse Rate:  [83-88] 87  (02/18 0644) Resp:  [18-22] 18  (02/18 0830) BP: (102-106)/(71-77) 106/74 mmHg (02/18 0644) SpO2:  [94 %-96 %] 94 % (02/18 0644) Last BM Date: 02/15/11  Intake/Output from previous day: 01-Mar-2022 0701 - 02/18 0700 In: 3600 [P.O.:800; I.V.:2800] Out: 1000 [Urine:1000] Intake/Output this shift:    General appearance: alert and cooperative, moderate distress Resp: clear to auscultation bilaterally GI: + BS, non distended, soft  Lab Results: CBC   Basename 2011/03/02 0635 02/18/11 0600  WBC 7.4 9.0  HGB 11.2* 11.4*  HCT 35.1* 33.8*  PLT 462* 442*   BMET  Basename 2011-03-02 0635 02/18/11 0600  NA 139 137  K 4.1 3.9  CL 101 101  CO2 28 28  GLUCOSE 106* 90  BUN 3* 6  CREATININE 0.80 0.76  CALCIUM 9.1 8.9   PT/INR No results found for this basename: LABPROT:2,INR:2 in the last 72 hours ABG No results found for this basename: PHART:2,PCO2:2,PO2:2,HCO3:2 in the last 72 hours  Studies/Results: Dg Abd 2 Views  2011/03/02  *RADIOLOGY REPORT*  Clinical Data: No bowel movement for 5 days.  ABDOMEN - 2 VIEW  Comparison: Abdominal radiograph 02/17/2011 and CT abdomen pelvis 02/14/2011  Findings:  Plate-like atelectasis at the lung bases bilaterally.  No evidence of free intraperitoneal air.  Some of the oral contrast has progressed into the distal colon since the radiograph of 02/17/2011.  Some contrast remains in the proximal colon.  Small bowel dilatation in the left abdomen has decreased but not completely resolved compared to the recent study of 02/17/2011. Small bowel loops measure up to approximately 3.7 cm.  Borderline prominent small bowel  loop in the right lower quadrant.  There are scattered air-fluid levels throughout the small bowel loops.  IMPRESSION: 1. Persistent partial small bowel obstruction pattern.  Small bowel dilatation appears slightly decreased compared to 02/17/2011. 2.  Bibasilar atelectasis.  Original Report Authenticated By: Britta Mccreedy, M.D.    Anti-infectives: Anti-infectives     Start     Dose/Rate Route Frequency Ordered Stop   02/15/11 1600   ertapenem (INVANZ) 1 g in sodium chloride 0.9 % 50 mL IVPB  Status:  Discontinued        1 g 100 mL/hr over 30 Minutes Intravenous Every 24 hours 02/14/11 1634 02/15/11 0813   02/14/11 1715   ertapenem (INVANZ) 1 g in sodium chloride 0.9 % 50 mL IVPB        1 g 100 mL/hr over 30 Minutes Intravenous [facility] 02/14/11 1709 02/14/11 1753   02/14/11 1615   Ampicillin-Sulbactam (UNASYN) 3 g in sodium chloride 0.9 % 100 mL IVPB  Status:  Discontinued        3 g 100 mL/hr over 60 Minutes Intravenous Every 6 hours 02/14/11 1603 02/14/11 1634          Assessment/Plan: PSBO --appears to be clearing some, Will advance to Full liquids and change to po pain meds Right ankle/foot fx -- Dr. Carola Frost ABL anemia  FEN --Advance to fulls decrease MIVF VTE -- Lovenox  Dispo -- Hopefully can advance to regular diet by tomorrow  and home tomorrow if tolerates well.   LOS: 6 days    Avaleen Brownley,PA-C Pager [phone] General Trauma Pager [phone]

## 2011-02-21 ENCOUNTER — Inpatient Hospital Stay (HOSPITAL_COMMUNITY): Payer: Medicaid Other

## 2011-02-21 MED ORDER — MORPHINE SULFATE 2 MG/ML IJ SOLN
2.0000 mg | INTRAMUSCULAR | Status: DC | PRN
  Administered 2011-02-21 (×2): 4 mg via INTRAVENOUS
  Filled 2011-02-21 (×2): qty 2

## 2011-02-21 NOTE — Progress Notes (Signed)
02/21/11  10:31 PT Note: Pt politely declined PT this am secondary to not feeling well.  Will follow-up with pt.  02/21/2011 [name], PT, DPT [phone]

## 2011-02-21 NOTE — Progress Notes (Signed)
UR complete 

## 2011-02-21 NOTE — Progress Notes (Signed)
Patient ID: Craig Garrett, male   DOB: 03/24/1960, 51 y.o.   MRN: [id number]   Subjective: Pt continues to have some crampy pain with eating despite having flatus.  No BM since yesterday AM.     Objective: Vital signs in last 24 hours: Temp:  [98.4 F (36.9 C)-98.6 F (37 C)] 98.6 F (37 C) (02/19 0610) Pulse Rate:  [78-87] 78  (02/19 0610) Resp:  [16-18] 18  (02/19 0610) BP: (109-123)/(72-78) 120/72 mmHg (02/19 0610) SpO2:  [96 %-99 %] 97 % (02/19 0610) Last BM Date: 02/20/11  Intake/Output from previous day: 02/18 0701 - 02/19 0700 In: 360 [P.O.:360] Out: 950 [Urine:950] Intake/Output this shift:    General appearance: alert and cooperative, moderate distress Resp: clear to auscultation bilaterally GI: + BS, sl distended, soft  Lab Results: CBC   Basename 03-15-2011 0635  WBC 7.4  HGB 11.2*  HCT 35.1*  PLT 462*   BMET  Basename 15-Mar-2011 0635  NA 139  K 4.1  CL 101  CO2 28  GLUCOSE 106*  BUN 3*  CREATININE 0.80  CALCIUM 9.1   PT/INR No results found for this basename: LABPROT:2,INR:2 in the last 72 hours ABG No results found for this basename: PHART:2,PCO2:2,PO2:2,HCO3:2 in the last 72 hours  Studies/Results: Dg Abd 2 Views  03/15/11  *RADIOLOGY REPORT*  Clinical Data: No bowel movement for 5 days.  ABDOMEN - 2 VIEW  Comparison: Abdominal radiograph 02/17/2011 and CT abdomen pelvis 02/14/2011  Findings:  Plate-like atelectasis at the lung bases bilaterally.  No evidence of free intraperitoneal air.  Some of the oral contrast has progressed into the distal colon since the radiograph of 02/17/2011.  Some contrast remains in the proximal colon.  Small bowel dilatation in the left abdomen has decreased but not completely resolved compared to the recent study of 02/17/2011. Small bowel loops measure up to approximately 3.7 cm.  Borderline prominent small bowel loop in the right lower quadrant.  There are scattered air-fluid levels throughout the small bowel loops.   IMPRESSION: 1. Persistent partial small bowel obstruction pattern.  Small bowel dilatation appears slightly decreased compared to 02/17/2011. 2.  Bibasilar atelectasis.  Original Report Authenticated By: Britta Mccreedy, M.D.    Anti-infectives: Anti-infectives     Start     Dose/Rate Route Frequency Ordered Stop   02/15/11 1600   ertapenem (INVANZ) 1 g in sodium chloride 0.9 % 50 mL IVPB  Status:  Discontinued        1 g 100 mL/hr over 30 Minutes Intravenous Every 24 hours 02/14/11 1634 02/15/11 0813   02/14/11 1715   ertapenem (INVANZ) 1 g in sodium chloride 0.9 % 50 mL IVPB        1 g 100 mL/hr over 30 Minutes Intra[facility]ous [facility] 02/14/11 1709 02/14/11 1753   02/14/11 1615   Ampicillin-Sulbactam (UNASYN) 3 g in sodium chloride 0.9 % 100 mL IVPB  Status:  Discontinued        3 g 100 mL/hr over 60 Minutes Intravenous Every 6 hours 02/14/11 1603 02/14/11 1634          Assessment/Plan: PSBO --Will leave on full liquids and recheck films.  Right ankle/foot fx -- Dr. Carola Frost ABL anemia  VTE -- Lovenox  Dispo -- Not ready for D/C   LOS: 7 days
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
## 2011-02-22 ENCOUNTER — Encounter (HOSPITAL_COMMUNITY): Admission: EM | Disposition: A | Discharge status: Home Health | Payer: Self-pay | Source: Home / Self Care

## 2011-02-22 ENCOUNTER — Encounter (HOSPITAL_COMMUNITY): Payer: Self-pay | Admitting: Anesthesiology

## 2011-02-22 ENCOUNTER — Other Ambulatory Visit (INDEPENDENT_AMBULATORY_CARE_PROVIDER_SITE_OTHER): Payer: Self-pay | Admitting: General Surgery

## 2011-02-22 ENCOUNTER — Inpatient Hospital Stay (HOSPITAL_COMMUNITY): Payer: Medicaid Other | Admitting: Anesthesiology

## 2011-02-22 HISTORY — PX: LAPAROTOMY: SHX154

## 2011-02-22 LAB — CBC
HCT: 41.3 % (ref 39.0–52.0)
Hemoglobin: 14 g/dL (ref 13.0–17.0)
MCH: 29.1 pg (ref 26.0–34.0)
MCH: 29.7 pg (ref 26.0–34.0)
MCHC: 33 g/dL (ref 30.0–36.0)
MCHC: 33.9 g/dL (ref 30.0–36.0)
MCV: 88.3 fL (ref 78.0–100.0)
Platelets: 512 10*3/uL — ABNORMAL HIGH (ref 150–400)
RDW: 14.4 % (ref 11.5–15.5)

## 2011-02-22 LAB — CHOLESTEROL, TOTAL: Cholesterol: 104 mg/dL (ref 0–200)

## 2011-02-22 LAB — SURGICAL PCR SCREEN
MRSA, PCR: POSITIVE — AB
Staphylococcus aureus: POSITIVE — AB

## 2011-02-22 LAB — COMPREHENSIVE METABOLIC PANEL
AST: 59 U/L — ABNORMAL HIGH (ref 0–37)
Albumin: 3 g/dL — ABNORMAL LOW (ref 3.5–5.2)
Calcium: 9.3 mg/dL (ref 8.4–10.5)
Creatinine, Ser: 0.85 mg/dL (ref 0.50–1.35)

## 2011-02-22 LAB — DIFFERENTIAL
Basophils Absolute: 0 10*3/uL (ref 0.0–0.1)
Basophils Relative: 0 % (ref 0–1)
Eosinophils Absolute: 0.2 10*3/uL (ref 0.0–0.7)
Eosinophils Relative: 2 % (ref 0–5)
Lymphs Abs: 2.2 10*3/uL (ref 0.7–4.0)
Neutrophils Relative %: 64 % (ref 43–77)

## 2011-02-22 LAB — MAGNESIUM: Magnesium: 1.8 mg/dL (ref 1.5–2.5)

## 2011-02-22 LAB — PHOSPHORUS: Phosphorus: 3.6 mg/dL (ref 2.3–4.6)

## 2011-02-22 SURGERY — LAPAROTOMY, EXPLORATORY
Anesthesia: General | Site: Abdomen | Wound class: Clean

## 2011-02-22 MED ORDER — 0.9 % SODIUM CHLORIDE (POUR BTL) OPTIME
TOPICAL | Status: DC | PRN
  Administered 2011-02-22: 1000 mL
  Administered 2011-02-22: 2000 mL
  Administered 2011-02-22: 1000 mL

## 2011-02-22 MED ORDER — TRACE MINERALS CR-CU-MN-SE-ZN 10-1000-500-60 MCG/ML IV SOLN
INTRAVENOUS | Status: AC
  Administered 2011-02-22: 18:00:00 via INTRAVENOUS
  Filled 2011-02-22: qty 2000

## 2011-02-22 MED ORDER — PROPRANOLOL HCL 1 MG/ML IV SOLN
INTRAVENOUS | Status: DC | PRN
  Administered 2011-02-22 (×2): .5 mg via INTRAVENOUS

## 2011-02-22 MED ORDER — SUFENTANIL CITRATE 50 MCG/ML IV SOLN
INTRAVENOUS | Status: DC | PRN
  Administered 2011-02-22: 10 ug via INTRAVENOUS
  Administered 2011-02-22: 30 ug via INTRAVENOUS
  Administered 2011-02-22 (×3): 10 ug via INTRAVENOUS
  Administered 2011-02-22: 30 ug via INTRAVENOUS
  Administered 2011-02-22: 10 ug via INTRAVENOUS
  Administered 2011-02-22: 20 ug via INTRAVENOUS

## 2011-02-22 MED ORDER — SODIUM CHLORIDE 0.9 % IJ SOLN: 9.0000 mL | INTRAMUSCULAR | Status: DC | PRN

## 2011-02-22 MED ORDER — DEXTROSE 5 % IV SOLN
INTRAVENOUS | Status: AC
  Filled 2011-02-22: qty 1

## 2011-02-22 MED ORDER — PROPOFOL 10 MG/ML IV EMUL
INTRAVENOUS | Status: DC | PRN
  Administered 2011-02-22: 160 mg via INTRAVENOUS

## 2011-02-22 MED ORDER — MUPIROCIN 2 % EX OINT
TOPICAL_OINTMENT | Freq: Two times a day (BID) | CUTANEOUS | Status: DC
  Administered 2011-02-22: 22:00:00 via NASAL
  Administered 2011-02-23: 1 via NASAL
  Administered 2011-02-23 – 2011-02-24 (×2): via NASAL
  Administered 2011-02-24: 1 via NASAL
  Administered 2011-02-25 – 2011-02-28 (×7): via NASAL
  Administered 2011-02-28 – 2011-03-01 (×2): 1 via NASAL
  Administered 2011-03-01 – 2011-03-06 (×10): via NASAL
  Filled 2011-02-22: qty 22

## 2011-02-22 MED ORDER — MORPHINE SULFATE (PF) 1 MG/ML IV SOLN
INTRAVENOUS | Status: AC
  Administered 2011-02-22: 20:00:00
  Filled 2011-02-22: qty 25

## 2011-02-22 MED ORDER — SODIUM CHLORIDE 0.9 % IV SOLN
INTRAVENOUS | Status: DC | PRN
  Administered 2011-02-22: 14:00:00 via INTRAVENOUS

## 2011-02-22 MED ORDER — ONDANSETRON HCL 4 MG/2ML IJ SOLN
INTRAMUSCULAR | Status: DC | PRN
  Administered 2011-02-22: 4 mg via INTRAVENOUS

## 2011-02-22 MED ORDER — NALOXONE HCL 0.4 MG/ML IJ SOLN: 0.4000 mg | INTRAMUSCULAR | Status: DC | PRN

## 2011-02-22 MED ORDER — LACTATED RINGERS IV SOLN
INTRAVENOUS | Status: DC | PRN
  Administered 2011-02-22: 11:00:00 via INTRAVENOUS

## 2011-02-22 MED ORDER — LIDOCAINE HCL (CARDIAC) 20 MG/ML IV SOLN
INTRAVENOUS | Status: DC | PRN
  Administered 2011-02-22: 40 mg via INTRAVENOUS

## 2011-02-22 MED ORDER — INSULIN ASPART 100 UNIT/ML ~~LOC~~ SOLN
0.0000 [IU] | Freq: Four times a day (QID) | SUBCUTANEOUS | Status: DC
  Administered 2011-02-23 (×2): 2 [IU] via SUBCUTANEOUS
  Filled 2011-02-22 (×2): qty 3

## 2011-02-22 MED ORDER — ONDANSETRON HCL 4 MG/2ML IJ SOLN
4.0000 mg | Freq: Once | INTRAMUSCULAR | Status: AC | PRN
  Administered 2011-02-22: 4 mg via INTRAVENOUS

## 2011-02-22 MED ORDER — ROCURONIUM BROMIDE 100 MG/10ML IV SOLN
INTRAVENOUS | Status: DC | PRN
  Administered 2011-02-22: 50 mg via INTRAVENOUS

## 2011-02-22 MED ORDER — LIDOCAINE HCL 4 % MT SOLN
OROMUCOSAL | Status: DC | PRN
  Administered 2011-02-22: 4 mL via TOPICAL

## 2011-02-22 MED ORDER — SODIUM CHLORIDE 0.9 % IV BOLUS (SEPSIS)
500.0000 mL | Freq: Once | INTRAVENOUS | Status: AC
  Administered 2011-02-22: 500 mL via INTRAVENOUS

## 2011-02-22 MED ORDER — NEOSTIGMINE METHYLSULFATE 1 MG/ML IJ SOLN
INTRAMUSCULAR | Status: DC | PRN
  Administered 2011-02-22: 5 mg via INTRAVENOUS

## 2011-02-22 MED ORDER — MORPHINE SULFATE (PF) 1 MG/ML IV SOLN
INTRAVENOUS | Status: DC
  Administered 2011-02-22: 9 mg via INTRAVENOUS
  Administered 2011-02-22: 15 mg via INTRAVENOUS
  Administered 2011-02-22: 16:00:00 via INTRAVENOUS
  Administered 2011-02-23: 14.55 mg via INTRAVENOUS
  Administered 2011-02-23: 37.11 mg via INTRAVENOUS

## 2011-02-22 MED ORDER — ALBUMIN HUMAN 5 % IV SOLN
12.5000 g | Freq: Once | INTRAVENOUS | Status: AC
  Administered 2011-02-22: 12.5 g via INTRAVENOUS
  Filled 2011-02-22: qty 250

## 2011-02-22 MED ORDER — GLYCOPYRROLATE 0.2 MG/ML IJ SOLN
INTRAMUSCULAR | Status: DC | PRN
  Administered 2011-02-22: .5 mg via INTRAVENOUS

## 2011-02-22 MED ORDER — HYDROMORPHONE HCL PF 1 MG/ML IJ SOLN
0.2500 mg | INTRAMUSCULAR | Status: DC | PRN
  Administered 2011-02-22 (×4): 0.5 mg via INTRAVENOUS

## 2011-02-22 MED ORDER — CHLORHEXIDINE GLUCONATE CLOTH 2 % EX PADS
6.0000 | MEDICATED_PAD | Freq: Every day | CUTANEOUS | Status: DC
  Administered 2011-02-23 – 2011-03-03 (×9): 6 via TOPICAL

## 2011-02-22 MED ORDER — FAT EMULSION 20 % IV EMUL
250.0000 mL | INTRAVENOUS | Status: AC
  Administered 2011-02-22: 250 mL via INTRAVENOUS
  Filled 2011-02-22: qty 250

## 2011-02-22 MED ORDER — DEXTROSE 5 % IV SOLN
1.0000 g | INTRAVENOUS | Status: DC | PRN
  Administered 2011-02-22: 1 g via INTRAVENOUS

## 2011-02-22 MED ORDER — HETASTARCH-ELECTROLYTES 6 % IV SOLN
INTRAVENOUS | Status: DC | PRN
  Administered 2011-02-22: 13:00:00 via INTRAVENOUS

## 2011-02-22 MED ORDER — VECURONIUM BROMIDE 10 MG IV SOLR
INTRAVENOUS | Status: DC | PRN
  Administered 2011-02-22 (×2): 2 mg via INTRAVENOUS
  Administered 2011-02-22: 3 mg via INTRAVENOUS
  Administered 2011-02-22: 4 mg via INTRAVENOUS

## 2011-02-22 MED ORDER — MORPHINE SULFATE (PF) 1 MG/ML IV SOLN
INTRAVENOUS | Status: AC
  Administered 2011-02-22: 23:00:00
  Filled 2011-02-22: qty 25

## 2011-02-22 MED ORDER — SODIUM CHLORIDE 0.9 % IJ SOLN
10.0000 mL | INTRAMUSCULAR | Status: DC | PRN
  Administered 2011-02-26 – 2011-03-18 (×10): 10 mL
  Administered 2011-03-20: 40 mL
  Administered 2011-03-23 – 2011-03-27 (×6): 10 mL
  Filled 2011-02-22: qty 10
  Filled 2011-02-22 (×2): qty 20
  Filled 2011-02-22: qty 10
  Filled 2011-02-22 (×4): qty 20
  Filled 2011-02-22: qty 30
  Filled 2011-02-22 (×2): qty 20
  Filled 2011-02-22 (×2): qty 30
  Filled 2011-02-22: qty 10

## 2011-02-22 SURGICAL SUPPLY — 44 items
BANDAGE GAUZE ELAST BULKY 4 IN (GAUZE/BANDAGES/DRESSINGS) ×2 IMPLANT
BLADE SURG ROTATE 9660 (MISCELLANEOUS) IMPLANT
CANISTER SUCTION 2500CC (MISCELLANEOUS) IMPLANT
CHLORAPREP W/TINT 26ML (MISCELLANEOUS) ×2 IMPLANT
CLOTH BEACON ORANGE TIMEOUT ST (SAFETY) ×2 IMPLANT
COVER SURGICAL LIGHT HANDLE (MISCELLANEOUS) ×4 IMPLANT
DRAPE LAPAROSCOPIC ABDOMINAL (DRAPES) ×2 IMPLANT
DRAPE UTILITY 15X26 W/TAPE STR (DRAPE) ×4 IMPLANT
DRAPE WARM FLUID 44X44 (DRAPE) ×2 IMPLANT
DRSG PAD ABDOMINAL 8X10 ST (GAUZE/BANDAGES/DRESSINGS) ×2 IMPLANT
ELECT BLADE 6.5 EXT (BLADE) ×2 IMPLANT
ELECT REM PT RETURN 9FT ADLT (ELECTROSURGICAL) ×2
ELECTRODE REM PT RTRN 9FT ADLT (ELECTROSURGICAL) ×1 IMPLANT
GLOVE BIO SURGEON STRL SZ8 (GLOVE) ×2 IMPLANT
GLOVE BIOGEL PI IND STRL 8 (GLOVE) ×2 IMPLANT
GLOVE BIOGEL PI INDICATOR 8 (GLOVE) ×2
GLOVE ECLIPSE 7.5 STRL STRAW (GLOVE) ×4 IMPLANT
GOWN STRL NON-REIN LRG LVL3 (GOWN DISPOSABLE) ×6 IMPLANT
KIT BASIN OR (CUSTOM PROCEDURE TRAY) ×2 IMPLANT
KIT ROOM TURNOVER OR (KITS) ×2 IMPLANT
LIGASURE IMPACT 36 18CM CVD LR (INSTRUMENTS) IMPLANT
NS IRRIG 1000ML POUR BTL (IV SOLUTION) ×4 IMPLANT
PACK GENERAL/GYN (CUSTOM PROCEDURE TRAY) ×2 IMPLANT
PAD ARMBOARD 7.5X6 YLW CONV (MISCELLANEOUS) ×4 IMPLANT
RELOAD PROXIMATE 75MM BLUE (ENDOMECHANICALS) ×4 IMPLANT
SPECIMEN JAR X LARGE (MISCELLANEOUS) IMPLANT
SPONGE GAUZE 4X4 12PLY (GAUZE/BANDAGES/DRESSINGS) IMPLANT
SPONGE LAP 18X18 X RAY DECT (DISPOSABLE) ×12 IMPLANT
STAPLER GUN LINEAR PROX 60 (STAPLE) ×2 IMPLANT
STAPLER PROXIMATE 75MM BLUE (STAPLE) ×2 IMPLANT
STAPLER VISISTAT 35W (STAPLE) ×2 IMPLANT
SUCTION POOLE TIP (SUCTIONS) ×2 IMPLANT
SUT NOVA 1 T20/GS 25DT (SUTURE) ×2 IMPLANT
SUT PDS AB 1 TP1 96 (SUTURE) ×6 IMPLANT
SUT SILK 2 0 SH CR/8 (SUTURE) ×2 IMPLANT
SUT SILK 2 0 TIES 10X30 (SUTURE) ×4 IMPLANT
SUT SILK 3 0 SH CR/8 (SUTURE) ×6 IMPLANT
SUT SILK 3 0 TIES 10X30 (SUTURE) ×2 IMPLANT
TAPE CLOTH SURG 6X10 WHT LF (GAUZE/BANDAGES/DRESSINGS) ×2 IMPLANT
TOWEL OR 17X24 6PK STRL BLUE (TOWEL DISPOSABLE) ×2 IMPLANT
TOWEL OR 17X26 10 PK STRL BLUE (TOWEL DISPOSABLE) ×2 IMPLANT
TRAY FOLEY CATH 14FRSI W/METER (CATHETERS) ×2 IMPLANT
WATER STERILE IRR 1000ML POUR (IV SOLUTION) IMPLANT
YANKAUER SUCT BULB TIP NO VENT (SUCTIONS) ×2 IMPLANT
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
## 2011-02-22 NOTE — Progress Notes (Signed)
Trauma Service Note  Subjective: The patient just had a PICC line placed.  X-rays show no resolution of SBO.  High-grade partial obstruction.    Objective: Vital signs in last 24 hours: Temp:  [98 F (36.7 C)-98.7 F (37.1 C)] 98 F (36.7 C) (02/20 0618) Pulse Rate:  [82-98] 85  (02/20 0618) Resp:  [16-18] 18  (02/20 0618) BP: (111-119)/(72-77) 112/72 mmHg (02/20 0618) SpO2:  [95 %-97 %] 95 % (02/20 0618) Last BM Date: 03/04/11  Intake/Output from previous day: 03-03-2022 0701 - 02/20 0700 In: 1290 [P.O.:240; I.V.:1050] Out: 900 [Urine:900] Intake/Output this shift:    General: No acute distress  Lungs: clear.  Oxygen saturation good.  Abd: Firm, especially just to the right of the lower midline incision.,  Not very distended  Extremities: No DVT signs or symptoms  Neuro: Completely intact.  Lab Results: CBC   Basename 02/22/11 0530  WBC 10.2  HGB 12.9*  HCT 39.1  PLT 512*   BMET  Basename 02/22/11 0530  NA 136  K 4.0  CL 104  CO2 24  GLUCOSE 100*  BUN 5*  CREATININE 0.85  CALCIUM 9.3   PT/INR No results found for this basename: LABPROT:2,INR:2 in the last 72 hours ABG No results found for this basename: PHART:2,PCO2:2,PO2:2,HCO3:2 in the last 72 hours  Studies/Results: Dg Abd 2 Views  2011/03/04  *RADIOLOGY REPORT*  Clinical Data: Abdominal pain, constipation, recent small bowel resection  ABDOMEN - 2 VIEW  Comparison: 02/19/2011  Findings: Dilated small bowel loops in the left abdomen measure 4.3 cm in diameter on the supine view.  Upright view demonstrates scattered associated small bowel air fluid levels.  Contrast in the colon has been evacuated since 02/19/2011.  No free air evident. Findings compatible with residual partial small bowel obstruction. Scattered lower lobe atelectasis noted.  Normal heart size.  Postop changes in the right lower quadrant.  No abnormal or acute osseous finding.  IMPRESSION: Persistent partial small bowel obstruction pattern.   Minimal increase in small bowel dilatation compared to 02/19/2011.  Bibasilar atelectasis.  No free air.  Original Report Authenticated By: Judie Petit. Ruel Favors, M.D.    Anti-infectives: None currently.  Will give Cefoxitin preoperatively.  Assessment/Plan: s/p Procedure(s): EXPLORATORY LAPAROTOMY Take to the OR for SBO/exploratory laparotomy, possible bowel resection.  LOS: 8 days   Marta Lamas. Gae Bon, MD, FACS [phone] Trauma Surgeon 02/22/2011
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
## 2011-02-22 NOTE — Anesthesia Preprocedure Evaluation (Addendum)
Anesthesia Evaluation  Patient identified by MRN, date of birth, ID band Patient awake    Reviewed: Allergy & Precautions, H&P , NPO status , Patient's Chart, lab work & pertinent test results  Airway Mallampati: I TM Distance: >3 FB Neck ROM: Full    Dental  (+) Poor Dentition, Loose and Dental Advisory Given   Pulmonary Current Smoker,          Cardiovascular neg cardio ROS regular Normal    Neuro/Psych Negative Neurological ROS     GI/Hepatic negative GI ROS, (+) Hepatitis -, C  Endo/Other    Renal/GU   Genitourinary negative   Musculoskeletal   Abdominal   Peds negative pediatric ROS (+)  Hematology   Anesthesia Other Findings   Reproductive/Obstetrics                         Anesthesia Physical Anesthesia Plan  ASA: III  Anesthesia Plan: General   Post-op Pain Management:    Induction: Intravenous  Airway Management Planned: Oral ETT  Additional Equipment:   Intra-op Plan:   Post-operative Plan: Extubation in OR  Informed Consent: I have reviewed the patients History and Physical, chart, labs and discussed the procedure including the risks, benefits and alternatives for the proposed anesthesia with the patient or authorized representative who has indicated his/her understanding and acceptance.     Plan Discussed with: CRNA, Anesthesiologist and Surgeon  Anesthesia Plan Comments:         Anesthesia Quick Evaluation

## 2011-02-22 NOTE — Anesthesia Postprocedure Evaluation (Signed)
  Anesthesia Post-op Note  Patient: Craig Garrett  Procedure(s) Performed: Procedure(s) (LRB): EXPLORATORY LAPAROTOMY (N/A)  Patient Location: PACU  Anesthesia Type: General  Level of Consciousness: awake  Airway and Oxygen Therapy: Patient Spontanous Breathing  Post-op Pain: mild  Post-op Assessment: Post-op Vital signs reviewed, Patient's Cardiovascular Status Stable, Respiratory Function Stable, Patent Airway, No signs of Nausea or vomiting and Pain level controlled  Post-op Vital Signs: stable  Complications: No apparent anesthesia complications

## 2011-02-22 NOTE — Transfer of Care (Signed)
Immediate Anesthesia Transfer of Care Note  Patient: Craig Garrett  Procedure(s) Performed: Procedure(s) (LRB): EXPLORATORY LAPAROTOMY (N/A)  Patient Location: PACU  Anesthesia Type: General  Level of Consciousness: awake, alert , oriented and patient cooperative  Airway & Oxygen Therapy: Patient Spontanous Breathing and Patient connected to face mask oxygen  Post-op Assessment: Report given to PACU RN, Post -op Vital signs reviewed and stable and Patient moving all extremities X 4  Post vital signs: Reviewed and stable  Complications: No apparent anesthesia complications

## 2011-02-22 NOTE — Progress Notes (Signed)
Orthopedic Tech Progress Note Patient Details:  Craig Garrett 1960-03-13 [id number]  Other Ortho Devices Type of Ortho Device: Other (comment) (abdominal binder) Ortho Device Location: abdomen Ortho Device Interventions: Ordered Viewed order from rn order list  Nikki Dom 02/22/2011, 4:16 PM

## 2011-02-22 NOTE — Progress Notes (Signed)
INITIAL ADULT NUTRITION ASSESSMENT Date: 02/22/2011   Time: 2:10 PM  Reason for Assessment: TPN  ASSESSMENT: Male 51 y.o.  Dx: high-grade partial obstruction  Hx:  Past Medical History  Diagnosis Date  . Arthritis   . Jock itch     Related Meds:     . enoxaparin (LOVENOX) injection  40 mg Subcutaneous Daily  . insulin aspart  0-9 Units Subcutaneous Q6H  . polyvinyl alcohol  1 drop Right Eye QID  . DISCONTD: bacitracin   Topical BID    Ht: 5\' 11"  (180.3 cm)  Wt: 189 lb (85.73 kg)  Ideal Wt: 78 kg % Ideal Wt: 110%  Usual Wt: unable to obtain % Usual Wt: ---  Body mass index is 26.36 kg/(m^2).  Food/Nutrition Related Hx: no nutrition triggers per admission nutrition screen  Labs:  CMP     Component Value Date/Time   NA 136 02/22/2011 0530   K 4.0 02/22/2011 0530   CL 104 02/22/2011 0530   CO2 24 02/22/2011 0530   GLUCOSE 100* 02/22/2011 0530   BUN 5* 02/22/2011 0530   CREATININE 0.85 02/22/2011 0530   CALCIUM 9.3 02/22/2011 0530   PROT 6.5 02/22/2011 0530   ALBUMIN 3.0* 02/22/2011 0530   AST 59* 02/22/2011 0530   ALT 58* 02/22/2011 0530   ALKPHOS 101 02/22/2011 0530   BILITOT 0.4 02/22/2011 0530   GFRNONAA >90 02/22/2011 0530   GFRAA >90 02/22/2011 0530    I/O last 3 completed shifts: In: 1770 [P.O.:720; I.V.:1050] Out: 1650 [Urine:1650] Total I/O In: 4700 [I.V.:3500; Blood:700; IV Piggyback:500] Out: 1610 [Urine:610; Blood:1000]  Diet Order: NPO  Supplements/Tube Feeding: N/A  IVF:    0.45 % NaCl with KCl 20 mEq / L Last Rate: 75 mL/hr at 02/21/11 1713  TPN (CLINIMIX) +/- additives   And   fat emulsion     Estimated Nutritional Needs:   Kcal: 2200-2400 Protein: 110-120 gm Fluid: 2.2-2.4 L  RD unable to obtain nutrition hx from pt at this time -- in surgery -- for exploratory laparotomy, possible bowel resection; noted was on clear/full liquids since admission; per H&P pt had poor intake & nausea PTA; PICC line placed -- receiving TPN with Clinimix E  5/15 @ 50 ml/hr.  Lipids (20% IVFE @ 10 ml/hr), multivitamins, and trace elements are provided 3 times weekly (MWF) due to national backorder.  Provides 1332 kcal and 60 grams protein daily (based on weekly average).  Meets 60% minimum estimated kcal and 55% minimum estimated protein needs  NUTRITION DIAGNOSIS: -Inadequate oral intake (NI-2.1).  Status: Ongoing  RELATED TO: altered GI function, partial SBO  AS EVIDENCE BY: NPO status  MONITORING/EVALUATION(Goals): Goal: TPN to meet >90% of estimated protein needs, maximize energy provision as able during national lipid backorder Monitor: TPN prescription, labs, weight, I/O's  EDUCATION NEEDS: -No education needs identified at this time  INTERVENTION:  TPN per pharmacy  RD to follow for nutrition care plan  Dietitian #: [phone]  DOCUMENTATION CODES Per approved criteria  -Not Applicable    Alger Memos 02/22/2011, 2:10 PM

## 2011-02-22 NOTE — Progress Notes (Signed)
02/22/2011 7:09 PM Notified MD of patients heart rate in the 130-140's. Received orders to draw hgb and give 500cc NS bolus. Drew blood and started fluids. Will continue to monitor. Celesta Gentile

## 2011-02-22 NOTE — Progress Notes (Signed)
Orthopedic Tech Progress Note Patient Details:  Craig Garrett 29-Jun-1960 [id number]  Other Ortho Devices Type of Ortho Device: Other (comment) (abdominal binder) Ortho Device Location: abdomen Ortho Device Interventions: Craig Garrett, Craig Garrett 02/22/2011, 4:16 PM

## 2011-02-23 DIAGNOSIS — D649 Anemia, unspecified: Secondary | ICD-10-CM

## 2011-02-23 DIAGNOSIS — Z8619 Personal history of other infectious and parasitic diseases: Secondary | ICD-10-CM | POA: Insufficient documentation

## 2011-02-23 LAB — GLUCOSE, CAPILLARY
Glucose-Capillary: 128 mg/dL — ABNORMAL HIGH (ref 70–99)
Glucose-Capillary: 131 mg/dL — ABNORMAL HIGH (ref 70–99)
Glucose-Capillary: 136 mg/dL — ABNORMAL HIGH (ref 70–99)
Glucose-Capillary: 144 mg/dL — ABNORMAL HIGH (ref 70–99)
Glucose-Capillary: 169 mg/dL — ABNORMAL HIGH (ref 70–99)
Glucose-Capillary: 194 mg/dL — ABNORMAL HIGH (ref 70–99)

## 2011-02-23 LAB — POCT I-STAT 4, (NA,K, GLUC, HGB,HCT)
Glucose, Bld: 144 mg/dL — ABNORMAL HIGH (ref 70–99)
HCT: 36 % — ABNORMAL LOW (ref 39.0–52.0)
Hemoglobin: 12.2 g/dL — ABNORMAL LOW (ref 13.0–17.0)
Potassium: 4.5 mEq/L (ref 3.5–5.1)
Sodium: 137 mEq/L (ref 135–145)
Sodium: 143 mEq/L (ref 135–145)

## 2011-02-23 LAB — COMPREHENSIVE METABOLIC PANEL
AST: 27 U/L (ref 0–37)
Albumin: 2.3 g/dL — ABNORMAL LOW (ref 3.5–5.2)
BUN: 9 mg/dL (ref 6–23)
Calcium: 7.8 mg/dL — ABNORMAL LOW (ref 8.4–10.5)
Chloride: 101 mEq/L (ref 96–112)
Creatinine, Ser: 0.78 mg/dL (ref 0.50–1.35)
Total Protein: 4.4 g/dL — ABNORMAL LOW (ref 6.0–8.3)

## 2011-02-23 LAB — PHOSPHORUS: Phosphorus: 2.8 mg/dL (ref 2.3–4.6)

## 2011-02-23 LAB — TYPE AND SCREEN
ABO/RH(D): O POS
Antibody Screen: NEGATIVE
Unit division: 0

## 2011-02-23 LAB — CBC
HCT: 36 % — ABNORMAL LOW (ref 39.0–52.0)
Hemoglobin: 12.4 g/dL — ABNORMAL LOW (ref 13.0–17.0)
MCHC: 34.4 g/dL (ref 30.0–36.0)
RBC: 4.16 MIL/uL — ABNORMAL LOW (ref 4.22–5.81)
WBC: 20.7 10*3/uL — ABNORMAL HIGH (ref 4.0–10.5)

## 2011-02-23 LAB — MAGNESIUM: Magnesium: 1.4 mg/dL — ABNORMAL LOW (ref 1.5–2.5)

## 2011-02-23 MED ORDER — HYDROMORPHONE 0.3 MG/ML IV SOLN
INTRAVENOUS | Status: AC
  Filled 2011-02-23: qty 25

## 2011-02-23 MED ORDER — DIPHENHYDRAMINE HCL 50 MG/ML IJ SOLN: 12.5000 mg | Freq: Four times a day (QID) | INTRAMUSCULAR | Status: DC | PRN

## 2011-02-23 MED ORDER — INSULIN REGULAR HUMAN 100 UNIT/ML IJ SOLN
INTRAVENOUS | Status: AC
  Administered 2011-02-23: 17:00:00 via INTRAVENOUS
  Filled 2011-02-23: qty 2000

## 2011-02-23 MED ORDER — NALOXONE HCL 0.4 MG/ML IJ SOLN: 0.4000 mg | INTRAMUSCULAR | Status: DC | PRN

## 2011-02-23 MED ORDER — SODIUM CHLORIDE 0.9 % IJ SOLN: 9.0000 mL | INTRAMUSCULAR | Status: DC | PRN

## 2011-02-23 MED ORDER — MORPHINE SULFATE (PF) 1 MG/ML IV SOLN
INTRAVENOUS | Status: AC
  Administered 2011-02-23: 06:00:00
  Filled 2011-02-23: qty 25

## 2011-02-23 MED ORDER — INSULIN ASPART 100 UNIT/ML ~~LOC~~ SOLN
0.0000 [IU] | SUBCUTANEOUS | Status: DC
  Administered 2011-02-23 – 2011-02-25 (×8): 1 [IU] via SUBCUTANEOUS

## 2011-02-23 MED ORDER — SODIUM CHLORIDE 0.9 % IV SOLN: 350.0000 mg | Freq: Once | INTRAVENOUS | Status: DC

## 2011-02-23 MED ORDER — SODIUM CHLORIDE 0.9 % IV SOLN
1.0000 g | Freq: Once | INTRAVENOUS | Status: AC
  Administered 2011-02-23: 1 g via INTRAVENOUS
  Filled 2011-02-23: qty 10

## 2011-02-23 MED ORDER — HYDROMORPHONE 0.3 MG/ML IV SOLN
INTRAVENOUS | Status: DC
  Administered 2011-02-23: 10:00:00 via INTRAVENOUS
  Administered 2011-02-23: 5.05 mg via INTRAVENOUS
  Administered 2011-02-23: 2.27 mg via INTRAVENOUS
  Administered 2011-02-23: 18:00:00 via INTRAVENOUS
  Administered 2011-02-23: 1.2 mg via INTRAVENOUS
  Administered 2011-02-24: 2.7 mg via INTRAVENOUS
  Administered 2011-02-24: 3 mg via INTRAVENOUS
  Administered 2011-02-24: 3.9 mg via INTRAVENOUS
  Administered 2011-02-24: 3.46 mg via INTRAVENOUS
  Administered 2011-02-24: 7.8 mg via INTRAVENOUS
  Administered 2011-02-25: 4.5 mg via INTRAVENOUS
  Administered 2011-02-25: 4.09 mg via INTRAVENOUS
  Administered 2011-02-25: 4.74 mg via INTRAVENOUS

## 2011-02-23 MED ORDER — MORPHINE SULFATE (PF) 1 MG/ML IV SOLN
INTRAVENOUS | Status: AC
  Administered 2011-02-23: 03:00:00
  Filled 2011-02-23: qty 25

## 2011-02-23 MED ORDER — METOPROLOL TARTRATE 1 MG/ML IV SOLN
10.0000 mg | Freq: Four times a day (QID) | INTRAVENOUS | Status: DC
  Administered 2011-02-23 – 2011-02-28 (×21): 10 mg via INTRAVENOUS
  Filled 2011-02-23 (×28): qty 10

## 2011-02-23 MED ORDER — ONDANSETRON HCL 4 MG/2ML IJ SOLN
4.0000 mg | Freq: Four times a day (QID) | INTRAMUSCULAR | Status: DC | PRN
  Administered 2011-02-23 – 2011-02-24 (×2): 4 mg via INTRAVENOUS
  Filled 2011-02-23: qty 2

## 2011-02-23 MED ORDER — DIPHENHYDRAMINE HCL 12.5 MG/5ML PO ELIX
12.5000 mg | ORAL_SOLUTION | Freq: Four times a day (QID) | ORAL | Status: DC | PRN
  Filled 2011-02-23: qty 5

## 2011-02-23 MED FILL — Hydromorphone HCl Inj 1 MG/ML: INTRAMUSCULAR | Qty: 1 | Status: AC
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
## 2011-02-23 NOTE — Progress Notes (Signed)
PT Cancellation Note  Treatment cancelled today due to patient's refusal to participate. Patient reports feeling woozy from medication BP at rest 151/111 and HR 120. Nursing report patient was keen to get up earlier but became orthostatic. Patient known to me from earlier in admission and very motivated.  Rosy Estabrook 02/23/2011, 10:32 AM

## 2011-02-23 NOTE — Progress Notes (Signed)
Inadequate pain control with morphine - will try dilaudid PCA. Continue NGT until bowel function returns. Patient examined, as above Violeta Gelinas, MD, MPH, FACS Pager: [phone]

## 2011-02-23 NOTE — Plan of Care (Signed)
Problem: Diagnosis - Type of Surgery Goal: General Surgical Patient Education (See Patient Education module for education specifics)  Small bowel obstruction

## 2011-02-23 NOTE — Progress Notes (Signed)
Patient ID: Craig Garrett, male   DOB: Feb 03, 1960, 51 y.o.   MRN: [id number]   LOS: 9 days  POD#1  Subjective: Had rough night with pain, nausea but feels better now.  Objective: Vital signs in last 24 hours: Temp:  [97.6 F (36.4 C)-99.3 F (37.4 C)] 99.3 F (37.4 C) (02/21 0322) Pulse Rate:  [88-135] 107  (02/21 0530) Resp:  [10-25] 15  (02/21 0604) BP: (115-155)/(79-108) 137/95 mmHg (02/21 0530) SpO2:  [94 %-100 %] 96 % (02/21 0604) Weight:  [85 kg (187 lb 6.3 oz)] 85 kg (187 lb 6.3 oz) (02/21 0400) Last BM Date: 02/21/11  NGT: 247ml/24h  Lab Results:  CBC  Basename 02/23/11 0325 02/22/11 1900  WBC 20.7* 16.1*  HGB 12.4* 14.0  HCT 36.0* 41.3  PLT 353 345   BMET  Basename 02/23/11 0325 02/22/11 0530  NA 131* 136  K 4.2 4.0  CL 101 104  CO2 23 24  GLUCOSE 169* 100*  BUN 9 5*  CREATININE 0.78 0.85  CALCIUM 7.8* 9.3    General appearance: alert and no distress Resp: clear to auscultation bilaterally Cardio: Mild tachycardia GI: Soft, minimal bowel sounds. Dressing left intact.  Assessment/Plan: PSBO s/p SB/colon resection/LOA  Ileus -- Would favor d/c NGT, defer to MD Right foot/ankle fx ID -- WBC elevated significantly today. ? Reactive as afebrile. Will monitor. Hepatits C+ Tachycardia -- Improved on lopressor FEN -- Replace calcium VTE -- Lovenox Dispo -- Ileus. Will leave in SDU with tachycardia for today.   [name], PA-C Pager: [phone] General Trauma PA Pager: [phone]   02/23/2011

## 2011-02-24 ENCOUNTER — Inpatient Hospital Stay (HOSPITAL_COMMUNITY): Payer: Medicaid Other

## 2011-02-24 ENCOUNTER — Encounter (HOSPITAL_COMMUNITY): Payer: Self-pay | Admitting: General Surgery

## 2011-02-24 DIAGNOSIS — M79609 Pain in unspecified limb: Secondary | ICD-10-CM

## 2011-02-24 DIAGNOSIS — J9819 Other pulmonary collapse: Secondary | ICD-10-CM

## 2011-02-24 LAB — URINALYSIS, ROUTINE W REFLEX MICROSCOPIC
Glucose, UA: NEGATIVE mg/dL
Leukocytes, UA: NEGATIVE
Protein, ur: 30 mg/dL — AB
Specific Gravity, Urine: 1.029 (ref 1.005–1.030)
pH: 7.5 (ref 5.0–8.0)

## 2011-02-24 LAB — URINE MICROSCOPIC-ADD ON

## 2011-02-24 LAB — CBC
HCT: 32 % — ABNORMAL LOW (ref 39.0–52.0)
MCHC: 34.4 g/dL (ref 30.0–36.0)
Platelets: 292 10*3/uL (ref 150–400)
RDW: 14.5 % (ref 11.5–15.5)
WBC: 23.2 10*3/uL — ABNORMAL HIGH (ref 4.0–10.5)

## 2011-02-24 LAB — BASIC METABOLIC PANEL
BUN: 10 mg/dL (ref 6–23)
Chloride: 100 mEq/L (ref 96–112)
GFR calc Af Amer: >90 mL/min (ref >90)
GFR calc non Af Amer: >90 mL/min (ref >90)
Potassium: 4.2 mEq/L (ref 3.5–5.1)

## 2011-02-24 LAB — GLUCOSE, CAPILLARY

## 2011-02-24 MED ORDER — METOPROLOL TARTRATE 1 MG/ML IV SOLN
10.0000 mg | Freq: Once | INTRAVENOUS | Status: AC
  Administered 2011-02-24: 10 mg via INTRAVENOUS

## 2011-02-24 MED ORDER — SODIUM CHLORIDE 0.9 % IV BOLUS (SEPSIS)
1000.0000 mL | Freq: Once | INTRAVENOUS | Status: AC
  Administered 2011-02-24: 1000 mL via INTRAVENOUS

## 2011-02-24 MED ORDER — SODIUM CHLORIDE 0.9 % IV SOLN
12.5000 mg | Freq: Four times a day (QID) | INTRAVENOUS | Status: DC | PRN
  Administered 2011-02-24: 12.5 mg via INTRAVENOUS
  Filled 2011-02-24 (×2): qty 0.5

## 2011-02-24 MED ORDER — TRACE MINERALS CR-CU-MN-SE-ZN 10-1000-500-60 MCG/ML IV SOLN
INTRAVENOUS | Status: AC
  Administered 2011-02-24: 17:00:00 via INTRAVENOUS
  Filled 2011-02-24: qty 2280

## 2011-02-24 MED ORDER — FAT EMULSION 20 % IV EMUL
250.0000 mL | INTRAVENOUS | Status: AC
  Administered 2011-02-24: 250 mL via INTRAVENOUS
  Filled 2011-02-24: qty 250

## 2011-02-24 MED ORDER — HYDROMORPHONE 0.3 MG/ML IV SOLN
INTRAVENOUS | Status: AC
  Administered 2011-02-24: 06:00:00
  Filled 2011-02-24: qty 25

## 2011-02-24 MED ORDER — HYDROMORPHONE 0.3 MG/ML IV SOLN
INTRAVENOUS | Status: AC
  Filled 2011-02-24: qty 25

## 2011-02-24 MED ORDER — HYDROMORPHONE 0.3 MG/ML IV SOLN
INTRAVENOUS | Status: AC
  Administered 2011-02-24: 3 mg via INTRAVENOUS
  Filled 2011-02-24: qty 25

## 2011-02-24 NOTE — Progress Notes (Signed)
2 Days Post-Op  Subjective: C/o hiccupping a lot. Not passing any flatus yet.   Dilaudid helping more with pain, but remains somewhat tachycardic. No significant fever, but increased leukocytosis.   Objective: Vital signs in last 24 hours: Temp:  [98.1 F (36.7 C)-99.6 F (37.6 C)] 99.6 F (37.6 C) (02/22 0300) Pulse Rate:  [101-131] 130  (02/22 0300) Resp:  [11-24] 19  (02/22 0313) BP: (115-148)/(75-96) 116/82 mmHg (02/22 0300) SpO2:  [92 %-96 %] 94 % (02/22 0313) Weight:  [185 lb 13.6 oz (84.3 kg)-187 lb 6.3 oz (85 kg)] 185 lb 13.6 oz (84.3 kg) (02/22 0310) Last BM Date: 02/21/11  Intake/Output from previous day: 02/21 0701 - 02/22 0700 In: 2080 [I.V.:500; TPN:1580] Out: 2625 [Urine:1800; Emesis/NG output:825] Intake/Output this shift:    General appearance: alert, cooperative and mild distress Resp: clear to auscultation bilaterally Cardio: regular rate and rhythm and tachycardic to 120's GI: open midline abdominal incision is clean, dark pink, minimal serous drainage in wound, no odor  Lab Results:   Basename 02/24/11 0315 02/23/11 0325  WBC 23.2* 20.7*  HGB 11.0* 12.4*  HCT 32.0* 36.0*  PLT 292 353   BMET  Basename 02/24/11 0315 02/23/11 0325  NA 133* 131*  K 4.2 4.2  CL 100 101  CO2 27 23  GLUCOSE 126* 169*  BUN 10 9  CREATININE 0.74 0.78  CALCIUM 8.5 7.8*   PT/INR No results found for this basename: LABPROT:2,INR:2 in the last 72 hours ABG No results found for this basename: PHART:2,PCO2:2,PO2:2,HCO3:2 in the last 72 hours  Studies/Results: No results found.  Anti-infectives: Anti-infectives     Start     Dose/Rate Route Frequency Ordered Stop   02/15/11 1600   ertapenem (INVANZ) 1 g in sodium chloride 0.9 % 50 mL IVPB  Status:  Discontinued        1 g 100 mL/hr over 30 Minutes Intravenous Every 24 hours 02/14/11 1634 02/15/11 0813   02/14/11 1715   ertapenem (INVANZ) 1 g in sodium chloride 0.9 % 50 mL IVPB        1 g 100 mL/hr over 30  Minutes Intravenous To [facility] 02/14/11 1709 02/14/11 1753   02/14/11 1615   Ampicillin-Sulbactam (UNASYN) 3 g in sodium chloride 0.9 % 100 mL IVPB  Status:  Discontinued        3 g 100 mL/hr over 60 Minutes Intravenous Every 6 hours 02/14/11 1603 02/14/11 1634          Assessment/Plan: s/p Procedure(s) (LRB): EXPLORATORY LAPAROTOMY (N/A) SBO PSBO s/p SB/colon resection/LOA  Ileus -- ileus continues, will continue NGT, NGT OP 825 ml/24 hrs Open abd skin wound- Will start VAC dressing if MD agrees. Right foot/ankle fx- Dr. Carola Frost ID -- WBC elevated significantly today, will culture urine, blood, check CXR and check Doppler's, may need empiric antibiotic coverage.  Hepatits C+  Tachycardia --will check EKG, UOP good, but will give a little volume again this am, continue Lopressor Hiccups- Low dose Thorazine FEN -- Continues TNA  VTE -- Lovenox  Dispo -- Continue care in SDU with continued tachycardia, leukocytosis, etc.   LOS: 10 days    Lama Narayanan,PA-C Pager [phone] General Trauma Pager [phone]

## 2011-02-24 NOTE — Consult Note (Signed)
WOC consult Note Reason for Consult: req. For placement of VAC dressing initial placement. Pt has open midline abdominal incision.   Wound type:surgical-  abdomen Measurement:16cm x 2.5cm x 1.0cm Wound bed: pink, fatty tissue, moist Drainage (amount, consistency, odor) moderate bloody drainage Periwound:intact without problems Dressing procedure/placement/frequency: 1 pc black granufoam placed in wound bed, suction and seal obtained at continuous, pt utilized PCA for pain control during dressing change. Tolerated well. Ok for bedside nurse to change M/W/F  WOC will follow for assistance with dressing as needed.  Thanks Makaylen Thieme [employer], [place] [phone]

## 2011-02-24 NOTE — Progress Notes (Signed)
WBC still up early post-op Abdominal exam just expected soreness Wound clean - place [facility] Patient examined and I agree with the assessment and plan  Violeta Gelinas, MD, MPH, FACS Pager: [phone]  02/24/2011 10:02 AM
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
## 2011-02-24 NOTE — Progress Notes (Signed)
UR of chart complete.  

## 2011-02-24 NOTE — Progress Notes (Signed)
*  PRELIMINARY RESULTS* Vascular Ultrasound Bilateral lower extremity venous duplex has been completed.  Preliminary findings: No evidence of DVT, SVT or Baker's cyst noted. Right lower extremity: unable to image beyond the lower calf area due to brace and bandage.   Craig Garrett, Real Cons 02/24/2011, 3:33 PM

## 2011-02-25 ENCOUNTER — Encounter (HOSPITAL_COMMUNITY): Payer: Self-pay | Admitting: Cardiology

## 2011-02-25 DIAGNOSIS — I471 Supraventricular tachycardia, unspecified: Secondary | ICD-10-CM

## 2011-02-25 DIAGNOSIS — R0789 Other chest pain: Secondary | ICD-10-CM | POA: Insufficient documentation

## 2011-02-25 DIAGNOSIS — I498 Other specified cardiac arrhythmias: Secondary | ICD-10-CM

## 2011-02-25 LAB — BASIC METABOLIC PANEL
Chloride: 98 meq/L (ref 96–112)
Creatinine, Ser: 0.72 mg/dL (ref 0.50–1.35)
GFR calc Af Amer: >90 mL/min (ref >90)

## 2011-02-25 LAB — URINE CULTURE
Colony Count: NO GROWTH
Culture  Setup Time: 201302220952

## 2011-02-25 LAB — CBC
HCT: 27.6 % — ABNORMAL LOW (ref 39.0–52.0)
Platelets: 267 10*3/uL (ref 150–400)
RDW: 14.5 % (ref 11.5–15.5)
WBC: 22.3 10*3/uL — ABNORMAL HIGH (ref 4.0–10.5)

## 2011-02-25 LAB — GLUCOSE, CAPILLARY
Glucose-Capillary: 128 mg/dL — ABNORMAL HIGH (ref 70–99)
Glucose-Capillary: 130 mg/dL — ABNORMAL HIGH (ref 70–99)
Glucose-Capillary: 134 mg/dL — ABNORMAL HIGH (ref 70–99)
Glucose-Capillary: 148 mg/dL — ABNORMAL HIGH (ref 70–99)

## 2011-02-25 MED ORDER — INSULIN ASPART 100 UNIT/ML ~~LOC~~ SOLN
0.0000 [IU] | Freq: Three times a day (TID) | SUBCUTANEOUS | Status: DC
  Administered 2011-02-25 – 2011-02-27 (×8): 1 [IU] via SUBCUTANEOUS
  Administered 2011-02-28: 2 [IU] via SUBCUTANEOUS
  Administered 2011-02-28 – 2011-03-01 (×2): 1 [IU] via SUBCUTANEOUS

## 2011-02-25 MED ORDER — DIPHENHYDRAMINE HCL 50 MG/ML IJ SOLN
12.5000 mg | Freq: Four times a day (QID) | INTRAMUSCULAR | Status: DC | PRN
  Administered 2011-03-04: 12.5 mg via INTRAVENOUS
  Filled 2011-02-25: qty 1

## 2011-02-25 MED ORDER — SODIUM CHLORIDE 0.9 % IV SOLN
12.5000 mg | Freq: Four times a day (QID) | INTRAVENOUS | Status: DC
  Administered 2011-02-25 – 2011-02-26 (×4): 12.5 mg via INTRAVENOUS
  Filled 2011-02-25 (×8): qty 0.5

## 2011-02-25 MED ORDER — INSULIN REGULAR HUMAN 100 UNIT/ML IJ SOLN
INTRAMUSCULAR | Status: AC
  Administered 2011-02-25: 18:00:00 via INTRAVENOUS
  Filled 2011-02-25: qty 2280

## 2011-02-25 MED ORDER — HYDROMORPHONE 0.3 MG/ML IV SOLN
INTRAVENOUS | Status: AC
  Administered 2011-02-25: 04:00:00
  Filled 2011-02-25: qty 25

## 2011-02-25 MED ORDER — ALBUMIN HUMAN 5 % IV SOLN
INTRAVENOUS | Status: AC
  Administered 2011-02-25: 12.5 g via INTRAVENOUS
  Filled 2011-02-25: qty 250

## 2011-02-25 MED ORDER — SODIUM CHLORIDE 0.9 % IJ SOLN
9.0000 mL | INTRAMUSCULAR | Status: DC | PRN
  Administered 2011-02-27 – 2011-03-05 (×2): 9 mL via INTRAVENOUS

## 2011-02-25 MED ORDER — METOPROLOL TARTRATE 1 MG/ML IV SOLN
10.0000 mg | Freq: Once | INTRAVENOUS | Status: AC
  Administered 2011-02-25: 10 mg via INTRAVENOUS
  Filled 2011-02-25: qty 10

## 2011-02-25 MED ORDER — HYDROMORPHONE 0.3 MG/ML IV SOLN
INTRAVENOUS | Status: AC
  Filled 2011-02-25: qty 25

## 2011-02-25 MED ORDER — HYDROMORPHONE 0.3 MG/ML IV SOLN
INTRAVENOUS | Status: DC
  Administered 2011-02-25: 14:00:00 via INTRAVENOUS
  Administered 2011-02-25: 2.1 mg via INTRAVENOUS
  Administered 2011-02-25: 3.6 mg via INTRAVENOUS
  Administered 2011-02-25: 2.1 mg via INTRAVENOUS
  Administered 2011-02-25: 2.5 mg via INTRAVENOUS
  Administered 2011-02-26: 1.8 mg via INTRAVENOUS
  Administered 2011-02-26: 2.7 mg via INTRAVENOUS
  Administered 2011-02-26: 4.7 mg via INTRAVENOUS
  Administered 2011-02-26: 4.8 mg via INTRAVENOUS
  Administered 2011-02-26: 14:00:00 via INTRAVENOUS
  Administered 2011-02-26: 4.75 mg via INTRAVENOUS
  Administered 2011-02-27: via INTRAVENOUS
  Administered 2011-02-27: 4.8 mg via INTRAVENOUS
  Administered 2011-02-27: 0.9 mg via INTRAVENOUS
  Administered 2011-02-27: 4.2 mg via INTRAVENOUS
  Administered 2011-02-27: 08:00:00 via INTRAVENOUS
  Administered 2011-02-27: 1.5 mg via INTRAVENOUS
  Administered 2011-02-27: 6.3 mg via INTRAVENOUS
  Administered 2011-02-28: 3.6 mg via INTRAVENOUS
  Administered 2011-02-28: 4.2 mg via INTRAVENOUS
  Administered 2011-02-28: 7.5 mg via INTRAVENOUS
  Administered 2011-02-28: 6 mg via INTRAVENOUS
  Administered 2011-02-28: 4.5 mg via INTRAVENOUS
  Administered 2011-02-28: 2.6 mg via INTRAVENOUS
  Administered 2011-02-28: 2.4 mg via INTRAVENOUS
  Administered 2011-03-01: 1.5 mg via INTRAVENOUS
  Administered 2011-03-01: 19:00:00 via INTRAVENOUS
  Administered 2011-03-01: 5.1 mg via INTRAVENOUS
  Administered 2011-03-01: 3.9 mg via INTRAVENOUS
  Administered 2011-03-01: 01:00:00 via INTRAVENOUS
  Administered 2011-03-01: 3.9 mg via INTRAVENOUS
  Administered 2011-03-01: 2.7 mg via INTRAVENOUS
  Administered 2011-03-01: 5.1 mg via INTRAVENOUS
  Administered 2011-03-02: 3.3 mg via INTRAVENOUS
  Administered 2011-03-02: 4.5 mg via INTRAVENOUS
  Administered 2011-03-02 – 2011-03-03 (×3): via INTRAVENOUS
  Administered 2011-03-03: 4.2 mg via INTRAVENOUS
  Administered 2011-03-03: 3.3 mg via INTRAVENOUS
  Administered 2011-03-03: via INTRAVENOUS
  Administered 2011-03-03: 4.5 mg via INTRAVENOUS
  Administered 2011-03-03: 4.8 mg via INTRAVENOUS
  Administered 2011-03-03: 3 mg via INTRAVENOUS
  Administered 2011-03-04: 3.3 mg via INTRAVENOUS
  Administered 2011-03-04: 2.99 mg via INTRAVENOUS
  Administered 2011-03-04: 2.1 mg via INTRAVENOUS
  Administered 2011-03-05: 0.9 mg via INTRAVENOUS
  Administered 2011-03-05: 1.19 mg via INTRAVENOUS
  Administered 2011-03-05: 0.9 mg via INTRAVENOUS
  Administered 2011-03-05: 1.5 mg via INTRAVENOUS
  Administered 2011-03-05: 1.2 mg via INTRAVENOUS
  Administered 2011-03-05: 0.9 mg via INTRAVENOUS
  Administered 2011-03-06: 1.5 mg via INTRAVENOUS
  Administered 2011-03-06: 1.4 mg via INTRAVENOUS
  Administered 2011-03-06: 3 mg via INTRAVENOUS
  Administered 2011-03-06: 1.2 mg via INTRAVENOUS
  Administered 2011-03-06: 2.4 mg via INTRAVENOUS
  Administered 2011-03-06: 0.6 mg via INTRAVENOUS
  Administered 2011-03-07: 3.9 mg via INTRAVENOUS
  Administered 2011-03-07: 0.6 mg via INTRAVENOUS
  Administered 2011-03-07: 2.4 mg via INTRAVENOUS
  Administered 2011-03-07: 0.3 mg via INTRAVENOUS
  Administered 2011-03-07: 3.3 mg via INTRAVENOUS
  Administered 2011-03-07: 1.5 mg via INTRAVENOUS
  Administered 2011-03-07: 2.1 mg via INTRAVENOUS
  Administered 2011-03-07 – 2011-03-08 (×2): via INTRAVENOUS
  Administered 2011-03-08: 2.76 mg via INTRAVENOUS
  Administered 2011-03-08 (×2): via INTRAVENOUS
  Administered 2011-03-08: 3.3 mg via INTRAVENOUS
  Administered 2011-03-08: 4.5 mg via INTRAVENOUS
  Administered 2011-03-08: 1.97 mg via INTRAVENOUS
  Administered 2011-03-09: 0.9 mg via INTRAVENOUS
  Administered 2011-03-09: 4.2 mg via INTRAVENOUS
  Administered 2011-03-09: 1.2 mg via INTRAVENOUS
  Administered 2011-03-09: 1.8 mg via INTRAVENOUS
  Administered 2011-03-10: 0.3 mg via INTRAVENOUS
  Administered 2011-03-10: 0.9 mg via INTRAVENOUS

## 2011-02-25 MED ORDER — ALBUMIN HUMAN 5 % IV SOLN
12.5000 g | Freq: Once | INTRAVENOUS | Status: AC
  Administered 2011-02-25: 12.5 g via INTRAVENOUS

## 2011-02-25 MED ORDER — DIPHENHYDRAMINE HCL 12.5 MG/5ML PO ELIX
12.5000 mg | ORAL_SOLUTION | Freq: Four times a day (QID) | ORAL | Status: DC | PRN
  Filled 2011-02-25: qty 5

## 2011-02-25 MED ORDER — NALOXONE HCL 0.4 MG/ML IJ SOLN: 0.4000 mg | INTRAMUSCULAR | Status: DC | PRN
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
## 2011-02-25 NOTE — Progress Notes (Signed)
Called and spoke with Craig Garrett in regards to patients heart rate staying in the 140's -150', current BP 120/74, pt asymptamatic at this time. Sanjuana Mae , PA was contacting cardiology. Will continue to monitor. Spoke to him at 1515.
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
## 2011-02-25 NOTE — Progress Notes (Signed)
Patient ID: Craig Garrett, male   DOB: 08-23-1960, 51 y.o.   MRN: [id number]   LOS: 11 days   Subjective: No new c/o. No flatus, N/V. Hiccups improved but not resolved.  Objective: Vital signs in last 24 hours: Temp:  [98.6 F (37 C)-99.9 F (37.7 C)] 99.4 F (37.4 C) (02/23 0345) Pulse Rate:  [112-146] 141  (02/23 0345) Resp:  [17-23] 21  (02/23 0835) BP: (109-135)/(69-82) 123/69 mmHg (02/23 0345) SpO2:  [90 %-98 %] 92 % (02/23 0400) Last BM Date: 02/21/11  NGT: 87ml/24h  Lab Results:  CBC  Basename 02/25/11 0341 02/24/11 0315  WBC 22.3* 23.2*  HGB 9.3* 11.0*  HCT 27.6* 32.0*  PLT 267 292   BMET  Basename 02/25/11 0341 02/24/11 0315  NA 132* 133*  K 3.5 4.2  CL 98 100  CO2 27 27  GLUCOSE 133* 126*  BUN 9 10  CREATININE 0.72 0.74  CALCIUM 8.1* 8.5    General appearance: alert and no distress Resp: clear to auscultation bilaterally Cardio: Tachycardic GI: Soft, appropriately TTP, VAC in place, minimal BS.  Assessment/Plan: PSBO s/p SB/colon resection/LOA  Ileus -- ileus continues, will continue NGT Open abd skin wound- VAC Right foot/ankle fx- Dr. Carola Garrett  ID -- WBC remains elevated in the low 20's, afebrile. Continue to monitor. Hepatits C+  Tachycardia -- Unchanged. Continue lopressor. Hiccups- Schedule thorazine  FEN -- Continues TNA  VTE -- Lovenox  Dispo -- Continue care in SDU with continued tachycardia, leukocytosis   Craig Caldron, PA-C Pager: [phone] General Trauma PA Pager: [phone]   02/25/2011

## 2011-02-26 ENCOUNTER — Encounter (HOSPITAL_COMMUNITY): Payer: Self-pay | Admitting: Radiology

## 2011-02-26 ENCOUNTER — Inpatient Hospital Stay (HOSPITAL_COMMUNITY): Payer: Medicaid Other

## 2011-02-26 DIAGNOSIS — E46 Unspecified protein-calorie malnutrition: Secondary | ICD-10-CM

## 2011-02-26 DIAGNOSIS — R000 Tachycardia, unspecified: Secondary | ICD-10-CM

## 2011-02-26 DIAGNOSIS — R072 Precordial pain: Secondary | ICD-10-CM

## 2011-02-26 DIAGNOSIS — R109 Unspecified abdominal pain: Secondary | ICD-10-CM

## 2011-02-26 LAB — BASIC METABOLIC PANEL
Chloride: 97 meq/L (ref 96–112)
GFR calc Af Amer: >90 mL/min (ref >90)
GFR calc non Af Amer: >90 mL/min (ref >90)
Glucose, Bld: 147 mg/dL — ABNORMAL HIGH (ref 70–99)
Potassium: 3.5 meq/L (ref 3.5–5.1)
Sodium: 132 meq/L — ABNORMAL LOW (ref 135–145)

## 2011-02-26 LAB — CBC
Platelets: 279 10*3/uL (ref 150–400)
RBC: 3.18 MIL/uL — ABNORMAL LOW (ref 4.22–5.81)
WBC: 24.4 10*3/uL — ABNORMAL HIGH (ref 4.0–10.5)

## 2011-02-26 LAB — GLUCOSE, CAPILLARY
Glucose-Capillary: 147 mg/dL — ABNORMAL HIGH (ref 70–99)
Glucose-Capillary: 122 mg/dL — ABNORMAL HIGH (ref 70–99)
Glucose-Capillary: 127 mg/dL — ABNORMAL HIGH (ref 70–99)

## 2011-02-26 MED ORDER — HYDROMORPHONE 0.3 MG/ML IV SOLN
INTRAVENOUS | Status: AC
  Filled 2011-02-26: qty 25

## 2011-02-26 MED ORDER — INSULIN REGULAR HUMAN 100 UNIT/ML IJ SOLN
INTRAVENOUS | Status: AC
  Administered 2011-02-26: 17:00:00 via INTRAVENOUS
  Filled 2011-02-26: qty 2280

## 2011-02-26 MED ORDER — METOPROLOL TARTRATE 1 MG/ML IV SOLN
5.0000 mg | Freq: Four times a day (QID) | INTRAVENOUS | Status: DC | PRN
  Filled 2011-02-26: qty 5

## 2011-02-26 MED ORDER — IOHEXOL 300 MG/ML  SOLN
20.0000 mL | INTRAMUSCULAR | Status: AC
  Administered 2011-02-26 (×2): 20 mL via ORAL

## 2011-02-26 MED ORDER — IOHEXOL 350 MG/ML SOLN
100.0000 mL | Freq: Once | INTRAVENOUS | Status: AC | PRN
  Administered 2011-02-26: 100 mL via INTRAVENOUS

## 2011-02-26 MED ORDER — SODIUM CHLORIDE 0.9 % IV SOLN
25.0000 mg | Freq: Four times a day (QID) | INTRAVENOUS | Status: DC
  Administered 2011-02-26 – 2011-02-28 (×9): 25 mg via INTRAVENOUS
  Filled 2011-02-26 (×17): qty 1

## 2011-02-26 MED ORDER — HYDROMORPHONE 0.3 MG/ML IV SOLN
INTRAVENOUS | Status: AC
  Administered 2011-02-26: 22:00:00
  Filled 2011-02-26: qty 25

## 2011-02-26 MED ORDER — PIPERACILLIN-TAZOBACTAM 3.375 G IVPB
3.3750 g | Freq: Four times a day (QID) | INTRAVENOUS | Status: DC
  Administered 2011-02-26 – 2011-02-27 (×4): 3.375 g via INTRAVENOUS
  Filled 2011-02-26 (×6): qty 50
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
## 2011-02-26 NOTE — Progress Notes (Signed)
Patient ID: Craig Garrett, male   DOB: [birth year], 51 y.o.   MRN: [id number]   LOS: 12 days   Subjective: Up most of the night with abd pain. Has brought up some sputum. Otherwise no change.  Objective: Vital signs in last 24 hours: Temp:  [98.6 F (37 C)-100.5 F (38.1 C)] 98.6 F (37 C) (02/24 0336) Pulse Rate:  [121-155] 121  (02/24 0600) Resp:  [17-25] 24  (02/24 0336) BP: (114-129)/(66-77) 121/68 mmHg (02/24 0336) SpO2:  [91 %-92 %] 91 % (02/24 0336) Last BM Date: 02/21/11  NGT: 935ml/24h  Lab Results:  CBC  Basename 02/26/11 0419 02/25/11 0341  WBC 24.4* 22.3*  HGB 9.3* 9.3*  HCT 27.4* 27.6*  PLT 279 267   BMET  Basename 02/26/11 0419 02/25/11 0341  NA 132* 132*  K 3.5 3.5  CL 97 98  CO2 27 27  GLUCOSE 147* 133*  BUN 10 9  CREATININE 0.67 0.72  CALCIUM 8.2* 8.1*    General appearance: alert and mild distress Resp: rales anterior - left Cardio: Tachycardic GI: Soft, diffuse moderate TTP, diminished BS. VAC in place.  Assessment/Plan: PSBO s/p SB/colon resection/LOA  Ileus -- Making progress with some bowel sounds today. Continue NGT in light of uncertain pathology. Open abd skin wound- VAC  Right foot/ankle fx- Dr. Carola Garrett  ID -- WBC up today to 24. Will get CT chest/abd/pelvis. Hepatits C+  Tachycardia -- Unchanged. Continue lopressor. Appreciate cards consult. Hiccups- Increase thorazine  FEN -- Continues TNA  VTE -- Lovenox  Dispo -- Continue care in SDU with continued tachycardia, leukocytosis    Craig Caldron, PA-C Pager: [phone] General Trauma PA Pager: [phone]   02/26/2011

## 2011-02-26 NOTE — Progress Notes (Signed)
Abdominal pain and still tachy Concern for intra-abdominal process CT chest R/O PE CT AP to evaluate further May need ex lap P above Patient examined and I agree with the assessment and plan  Violeta Gelinas, MD, MPH, FACS Pager: [phone]  02/26/2011 10:38 AM

## 2011-02-26 NOTE — Progress Notes (Signed)
  Echocardiogram 2D Echocardiogram has been performed.  Craig Garrett 02/26/2011, 11:49 AM
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
## 2011-02-26 NOTE — Progress Notes (Signed)
Patient ID: Craig Garrett, male   DOB: 1960/03/23, 51 y.o.   MRN: [id number] CT shows large fluid collection C/W abscess.  No evidence of leak. Contrast passes into colon well.  No PE but L effusion and ? Area of PNA. Will start Zosyn and hold Lovenox for perc drain tomorrow. I D/W Dr. Bonnielee Haff.  Violeta Gelinas, MD, MPH, FACS Pager: [phone]

## 2011-02-27 ENCOUNTER — Inpatient Hospital Stay (HOSPITAL_COMMUNITY): Payer: Medicaid Other

## 2011-02-27 ENCOUNTER — Encounter (HOSPITAL_COMMUNITY): Payer: Self-pay | Admitting: Radiology

## 2011-02-27 LAB — MAGNESIUM: Magnesium: 1.9 mg/dL (ref 1.5–2.5)

## 2011-02-27 LAB — COMPREHENSIVE METABOLIC PANEL
ALT: 65 U/L — ABNORMAL HIGH (ref 0–53)
Albumin: 1.7 g/dL — ABNORMAL LOW (ref 3.5–5.2)
Calcium: 8.2 mg/dL — ABNORMAL LOW (ref 8.4–10.5)
GFR calc Af Amer: >90 mL/min (ref >90)
Glucose, Bld: 135 mg/dL — ABNORMAL HIGH (ref 70–99)
Sodium: 132 mEq/L — ABNORMAL LOW (ref 135–145)
Total Protein: 5.2 g/dL — ABNORMAL LOW (ref 6.0–8.3)

## 2011-02-27 LAB — CBC
Platelets: 282 10*3/uL (ref 150–400)
RBC: 3.05 MIL/uL — ABNORMAL LOW (ref 4.22–5.81)
WBC: 20.1 10*3/uL — ABNORMAL HIGH (ref 4.0–10.5)

## 2011-02-27 LAB — GLUCOSE, CAPILLARY

## 2011-02-27 LAB — CHOLESTEROL, TOTAL: Cholesterol: 57 mg/dL (ref 0–200)

## 2011-02-27 LAB — DIFFERENTIAL
Lymphocytes Relative: 10 % — ABNORMAL LOW (ref 12–46)
Lymphs Abs: 2 10*3/uL (ref 0.7–4.0)
Neutrophils Relative %: 80 % — ABNORMAL HIGH (ref 43–77)

## 2011-02-27 LAB — PREALBUMIN: Prealbumin: 5 mg/dL — ABNORMAL LOW (ref 17.0–34.0)

## 2011-02-27 MED ORDER — FENTANYL CITRATE 0.05 MG/ML IJ SOLN
INTRAMUSCULAR | Status: DC | PRN
  Administered 2011-02-27 (×2): 25 ug via INTRAVENOUS

## 2011-02-27 MED ORDER — FENTANYL CITRATE 0.05 MG/ML IJ SOLN
INTRAMUSCULAR | Status: AC
  Filled 2011-02-27: qty 4

## 2011-02-27 MED ORDER — TRACE MINERALS CR-CU-MN-SE-ZN 10-1000-500-60 MCG/ML IV SOLN
INTRAVENOUS | Status: AC
  Administered 2011-02-27: 17:00:00 via INTRAVENOUS
  Filled 2011-02-27: qty 2280

## 2011-02-27 MED ORDER — FAT EMULSION 20 % IV EMUL
250.0000 mL | INTRAVENOUS | Status: AC
  Administered 2011-02-27: 250 mL via INTRAVENOUS
  Filled 2011-02-27: qty 250

## 2011-02-27 MED ORDER — HYDROMORPHONE 0.3 MG/ML IV SOLN
INTRAVENOUS | Status: AC
  Filled 2011-02-27: qty 25

## 2011-02-27 MED ORDER — HYDROMORPHONE 0.3 MG/ML IV SOLN
INTRAVENOUS | Status: AC
  Administered 2011-02-27: 4.2 mg
  Filled 2011-02-27: qty 25

## 2011-02-27 MED ORDER — MIDAZOLAM HCL 5 MG/5ML IJ SOLN
INTRAMUSCULAR | Status: DC | PRN
  Administered 2011-02-27: 2 mg via INTRAVENOUS

## 2011-02-27 MED ORDER — MIDAZOLAM HCL 2 MG/2ML IJ SOLN
INTRAMUSCULAR | Status: AC
  Filled 2011-02-27: qty 6

## 2011-02-27 MED ORDER — PHENOL 1.4 % MT LIQD
1.0000 | OROMUCOSAL | Status: DC | PRN
  Administered 2011-02-27: 1 via OROMUCOSAL
  Filled 2011-02-27: qty 177

## 2011-02-27 MED ORDER — PIPERACILLIN-TAZOBACTAM 3.375 G IVPB
3.3750 g | Freq: Three times a day (TID) | INTRAVENOUS | Status: DC
  Administered 2011-02-27 – 2011-03-13 (×42): 3.375 g via INTRAVENOUS
  Filled 2011-02-27 (×44): qty 50
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
## 2011-02-27 NOTE — Progress Notes (Signed)
PT Cancellation Note  Treatment cancelled as pt's HR maintaining around 147, pt declining any activity including simple LE bed exercises. Pt hopeful he will feel better after surgery. Will continue to follow.   Thanks!  Sheryl Saintil (Beverely Pace) Carleene Mains PT, DPT Acute Rehabilitation [phone]

## 2011-02-27 NOTE — Progress Notes (Signed)
Trauma Service Note  Subjective: Patient still having a lot of abdominal pain.  About 300cc/shift of NGT output  Objective: Vital signs in last 24 hours: Temp:  [99.1 F (37.3 C)-100.1 F (37.8 C)] 99.4 F (37.4 C) (02/25 0305) Pulse Rate:  [118-148] 118  (02/25 0600) Resp:  [19-28] 20  (02/25 0335) BP: (105-117)/(46-69) 117/69 mmHg (02/25 0305) SpO2:  [90 %-93 %] 90 % (02/25 0335) FiO2 (%):  [0 %] 0 % (02/25 0335) Weight:  [88.7 kg (195 lb 8.8 oz)] 88.7 kg (195 lb 8.8 oz) (02/25 0706) Last BM Date: 02/21/11  Intake/Output from previous day: 02/24 0701 - 02/25 0700 In: 2645 [I.V.:460; PPI:9518] Out: 2860 [Urine:2450; Emesis/NG output:400] Intake/Output this shift:    General: No acute distress with rest.  Grimaces when we push on his abdomen or touch him with stethoscope  Lungs: clear.  Oxygen saturation only 93%  Abd: Soft, no bowel sounds.  NGT output as mentioned.  Bilious.  VAC on central part of wound.  Extremities: No DVT signs or symptoms.  Neuro: Intact.  Lab Results: CBC   Basename 02/27/11 0325 02/26/11 0419  WBC 20.1* 24.4*  HGB 9.0* 9.3*  HCT 26.5* 27.4*  PLT 282 279   BMET  Basename 02/27/11 0325 02/26/11 0419  NA 132* 132*  K 3.7 3.5  CL 97 97  CO2 28 27  GLUCOSE 135* 147*  BUN 9 10  CREATININE 0.68 0.67  CALCIUM 8.2* 8.2*   PT/INR No results found for this basename: LABPROT:2,INR:2 in the last 72 hours ABG No results found for this basename: PHART:2,PCO2:2,PO2:2,HCO3:2 in the last 72 hours  Studies/Results: Ct Angio Chest W/cm &/or Wo Cm  02/26/2011  *RADIOLOGY REPORT*  Clinical Data:  Abdominal pain  CT ANGIOGRAPHY CHEST WITH CONTRAST  Technique:  Multidetector CT imaging of the chest was performed using the standard protocol during bolus administration of intravenous contrast.  Multiplanar CT image reconstructions including MIPs were obtained to evaluate the vascular anatomy.  Contrast: OMNIPAQUE IOHEXOL 350 MG/ML IV SOLN   Comparison:   None.  Findings:  Respiratory motion limits the exam.  No obvious filling defect in the pulmonary arterial tree is present to suggest acute pulmonary thromboembolism.  NG tube is in place, extending across the gastroesophageal junction.  Small nodes are scattered throughout the mediastinum.  Moderate left pleural effusion.  There is associated airspace disease and/or collapse in the left lower lobe as well as volume loss in the left upper lobe.  Dependent consolidation in the right lower lobe.  No pneumothorax.  Stable minimally displaced left eighth rib fracture.  No evidence of aortic dissection, transection, or aneurysm.  Review of the MIP images confirms the above findings.  IMPRESSION: No evidence of pulmonary thromboembolism or aortic dissection.  Moderate left pleural effusion.  Airspace disease and/or collapse in the left upper lobe, left lower lobe, and right lower lobe.  CT ABDOMEN AND PELVIS WITH CONTRAST  Technique:  Multidetector CT imaging of the abdomen and pelvis was performed using the standard protocol following bolus administration of intravenous contrast.  Findings:  Free intraperitoneal gas is present anterior to the left lobe of the liver and in the dependent portion of the mid peritoneal space. The patient underwent laparotomy on 02/ 20/2013.  There is a large loculated fluid collection with enhancing rim extending from the right lower quadrant into the pelvis worrisome for abscess formation.  Gas bubbles are contained within this abnormality.  Postoperative changes from bowel anastomosis in the  right lower quadrant are again noted.  Small hypodensity in the left lobe of the liver is stable.  Right hepatic lobe lesion at the dome is stable.  NG tube tip is in the body of the stomach.  Stranding is seen throughout the peritoneal fat.  There are loculated fluid collections in the left side of the abdomen as well.  The catheter decompresses the bladder.  Contrast has passed from the  small bowel into the large bowel across the anastomosis.  There is no definite extravasation of contrast to suggest leak.  IMPRESSION: Interval postoperative changes with infusion of lysis of adhesions. Contrast has passed into the large bowel.  Large multiple fluid collection is within the abdomen extending into the pelvis.  Infected fluid collections cannot be excluded.  Free intraperitoneal gas most consistent with recent exploratory laparotomy.  This is assuming the recent operation was performed to 22,013.  Stable liver lesions.  NG tube placed.  Otherwise stable exam.  Original Report Authenticated By: Donavan Burnet, M.D.   Ct Abdomen Pelvis W Contrast  02/26/2011  *RADIOLOGY REPORT*  Clinical Data:  Abdominal pain  CT ANGIOGRAPHY CHEST WITH CONTRAST  Technique:  Multidetector CT imaging of the chest was performed using the standard protocol during bolus administration of intravenous contrast.  Multiplanar CT image reconstructions including MIPs were obtained to evaluate the vascular anatomy.  Contrast: OMNIPAQUE IOHEXOL 350 MG/ML IV SOLN  Comparison:   None.  Findings:  Respiratory motion limits the exam.  No obvious filling defect in the pulmonary arterial tree is present to suggest acute pulmonary thromboembolism.  NG tube is in place, extending across the gastroesophageal junction.  Small nodes are scattered throughout the mediastinum.  Moderate left pleural effusion.  There is associated airspace disease and/or collapse in the left lower lobe as well as volume loss in the left upper lobe.  Dependent consolidation in the right lower lobe.  No pneumothorax.  Stable minimally displaced left eighth rib fracture.  No evidence of aortic dissection, transection, or aneurysm.  Review of the MIP images confirms the above findings.  IMPRESSION: No evidence of pulmonary thromboembolism or aortic dissection.  Moderate left pleural effusion.  Airspace disease and/or collapse in the left upper lobe, left  lower lobe, and right lower lobe.  CT ABDOMEN AND PELVIS WITH CONTRAST  Technique:  Multidetector CT imaging of the abdomen and pelvis was performed using the standard protocol following bolus administration of intravenous contrast.  Findings:  Free intraperitoneal gas is present anterior to the left lobe of the liver and in the dependent portion of the mid peritoneal space. The patient underwent laparotomy on 02/ 20/2013.  There is a large loculated fluid collection with enhancing rim extending from the right lower quadrant into the pelvis worrisome for abscess formation.  Gas bubbles are contained within this abnormality.  Postoperative changes from bowel anastomosis in the right lower quadrant are again noted.  Small hypodensity in the left lobe of the liver is stable.  Right hepatic lobe lesion at the dome is stable.  NG tube tip is in the body of the stomach.  Stranding is seen throughout the peritoneal fat.  There are loculated fluid collections in the left side of the abdomen as well.  The catheter decompresses the bladder.  Contrast has passed from the small bowel into the large bowel across the anastomosis.  There is no definite extravasation of contrast to suggest leak.  IMPRESSION: Interval postoperative changes with infusion of lysis of adhesions. Contrast  has passed into the large bowel.  Large multiple fluid collection is within the abdomen extending into the pelvis.  Infected fluid collections cannot be excluded.  Free intraperitoneal gas most consistent with recent exploratory laparotomy.  This is assuming the recent operation was performed to 22,013.  Stable liver lesions.  NG tube placed.  Otherwise stable exam.  Original Report Authenticated By: Donavan Burnet, M.D.    Anti-infectives: Anti-infectives     Start     Dose/Rate Route Frequency Ordered Stop   02/26/11 1430  piperacillin-tazobactam (ZOSYN) IVPB 3.375 g       3.375 g 12.5 mL/hr over 240 Minutes Intravenous 4 times per day  02/26/11 1421     02/15/11 1600   ertapenem (INVANZ) 1 g in sodium chloride 0.9 % 50 mL IVPB  Status:  Discontinued        1 g 100 mL/hr over 30 Minutes Intravenous Every 24 hours 02/14/11 1634 02/15/11 0813   02/14/11 1715   ertapenem (INVANZ) 1 g in sodium chloride 0.9 % 50 mL IVPB        1 g 100 mL/hr over 30 Minutes Intravenous To [facility] 02/14/11 1709 02/14/11 1753   02/14/11 1615   Ampicillin-Sulbactam (UNASYN) 3 g in sodium chloride 0.9 % 100 mL IVPB  Status:  Discontinued        3 g 100 mL/hr over 60 Minutes Intravenous Every 6 hours 02/14/11 1603 02/14/11 1634          Assessment/Plan: s/p Procedure(s): EXPLORATORY LAPAROTOMY WBC down a bit, but still needs to be drained today. Last CT showed what appeared to be a significant effusion on the left.  Will repeat CXR today. Going to IR for percutaneous aspiration and possible drainage catheter in abdominal fluid collections. May need thoracentesis in future.  LOS: 13 days   Marta Lamas. Gae Bon, MD, FACS [phone] Trauma Surgeon 02/27/2011

## 2011-02-27 NOTE — Progress Notes (Signed)
Wound vac dressing changed, noted small upper area of wound had thick yellow drainage when removed dressing, notified Craig rayburn pa. Came and assessed wound. D/c wound vac and change to wet to dry. Measurement are 16.5cm length, width upper part 1cm mid part of wound 2cm and bottom of wound 2.5cm and wound is 1cm deep. Pt tolerated procedure well. Using pca for pain relief. Craig Garrett
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
## 2011-02-27 NOTE — Procedures (Signed)
CT Fluoro RLQ abdominal abscess drain No comp 30cc fluid aspirated GS/CX sent Keep to gravity bag

## 2011-02-28 LAB — DIFFERENTIAL
Basophils Relative: 0 % (ref 0–1)
Eosinophils Absolute: 0.2 10*3/uL (ref 0.0–0.7)
Eosinophils Relative: 1 % (ref 0–5)
Lymphocytes Relative: 11 % — ABNORMAL LOW (ref 12–46)
Neutrophils Relative %: 76 % (ref 43–77)

## 2011-02-28 LAB — CBC
Hemoglobin: 8.7 g/dL — ABNORMAL LOW (ref 13.0–17.0)
RBC: 2.99 MIL/uL — ABNORMAL LOW (ref 4.22–5.81)
WBC: 16.8 10*3/uL — ABNORMAL HIGH (ref 4.0–10.5)

## 2011-02-28 LAB — GLUCOSE, CAPILLARY
Glucose-Capillary: 157 mg/dL — ABNORMAL HIGH (ref 70–99)
Glucose-Capillary: 133 mg/dL — ABNORMAL HIGH (ref 70–99)
Glucose-Capillary: 110 mg/dL — ABNORMAL HIGH (ref 70–99)

## 2011-02-28 LAB — BASIC METABOLIC PANEL
CO2: 28 mEq/L (ref 19–32)
GFR calc non Af Amer: >90 mL/min (ref >90)
Glucose, Bld: 132 mg/dL — ABNORMAL HIGH (ref 70–99)
Potassium: 3.8 mEq/L (ref 3.5–5.1)
Sodium: 131 mEq/L — ABNORMAL LOW (ref 135–145)

## 2011-02-28 LAB — HEMOGLOBIN AND HEMATOCRIT, BLOOD
HCT: 29.8 % — ABNORMAL LOW (ref 39.0–52.0)
Hemoglobin: 10.3 g/dL — ABNORMAL LOW (ref 13.0–17.0)

## 2011-02-28 MED ORDER — METOPROLOL TARTRATE 1 MG/ML IV SOLN: 10.0000 mg | INTRAVENOUS | Status: DC

## 2011-02-28 MED ORDER — METOPROLOL TARTRATE 1 MG/ML IV SOLN
10.0000 mg | INTRAVENOUS | Status: DC
  Administered 2011-03-01 – 2011-03-08 (×32): 10 mg via INTRAVENOUS
  Filled 2011-02-28 (×48): qty 10

## 2011-02-28 MED ORDER — SODIUM CHLORIDE 0.9 % IV SOLN
12.5000 mg | Freq: Four times a day (QID) | INTRAVENOUS | Status: DC | PRN
  Administered 2011-03-04 – 2011-03-13 (×3): 12.5 mg via INTRAVENOUS
  Filled 2011-02-28 (×6): qty 0.5

## 2011-02-28 MED ORDER — HYDROMORPHONE 0.3 MG/ML IV SOLN
INTRAVENOUS | Status: AC
  Administered 2011-02-28: 7.5 mg via INTRAVENOUS
  Filled 2011-02-28: qty 25

## 2011-02-28 MED ORDER — METOPROLOL TARTRATE 1 MG/ML IV SOLN
10.0000 mg | Freq: Every day | INTRAVENOUS | Status: DC
  Administered 2011-02-28 (×4): 10 mg via INTRAVENOUS
  Filled 2011-02-28: qty 10

## 2011-02-28 MED ORDER — INSULIN REGULAR HUMAN 100 UNIT/ML IJ SOLN
INTRAVENOUS | Status: AC
  Administered 2011-02-28: 17:00:00 via INTRAVENOUS
  Filled 2011-02-28: qty 2280

## 2011-02-28 MED ORDER — HYDROMORPHONE 0.3 MG/ML IV SOLN
INTRAVENOUS | Status: AC
  Filled 2011-02-28: qty 25
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
## 2011-02-28 NOTE — Progress Notes (Signed)
Nutrition Follow-up  Diet Order:  NPO  Patient is receiving TPN with Clinimix E 5/15 @ 95 ml/hr.  Lipids (20% IVFE @ 10 ml/hr), multivitamins, and trace elements are provided 3 times weekly (MWF) due to national backorder.  Provides 1824 kcal and 114 grams protein daily (based on weekly average).  Meets 83% minimum estimated kcal and 100% minimum estimated protein needs.  Meds: Scheduled Meds:   . Chlorhexidine Gluconate Cloth  6 each Topical Q0600  . fentaNYL      . HYDROmorphone PCA 0.3 mg/mL   Intravenous Q4H  . HYDROmorphone PCA 0.3 mg/mL      . HYDROmorphone PCA 0.3 mg/mL      . HYDROmorphone PCA 0.3 mg/mL      . insulin aspart  0-9 Units Subcutaneous Q8H  . metoprolol  10 mg Intravenous 6 X Daily  . midazolam      . mupirocin ointment   Nasal BID  . piperacillin-tazobactam (ZOSYN)  IV  3.375 g Intravenous Q8H  . polyvinyl alcohol  1 drop Right Eye QID  . DISCONTD: chlorproMAZINE (THORAZINE) IV  25 mg Intravenous Q6H  . DISCONTD: metoprolol  10 mg Intravenous Q6H   Continuous Infusions:   . 0.45 % NaCl with KCl 20 mEq / L 20 mL/hr (02/27/11 0855)  . fat emulsion 250 mL (02/27/11 1703)  . TPN (CLINIMIX) +/- additives 95 mL/hr at 02/26/11 1726  . TPN (CLINIMIX) +/- additives    . TPN (CLINIMIX) +/- additives 95 mL/hr at 02/27/11 1702   PRN Meds:.chlorproMAZINE (THORAZINE) IV, diphenhydrAMINE, diphenhydrAMINE, fentaNYL, metoprolol, midazolam, naloxone, phenol, sodium chloride, sodium chloride, sodium chloride  Labs:  CMP     Component Value Date/Time   NA 131* 02/28/2011 0312   K 3.8 02/28/2011 0312   CL 96 02/28/2011 0312   CO2 28 02/28/2011 0312   GLUCOSE 132* 02/28/2011 0312   BUN 9 02/28/2011 0312   CREATININE 0.69 02/28/2011 0312   CALCIUM 8.4 02/28/2011 0312   PROT 5.2* 02/27/2011 0325   ALBUMIN 1.7* 02/27/2011 0325   AST 68* 02/27/2011 0325   ALT 65* 02/27/2011 0325   ALKPHOS 167* 02/27/2011 0325   BILITOT 2.0* 02/27/2011 0325   GFRNONAA >90 02/28/2011 0312   GFRAA >90  02/28/2011 0312   CBG (last 3)   Basename 02/28/11 0556 02/27/11 2143 02/27/11 1443  GLUCAP 157* 142* 129*    Intake/Output Summary (Last 24 hours) at 02/28/11 1051 Last data filed at 02/28/11 0600  Gross per 24 hour  Intake   2374 ml  Output   2575 ml  Net   -201 ml    Weight Status:  85.5 kg (stable)  Re-estimated needs:  2200-2400 kcals, 110-120 grams protein daily  Per MD note, patient with bowel sounds this morning.  Ileus improving.  Wound VAC was discontinued yesterday.  TPN is not quite meeting calorie needs due to national lipid backorder.  Nutrition Dx:  Inadequate oral intake, ongoing.  Goal:  TPN to meet >90% of estimated protein needs, maximize energy provision as able during national lipid backorder, currently met.  Unable to meet >90% of estimated calorie needs due to lipid backorder.  Intervention:    Continue TPN.  RD to assist with transition to PO diet as patient is able.  Monitor:  TPN tolerance, labs, weight trend, diet advancement.   Hettie Holstein Pager #:  [phone]
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
## 2011-02-28 NOTE — Progress Notes (Signed)
6 Days Post-Op  Subjective: RLQ abscess drain placed 2/25 Pt feels some better  Objective: Vital signs in last 24 hours: Temp:  [98 F (36.7 C)-99.9 F (37.7 C)] 98.1 F (36.7 C) (02/26 1235) Pulse Rate:  [114-144] 114  (02/26 1240) Resp:  [18-25] 23  (02/26 1240) BP: (95-120)/(55-84) 103/70 mmHg (02/26 1235) SpO2:  [91 %-97 %] 91 % (02/26 1215) FiO2 (%):  [0 %-21 %] 21 % (02/26 0313) Weight:  [188 lb 7.9 oz (85.5 kg)] 188 lb 7.9 oz (85.5 kg) (02/26 0300) Last BM Date: 02/21/11  Intake/Output from previous day: 02/25 0701 - 02/26 0700 In: 3063 [I.V.:443; IV Piggyback:400; TPN:2210] Out: 3125 [Urine:2250; Emesis/NG output:575; Drains:300] Intake/Output this shift: Total I/O In: 1137.5 [I.V.:370; Blood:137.5; TPN:630] Out: 980 [Urine:725; Emesis/NG output:225; Drains:30]  PE:  VSS; afeb Site of drain  Is intact;  NT; no bleeding; clean and dry Output 225cc 2/25              30cc today and 20 cc in bag now: blood tinged purulent Cx neg so far  Lab Results:   [facility] 02/28/11 0312 02/27/11 0325  WBC 16.8* 20.1*  HGB 8.7* 9.0*  HCT 26.0* 26.5*  PLT 324 282   BMET  Basename 02/28/11 0312 02/27/11 0325  NA 131* 132*  K 3.8 3.7  CL 96 97  CO2 28 28  GLUCOSE 132* 135*  BUN 9 9  CREATININE 0.69 0.68  CALCIUM 8.4 8.2*   PT/INR  Basename 02/27/11 0940  LABPROT 20.0*  INR 1.67*   ABG No results found for this basename: PHART:2,PCO2:2,PO2:2,HCO3:2 in the last 72 hours  Studies/Results: Ct Angio Chest W/cm &/or Wo Cm  02/26/2011  *RADIOLOGY REPORT*  Clinical Data:  Abdominal pain  CT ANGIOGRAPHY CHEST WITH CONTRAST  Technique:  Multidetector CT imaging of the chest was performed using the standard protocol during bolus administration of intravenous contrast.  Multiplanar CT image reconstructions including MIPs were obtained to evaluate the vascular anatomy.  Contrast: OMNIPAQUE IOHEXOL 350 MG/ML IV SOLN  Comparison:   None.  Findings:  Respiratory motion  limits the exam.  No obvious filling defect in the pulmonary arterial tree is present to suggest acute pulmonary thromboembolism.  NG tube is in place, extending across the gastroesophageal junction.  Small nodes are scattered throughout the mediastinum.  Moderate left pleural effusion.  There is associated airspace disease and/or collapse in the left lower lobe as well as volume loss in the left upper lobe.  Dependent consolidation in the right lower lobe.  No pneumothorax.  Stable minimally displaced left eighth rib fracture.  No evidence of aortic dissection, transection, or aneurysm.  Review of the MIP images confirms the above findings.  IMPRESSION: No evidence of pulmonary thromboembolism or aortic dissection.  Moderate left pleural effusion.  Airspace disease and/or collapse in the left upper lobe, left lower lobe, and right lower lobe.  CT ABDOMEN AND PELVIS WITH CONTRAST  Technique:  Multidetector CT imaging of the abdomen and pelvis was performed using the standard protocol following bolus administration of intravenous contrast.  Findings:  Free intraperitoneal gas is present anterior to the left lobe of the liver and in the dependent portion of the mid peritoneal space. The patient underwent laparotomy on 02/ 20/2013.  There is a large loculated fluid collection with enhancing rim extending from the right lower quadrant into the pelvis worrisome for abscess formation.  Gas bubbles are contained within this abnormality.  Postoperative changes from bowel anastomosis in the  right lower quadrant are again noted.  Small hypodensity in the left lobe of the liver is stable.  Right hepatic lobe lesion at the dome is stable.  NG tube tip is in the body of the stomach.  Stranding is seen throughout the peritoneal fat.  There are loculated fluid collections in the left side of the abdomen as well.  The catheter decompresses the bladder.  Contrast has passed from the small bowel into the large bowel across the  anastomosis.  There is no definite extravasation of contrast to suggest leak.  IMPRESSION: Interval postoperative changes with infusion of lysis of adhesions. Contrast has passed into the large bowel.  Large multiple fluid collection is within the abdomen extending into the pelvis.  Infected fluid collections cannot be excluded.  Free intraperitoneal gas most consistent with recent exploratory laparotomy.  This is assuming the recent operation was performed to 22,013.  Stable liver lesions.  NG tube placed.  Otherwise stable exam.  Original Report Authenticated By: Donavan Burnet, M.D.   Ct Guided Abscess Drain  02/27/2011  *RADIOLOGY REPORT*  Clinical Data: Postoperative abdominal fluid collection, concerning for abscess  CT FLUORO GUIDED RIGHT LOWER QUADRANT ABDOMINAL ABSCESS DRAIN PLACEMENT  Date:  02/27/2011 12:40:00  Radiologist:  M. Ruel Favors, M.D.  Medications:  15 mcg Fentanyl, 2 mg Versed  Guidance:  CT fluoroscopy  Fluoroscopy time:  4 seconds  Sedation time:  10 minutes  Contrast volume:  None.  Complications:  No immediate  PROCEDURE/FINDINGS:  Informed consent was obtained from the patient following explanation of the procedure, risks, benefits and alternatives. The patient understands, agrees and consents for the procedure. All questions were addressed.  A time out was performed.  Maximal barrier sterile technique utilized including caps, mask, sterile gowns, sterile gloves, large sterile drape, hand hygiene, and betadine  Previous imaging reviewed.  The patient was positioned supine. Noncontrast localization CT performed through the lower abdomen. Dominant right lower quadrant fluid collection localized.  Under sterile conditions and local anesthesia, an 18 gauge 10 cm access needle was advanced from a right lower quadrant oblique approach. Needle position confirmed.  Guide wire advanced followed by tract dilatation to insert a 14-French drain.  Position confirmed with CT fluoroscopy.  Syringe  aspiration yielded serosanguinous fluid with small blood clots.  Samples sent for Gram stain and culture. Catheter secured with a Prolene suture and connected to external gravity drainage.  Sterile dressing applied.  No immediate complication.  The patient tolerated the procedure well.  IMPRESSION: Successful CT fluoroscopy guided right lower quadrant 14-French abscess drain insertion.  Original Report Authenticated By: Judie Petit. Ruel Favors, M.D.   Ct Abdomen Pelvis W Contrast  02/26/2011  *RADIOLOGY REPORT*  Clinical Data:  Abdominal pain  CT ANGIOGRAPHY CHEST WITH CONTRAST  Technique:  Multidetector CT imaging of the chest was performed using the standard protocol during bolus administration of intravenous contrast.  Multiplanar CT image reconstructions including MIPs were obtained to evaluate the vascular anatomy.  Contrast: OMNIPAQUE IOHEXOL 350 MG/ML IV SOLN  Comparison:   None.  Findings:  Respiratory motion limits the exam.  No obvious filling defect in the pulmonary arterial tree is present to suggest acute pulmonary thromboembolism.  NG tube is in place, extending across the gastroesophageal junction.  Small nodes are scattered throughout the mediastinum.  Moderate left pleural effusion.  There is associated airspace disease and/or collapse in the left lower lobe as well as volume loss in the left upper lobe.  Dependent consolidation in  the right lower lobe.  No pneumothorax.  Stable minimally displaced left eighth rib fracture.  No evidence of aortic dissection, transection, or aneurysm.  Review of the MIP images confirms the above findings.  IMPRESSION: No evidence of pulmonary thromboembolism or aortic dissection.  Moderate left pleural effusion.  Airspace disease and/or collapse in the left upper lobe, left lower lobe, and right lower lobe.  CT ABDOMEN AND PELVIS WITH CONTRAST  Technique:  Multidetector CT imaging of the abdomen and pelvis was performed using the standard protocol following bolus  administration of intravenous contrast.  Findings:  Free intraperitoneal gas is present anterior to the left lobe of the liver and in the dependent portion of the mid peritoneal space. The patient underwent laparotomy on 02/ 20/2013.  There is a large loculated fluid collection with enhancing rim extending from the right lower quadrant into the pelvis worrisome for abscess formation.  Gas bubbles are contained within this abnormality.  Postoperative changes from bowel anastomosis in the right lower quadrant are again noted.  Small hypodensity in the left lobe of the liver is stable.  Right hepatic lobe lesion at the dome is stable.  NG tube tip is in the body of the stomach.  Stranding is seen throughout the peritoneal fat.  There are loculated fluid collections in the left side of the abdomen as well.  The catheter decompresses the bladder.  Contrast has passed from the small bowel into the large bowel across the anastomosis.  There is no definite extravasation of contrast to suggest leak.  IMPRESSION: Interval postoperative changes with infusion of lysis of adhesions. Contrast has passed into the large bowel.  Large multiple fluid collection is within the abdomen extending into the pelvis.  Infected fluid collections cannot be excluded.  Free intraperitoneal gas most consistent with recent exploratory laparotomy.  This is assuming the recent operation was performed to 22,013.  Stable liver lesions.  NG tube placed.  Otherwise stable exam.  Original Report Authenticated By: Donavan Burnet, M.D.   Dg Chest Port 1 View  02/27/2011  *RADIOLOGY REPORT*  Clinical Data: Evaluate left lung effusion  PORTABLE CHEST - 1 VIEW  Comparison: CT chest dated 02/26/2011  Findings: Moderate left pleural effusion. Patchy right lower lobe opacity, atelectasis versus pneumonia.  No pneumothorax.  Endotracheal tube courses below the diaphragm.  The heart is normal in size.  IMPRESSION: Moderate left pleural effusion.  Patchy right  lower lobe opacity, atelectasis versus pneumonia.  Original Report Authenticated By: Charline Bills, M.D.    Anti-infectives: Anti-infectives     Start     Dose/Rate Route Frequency Ordered Stop   02/27/11 1400  piperacillin-tazobactam (ZOSYN) IVPB 3.375 g       3.375 g 12.5 mL/hr over 240 Minutes Intravenous 3 times per day 02/27/11 1027     02/26/11 1430   piperacillin-tazobactam (ZOSYN) IVPB 3.375 g  Status:  Discontinued        3.375 g 12.5 mL/hr over 240 Minutes Intravenous 4 times per day 02/26/11 1421 02/27/11 1027   02/15/11 1600   ertapenem (INVANZ) 1 g in sodium chloride 0.9 % 50 mL IVPB  Status:  Discontinued        1 g 100 mL/hr over 30 Minutes Intravenous Every 24 hours 02/14/11 1634 02/15/11 0813   02/14/11 1715   ertapenem (INVANZ) 1 g in sodium chloride 0.9 % 50 mL IVPB        1 g 100 mL/hr over 30 Minutes Intravenous To [facility]  02/14/11 1709 02/14/11 1753   02/14/11 1615   Ampicillin-Sulbactam (UNASYN) 3 g in sodium chloride 0.9 % 100 mL IVPB  Status:  Discontinued        3 g 100 mL/hr over 60 Minutes Intravenous Every 6 hours 02/14/11 1603 02/14/11 1634          Assessment/Plan: s/p Procedure(s) (LRB): EXPLORATORY LAPAROTOMY (N/A)  RLQ abscess drain placed 2/25 Some better today Output good cx no growth so far Will follow   Aymar Whitfill A 02/28/2011

## 2011-03-01 ENCOUNTER — Inpatient Hospital Stay (HOSPITAL_COMMUNITY): Payer: Medicaid Other

## 2011-03-01 LAB — BASIC METABOLIC PANEL
CO2: 27 mEq/L (ref 19–32)
Chloride: 98 mEq/L (ref 96–112)
Potassium: 3.8 mEq/L (ref 3.5–5.1)
Sodium: 132 mEq/L — ABNORMAL LOW (ref 135–145)

## 2011-03-01 LAB — TYPE AND SCREEN
ABO/RH(D): O POS
Unit division: 0
Unit division: 0

## 2011-03-01 LAB — GLUCOSE, CAPILLARY: Glucose-Capillary: 141 mg/dL — ABNORMAL HIGH (ref 70–99)

## 2011-03-01 LAB — CBC
Platelets: 330 10*3/uL (ref 150–400)
RBC: 3.57 MIL/uL — ABNORMAL LOW (ref 4.22–5.81)
WBC: 15.7 10*3/uL — ABNORMAL HIGH (ref 4.0–10.5)

## 2011-03-01 MED ORDER — HYDROMORPHONE 0.3 MG/ML IV SOLN
INTRAVENOUS | Status: AC
  Administered 2011-03-01: 2.7 mg
  Filled 2011-03-01: qty 25

## 2011-03-01 MED ORDER — HYDROMORPHONE 0.3 MG/ML IV SOLN
INTRAVENOUS | Status: AC
  Filled 2011-03-01: qty 25

## 2011-03-01 MED ORDER — FAT EMULSION 20 % IV EMUL
250.0000 mL | INTRAVENOUS | Status: AC
  Administered 2011-03-01: 250 mL via INTRAVENOUS
  Filled 2011-03-01: qty 250

## 2011-03-01 MED ORDER — TRACE MINERALS CR-CU-MN-SE-ZN 10-1000-500-60 MCG/ML IV SOLN
INTRAVENOUS | Status: AC
  Administered 2011-03-01: 19:00:00 via INTRAVENOUS
  Filled 2011-03-01: qty 2280

## 2011-03-01 MED ORDER — INSULIN ASPART 100 UNIT/ML ~~LOC~~ SOLN
0.0000 [IU] | Freq: Two times a day (BID) | SUBCUTANEOUS | Status: DC
  Administered 2011-03-02 – 2011-03-13 (×14): 1 [IU] via SUBCUTANEOUS

## 2011-03-01 NOTE — Progress Notes (Signed)
Trauma Service Note  Subjective: The patient states that he has felt better.  No bowel movement or flatus.  Objective: Vital signs in last 24 hours: Temp:  [97.9 F (36.6 C)-99 F (37.2 C)] 98.2 F (36.8 C) (02/27 0328) Pulse Rate:  [112-141] 121  (02/26 2008) Resp:  [15-32] 15  (02/27 0451) BP: (98-114)/(66-75) 114/75 mmHg (02/27 0328) SpO2:  [91 %-97 %] 96 % (02/27 0451) Last BM Date:  (PTA)  Intake/Output from previous day: 02/26 0701 - 02/27 0700 In: 3973.3 [I.V.:960; Blood:608.3; IV Piggyback:100; TPN:2285] Out: 3070 [Urine:2525; Emesis/NG output:475; Drains:70] Intake/Output this shift:    General: No acute distress, Pain appears to be better controlled.  Lungs: Decreased slightly on the left.  No rales or rhonchi.  Abd: Soft, Has good bowel sounds.  Midline wound is red with spot areas of fatty necrosis.  Total of 70 ml out of drain yesterday, 300 ml the day before.  Minimal out today, slightly bile tinged.  Not pus  Extremities: No DVT signs or symptoms.  Neuro: completely intact.  Lab Results: CBC   Basename 03/01/11 0424 02/28/11 2120 02/28/11 0312  WBC 15.7* -- 16.8*  HGB 10.3* 10.3* --  HCT 29.7* 29.8* --  PLT 330 -- 324   BMET  Basename 03/01/11 0424 02/28/11 0312  NA 132* 131*  K 3.8 3.8  CL 98 96  CO2 27 28  GLUCOSE 128* 132*  BUN 10 9  CREATININE 0.69 0.69  CALCIUM 8.4 8.4   PT/INR  Basename 02/27/11 0940  LABPROT 20.0*  INR 1.67*   ABG No results found for this basename: PHART:2,PCO2:2,PO2:2,HCO3:2 in the last 72 hours  Studies/Results: Ct Guided Abscess Drain  02/27/2011  *RADIOLOGY REPORT*  Clinical Data: Postoperative abdominal fluid collection, concerning for abscess  CT FLUORO GUIDED RIGHT LOWER QUADRANT ABDOMINAL ABSCESS DRAIN PLACEMENT  Date:  02/27/2011 12:40:00  Radiologist:  Judie Petit. Ruel Favors, M.D.  Medications:  15 mcg Fentanyl, 2 mg Versed  Guidance:  CT fluoroscopy  Fluoroscopy time:  4 seconds  Sedation time:  10 minutes   Contrast volume:  None.  Complications:  No immediate  PROCEDURE/FINDINGS:  Informed consent was obtained from the patient following explanation of the procedure, risks, benefits and alternatives. The patient understands, agrees and consents for the procedure. All questions were addressed.  A time out was performed.  Maximal barrier sterile technique utilized including caps, mask, sterile gowns, sterile gloves, large sterile drape, hand hygiene, and betadine  Previous imaging reviewed.  The patient was positioned supine. Noncontrast localization CT performed through the lower abdomen. Dominant right lower quadrant fluid collection localized.  Under sterile conditions and local anesthesia, an 18 gauge 10 cm access needle was advanced from a right lower quadrant oblique approach. Needle position confirmed.  Guide wire advanced followed by tract dilatation to insert a 14-French drain.  Position confirmed with CT fluoroscopy.  Syringe aspiration yielded serosanguinous fluid with small blood clots.  Samples sent for Gram stain and culture. Catheter secured with a Prolene suture and connected to external gravity drainage.  Sterile dressing applied.  No immediate complication.  The patient tolerated the procedure well.  IMPRESSION: Successful CT fluoroscopy guided right lower quadrant 14-French abscess drain insertion.  Original Report Authenticated By: Judie Petit. Ruel Favors, M.D.   Dg Chest Port 1 View  03/01/2011  *RADIOLOGY REPORT*  Clinical Data: Follow up of left pleural effusion  PORTABLE CHEST - 1 VIEW  Comparison: Portable chest x-ray of 02/27/2011  Findings: There is little change and  aeration.  Pleural and parenchymal opacities on the left persist, with little change in right basilar atelectasis.  Right upper extremity PICC line is unchanged in position and an NG tube is present.  Mild cardiomegaly is stable.  IMPRESSION: Little change in pleural and parenchymal opacities on the left and probable right basilar  atelectasis.  Original Report Authenticated By: Juline Patch, M.D.   Dg Chest Port 1 View  02/27/2011  *RADIOLOGY REPORT*  Clinical Data: Evaluate left lung effusion  PORTABLE CHEST - 1 VIEW  Comparison: CT chest dated 02/26/2011  Findings: Moderate left pleural effusion. Patchy right lower lobe opacity, atelectasis versus pneumonia.  No pneumothorax.  Endotracheal tube courses below the diaphragm.  The heart is normal in size.  IMPRESSION: Moderate left pleural effusion.  Patchy right lower lobe opacity, atelectasis versus pneumonia.  Original Report Authenticated By: Charline Bills, M.D.    Anti-infectives: Anti-infectives     Start     Dose/Rate Route Frequency Ordered Stop   02/27/11 1400  piperacillin-tazobactam (ZOSYN) IVPB 3.375 g       3.375 g 12.5 mL/hr over 240 Minutes Intravenous 3 times per day 02/27/11 1027     02/26/11 1430   piperacillin-tazobactam (ZOSYN) IVPB 3.375 g  Status:  Discontinued        3.375 g 12.5 mL/hr over 240 Minutes Intravenous 4 times per day 02/26/11 1421 02/27/11 1027   02/15/11 1600   ertapenem (INVANZ) 1 g in sodium chloride 0.9 % 50 mL IVPB  Status:  Discontinued        1 g 100 mL/hr over 30 Minutes Intravenous Every 24 hours 02/14/11 1634 02/15/11 0813   02/14/11 1715   ertapenem (INVANZ) 1 g in sodium chloride 0.9 % 50 mL IVPB        1 g 100 mL/hr over 30 Minutes Intravenous [facility] 02/14/11 1709 02/14/11 1753   02/14/11 1615   Ampicillin-Sulbactam (UNASYN) 3 g in sodium chloride 0.9 % 100 mL IVPB  Status:  Discontinued        3 g 100 mL/hr over 60 Minutes Intravenous Every 6 hours 02/14/11 1603 02/14/11 1634          Assessment/Plan: s/p Procedure(s): EXPLORATORY LAPAROTOMY d/c foley Clamp NGT  LOS: 15 days   Marta Lamas. Gae Bon, MD, FACS [phone] Trauma Surgeon 03/01/2011
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
## 2011-03-01 NOTE — Progress Notes (Signed)
7 Days Post-Op  Subjective: RLQ abscess drain placed 2/25 Intact Wbc declining No growth on Cx  Objective: Vital signs in last 24 hours: Temp:  [97.9 F (36.6 C)-99 F (37.2 C)] 98.2 F (36.8 C) (02/27 0328) Pulse Rate:  [112-141] 121  (02/26 2008) Resp:  [15-32] 15  (02/27 0451) BP: (98-114)/(66-75) 114/75 mmHg (02/27 0328) SpO2:  [91 %-97 %] 96 % (02/27 0451) Last BM Date:  (PTA)  Intake/Output from previous day: 02/26 0701 - 02/27 0700 In: 3973.3 [I.V.:960; Blood:608.3; IV Piggyback:100; TPN:2285] Out: 3070 [Urine:2525; Emesis/NG output:475; Drains:70] Intake/Output this shift:    PE:  drain intact(RLQ) Site clean and dry No bleeding; NT Output minimal; serous in color No growth  Lab Results:   Basename 03/01/11 0424 02/28/11 2120 02/28/11 0312  WBC 15.7* -- 16.8*  HGB 10.3* 10.3* --  HCT 29.7* 29.8* --  PLT 330 -- 324   BMET  Basename 03/01/11 0424 02/28/11 0312  NA 132* 131*  K 3.8 3.8  CL 98 96  CO2 27 28  GLUCOSE 128* 132*  BUN 10 9  CREATININE 0.69 0.69  CALCIUM 8.4 8.4   PT/INR  Basename 02/27/11 0940  LABPROT 20.0*  INR 1.67*   ABG No results found for this basename: PHART:2,PCO2:2,PO2:2,HCO3:2 in the last 72 hours  Studies/Results: Ct Guided Abscess Drain  02/27/2011  *RADIOLOGY REPORT*  Clinical Data: Postoperative abdominal fluid collection, concerning for abscess  CT FLUORO GUIDED RIGHT LOWER QUADRANT ABDOMINAL ABSCESS DRAIN PLACEMENT  Date:  02/27/2011 12:40:00  Radiologist:  Judie Petit. Ruel Favors, M.D.  Medications:  15 mcg Fentanyl, 2 mg Versed  Guidance:  CT fluoroscopy  Fluoroscopy time:  4 seconds  Sedation time:  10 minutes  Contrast volume:  None.  Complications:  No immediate  PROCEDURE/FINDINGS:  Informed consent was obtained from the patient following explanation of the procedure, risks, benefits and alternatives. The patient understands, agrees and consents for the procedure. All questions were addressed.  A time out was performed.   Maximal barrier sterile technique utilized including caps, mask, sterile gowns, sterile gloves, large sterile drape, hand hygiene, and betadine  Previous imaging reviewed.  The patient was positioned supine. Noncontrast localization CT performed through the lower abdomen. Dominant right lower quadrant fluid collection localized.  Under sterile conditions and local anesthesia, an 18 gauge 10 cm access needle was advanced from a right lower quadrant oblique approach. Needle position confirmed.  Guide wire advanced followed by tract dilatation to insert a 14-French drain.  Position confirmed with CT fluoroscopy.  Syringe aspiration yielded serosanguinous fluid with small blood clots.  Samples sent for Gram stain and culture. Catheter secured with a Prolene suture and connected to external gravity drainage.  Sterile dressing applied.  No immediate complication.  The patient tolerated the procedure well.  IMPRESSION: Successful CT fluoroscopy guided right lower quadrant 14-French abscess drain insertion.  Original Report Authenticated By: Judie Petit. Ruel Favors, M.D.   Dg Chest Port 1 View  03/01/2011  *RADIOLOGY REPORT*  Clinical Data: Follow up of left pleural effusion  PORTABLE CHEST - 1 VIEW  Comparison: Portable chest x-ray of 02/27/2011  Findings: There is little change and aeration.  Pleural and parenchymal opacities on the left persist, with little change in right basilar atelectasis.  Right upper extremity PICC line is unchanged in position and an NG tube is present.  Mild cardiomegaly is stable.  IMPRESSION: Little change in pleural and parenchymal opacities on the left and probable right basilar atelectasis.  Original Report Authenticated By:  Juline Patch, M.D.   Dg Chest Port 1 View  02/27/2011  *RADIOLOGY REPORT*  Clinical Data: Evaluate left lung effusion  PORTABLE CHEST - 1 VIEW  Comparison: CT chest dated 02/26/2011  Findings: Moderate left pleural effusion. Patchy right lower lobe opacity, atelectasis  versus pneumonia.  No pneumothorax.  Endotracheal tube courses below the diaphragm.  The heart is normal in size.  IMPRESSION: Moderate left pleural effusion.  Patchy right lower lobe opacity, atelectasis versus pneumonia.  Original Report Authenticated By: Charline Bills, M.D.    Anti-infectives: Anti-infectives     Start     Dose/Rate Route Frequency Ordered Stop   02/27/11 1400  piperacillin-tazobactam (ZOSYN) IVPB 3.375 g       3.375 g 12.5 mL/hr over 240 Minutes Intravenous 3 times per day 02/27/11 1027     02/26/11 1430   piperacillin-tazobactam (ZOSYN) IVPB 3.375 g  Status:  Discontinued        3.375 g 12.5 mL/hr over 240 Minutes Intravenous 4 times per day 02/26/11 1421 02/27/11 1027   02/15/11 1600   ertapenem (INVANZ) 1 g in sodium chloride 0.9 % 50 mL IVPB  Status:  Discontinued        1 g 100 mL/hr over 30 Minutes Intravenous Every 24 hours 02/14/11 1634 02/15/11 0813   02/14/11 1715   ertapenem (INVANZ) 1 g in sodium chloride 0.9 % 50 mL IVPB        1 g 100 mL/hr over 30 Minutes Intravenous To [facility] 02/14/11 1709 02/14/11 1753   02/14/11 1615   Ampicillin-Sulbactam (UNASYN) 3 g in sodium chloride 0.9 % 100 mL IVPB  Status:  Discontinued        3 g 100 mL/hr over 60 Minutes Intravenous Every 6 hours 02/14/11 1603 02/14/11 1634          Assessment/Plan: s/p Procedure(s) (LRB): EXPLORATORY LAPAROTOMY (N/A)  RLQ drain placed 2/25 Output minimal; serous No growth Will need re CT soon to eval RLQ collection...pull?  Marua Qin A 03/01/2011

## 2011-03-01 NOTE — Progress Notes (Signed)
PT Cancellation Note  Treatment cancelled today due to patient's refusal to participate secondary to significant nausea. He states he will get up to the chair with nursing if his nausea improves and he states he also wants to wait and see how he feels as NGT now clamped.  Edwyna Perfect, PT  Pager [phone]  03/01/2011, 10:29 AM
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
## 2011-03-02 LAB — COMPREHENSIVE METABOLIC PANEL
ALT: 100 U/L — ABNORMAL HIGH (ref 0–53)
AST: 66 U/L — ABNORMAL HIGH (ref 0–37)
Albumin: 1.9 g/dL — ABNORMAL LOW (ref 3.5–5.2)
CO2: 26 mEq/L (ref 19–32)
Calcium: 9 mg/dL (ref 8.4–10.5)
Sodium: 131 mEq/L — ABNORMAL LOW (ref 135–145)
Total Protein: 6.5 g/dL (ref 6.0–8.3)

## 2011-03-02 LAB — GLUCOSE, CAPILLARY
Glucose-Capillary: 128 mg/dL — ABNORMAL HIGH (ref 70–99)
Glucose-Capillary: 126 mg/dL — ABNORMAL HIGH (ref 70–99)

## 2011-03-02 MED ORDER — HYDROMORPHONE 0.3 MG/ML IV SOLN
INTRAVENOUS | Status: AC
  Filled 2011-03-02: qty 25

## 2011-03-02 MED ORDER — HYDROMORPHONE 0.3 MG/ML IV SOLN
INTRAVENOUS | Status: AC
  Administered 2011-03-02: 3.5 mg via INTRAVENOUS
  Filled 2011-03-02: qty 25

## 2011-03-02 MED ORDER — INSULIN REGULAR HUMAN 100 UNIT/ML IJ SOLN
INTRAVENOUS | Status: AC
  Administered 2011-03-02: 18:00:00 via INTRAVENOUS
  Filled 2011-03-02: qty 2280

## 2011-03-02 NOTE — Progress Notes (Signed)
No fever but had chills yesterday I suspect fluid from wound is coming from the large abscess collection, it is not obviously enteric and CT showed good contrast opacification of anastamosis Feeling better with NGT back on suction Patient examined and I agree with the assessment and plan  Violeta Gelinas, MD, MPH, FACS Pager: [phone]  03/02/2011 8:39 AM

## 2011-03-02 NOTE — Progress Notes (Signed)
8 Days Post-Op  Subjective: RLQ abscess drain placed 2/25 Some better  Objective: Vital signs in last 24 hours: Temp:  [97.8 F (36.6 C)-99.9 F (37.7 C)] 97.8 F (36.6 C) (02/28 0800) Pulse Rate:  [96-113] 96  (02/28 0800) Resp:  [15-22] 17  (02/28 0800) BP: (97-121)/(66-78) 111/78 mmHg (02/28 0800) SpO2:  [94 %-98 %] 95 % (02/28 0400) Weight:  [190 lb 11.2 oz (86.5 kg)] 190 lb 11.2 oz (86.5 kg) (02/28 0500) Last BM Date: 03/01/11  Intake/Output from previous day: 02/27 0701 - 02/28 0700 In: 3169.7 [I.V.:499.7; TPN:2670] Out: 1315 [Urine:1275; Drains:40] Intake/Output this shift: Total I/O In: -  Out: 50 [Urine:50]  PE:  Afeb; VSS Wbc 15.7 (2/27) Output 40 cc yesterday Scant in bag now Site clean and dry; NT   Lab Results:   [facility] 03/01/11 0424 02/28/11 2120 02/28/11 0312  WBC 15.7* -- 16.8*  HGB 10.3* 10.3* --  HCT 29.7* 29.8* --  PLT 330 -- 324   BMET  Basename 03/02/11 0515 03/01/11 0424  NA 131* 132*  K 4.0 3.8  CL 96 98  CO2 26 27  GLUCOSE 117* 128*  BUN 9 10  CREATININE 0.73 0.69  CALCIUM 9.0 8.4   PT/INR No results found for this basename: LABPROT:2,INR:2 in the last 72 hours ABG No results found for this basename: PHART:2,PCO2:2,PO2:2,HCO3:2 in the last 72 hours  Studies/Results: Dg Chest Port 1 View  03/01/2011  *RADIOLOGY REPORT*  Clinical Data: Follow up of left pleural effusion  PORTABLE CHEST - 1 VIEW  Comparison: Portable chest x-ray of 02/27/2011  Findings: There is little change and aeration.  Pleural and parenchymal opacities on the left persist, with little change in right basilar atelectasis.  Right upper extremity PICC line is unchanged in position and an NG tube is present.  Mild cardiomegaly is stable.  IMPRESSION: Little change in pleural and parenchymal opacities on the left and probable right basilar atelectasis.  Original Report Authenticated By: Juline Patch, M.D.    Anti-infectives: Anti-infectives     Start      Dose/Rate Route Frequency Ordered Stop   02/27/11 1400  piperacillin-tazobactam (ZOSYN) IVPB 3.375 g       3.375 g 12.5 mL/hr over 240 Minutes Intravenous 3 times per day 02/27/11 1027     02/26/11 1430   piperacillin-tazobactam (ZOSYN) IVPB 3.375 g  Status:  Discontinued        3.375 g 12.5 mL/hr over 240 Minutes Intravenous 4 times per day 02/26/11 1421 02/27/11 1027   02/15/11 1600   ertapenem (INVANZ) 1 g in sodium chloride 0.9 % 50 mL IVPB  Status:  Discontinued        1 g 100 mL/hr over 30 Minutes Intravenous Every 24 hours 02/14/11 1634 02/15/11 0813   02/14/11 1715   ertapenem (INVANZ) 1 g in sodium chloride 0.9 % 50 mL IVPB        1 g 100 mL/hr over 30 Minutes Intravenous To [facility] 02/14/11 1709 02/14/11 1753   02/14/11 1615   Ampicillin-Sulbactam (UNASYN) 3 g in sodium chloride 0.9 % 100 mL IVPB  Status:  Discontinued        3 g 100 mL/hr over 60 Minutes Intravenous Every 6 hours 02/14/11 1603 02/14/11 1634          Assessment/Plan: s/p Procedure(s) (LRB): EXPLORATORY LAPAROTOMY (N/A)  RLQ drain placed 2/25 Output diminishing No growth so far on Cx Will need re imaging soon to eval collection  Quintus Premo A 03/02/2011

## 2011-03-02 NOTE — Progress Notes (Signed)
    SUBJECTIVE: Mild SOB. Abdominal pain.   BP 120/78  Pulse 102  Temp(Src) 98.1 F (36.7 C) (Oral)  Resp 15  Ht 5\' 11"  (1.803 m)  Wt 190 lb 11.2 oz (86.5 kg)  BMI 26.60 kg/m2  SpO2 95%  Intake/Output Summary (Last 24 hours) at 03/02/11 1610 Last data filed at 03/02/11 0600  Gross per 24 hour  Intake 3024.66 ml  Output   1315 ml  Net 1709.66 ml    PHYSICAL EXAM General: Well developed, well nourished, in no acute distress. Alert and oriented x 3.  Psych:  Good affect, responds appropriately Neck: No JVD. No masses noted.  Lungs: Clear bilaterally with no wheezes or rhonci noted.  Heart: RRR with no murmurs noted. Abdomen: Bowel sounds are present. Soft, non-tender.  Extremities: No lower extremity edema.   LABS: Basic Metabolic Panel:  Basename 03/02/11 0515 03/01/11 0424  NA 131* 132*  K 4.0 3.8  CL 96 98  CO2 26 27  GLUCOSE 117* 128*  BUN 9 10  CREATININE 0.73 0.69  CALCIUM 9.0 8.4  MG 2.1 --  PHOS 4.0 --   CBC:  Basename 03/01/11 0424 02/28/11 2120 02/28/11 0312  WBC 15.7* -- 16.8*  NEUTROABS -- -- 12.8*  HGB 10.3* 10.3* --  HCT 29.7* 29.8* --  MCV 83.2 -- 87.0  PLT 330 -- 324    Current Meds:    . Chlorhexidine Gluconate Cloth  6 each Topical Q0600  . HYDROmorphone PCA 0.3 mg/mL   Intravenous Q4H  . HYDROmorphone PCA 0.3 mg/mL      . HYDROmorphone PCA 0.3 mg/mL      . HYDROmorphone PCA 0.3 mg/mL      . insulin aspart  0-9 Units Subcutaneous Q12H  . metoprolol  10 mg Intravenous Q4H  . mupirocin ointment   Nasal BID  . piperacillin-tazobactam (ZOSYN)  IV  3.375 g Intravenous Q8H  . polyvinyl alcohol  1 drop Right Eye QID  . DISCONTD: insulin aspart  0-9 Units Subcutaneous Q8H     ASSESSMENT AND PLAN:  1. Sinus tachycardia:  Stable. Secondary to underlying issues including pain and anemia. No evidence of PE on chest CTA. LV function normal on echo. No DVT on dopplers. Would continue beta blocker for now. No changes in therap recommended  today.   MCALHANY,CHRISTOPHER  2/28/20137:21 AM
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
## 2011-03-02 NOTE — Progress Notes (Signed)
Patient ID: Craig Garrett, male   DOB: 11-01-1960, 51 y.o.   MRN: [id number]  8 Days Post-Op  Subjective: Pt reports he became nauseated after NGT was clamped and also had worsening abd pain. He max'ed out his PCA due to increase in pain. He did report having a small BM (loose and solid stool) and is occasionally passing some flatus, but feels worse overall.   Objective: Vital signs in last 24 hours: Temp:  [98.1 F (36.7 C)-99.9 F (37.7 C)] 98.1 F (36.7 C) (02/28 0400) Pulse Rate:  [102-113] 102  (02/28 0400) Resp:  [15-22] 15  (02/28 0400) BP: (97-121)/(66-78) 120/78 mmHg (02/28 0400) SpO2:  [94 %-98 %] 95 % (02/28 0400) Weight:  [86.5 kg (190 lb 11.2 oz)] 86.5 kg (190 lb 11.2 oz) (02/28 0500) Last BM Date: 03/01/11  Intake/Output from previous day: 02/27 0701 - 02/28 0700 In: 3044.7 [I.V.:479.7; TPN:2565] Out: 1315 [Urine:1275; Drains:40] Intake/Output this shift:    General appearance: alert, cooperative and moderate distress Resp: clear to auscultation bilaterally, except for decreased BS in bases L>R Cardio: regular rate and rhythm, much less tachycardic GI: wound with 2 open areas draining light brownish/tan fluid without significant odor. The rest of the wound is dk pink granulation.  Very little BS in LLQ, otherwise abd quiet, but not distended  Lab Results: CBC   Basename 03/01/11 0424 02/28/11 2120 02/28/11 0312  WBC 15.7* -- 16.8*  HGB 10.3* 10.3* --  HCT 29.7* 29.8* --  PLT 330 -- 324   BMET  Basename 03/02/11 0515 03/01/11 0424  NA 131* 132*  K 4.0 3.8  CL 96 98  CO2 26 27  GLUCOSE 117* 128*  BUN 9 10  CREATININE 0.73 0.69  CALCIUM 9.0 8.4   PT/INR  Basename 02/27/11 0940  LABPROT 20.0*  INR 1.67*   ABG No results found for this basename: PHART:2,PCO2:2,PO2:2,HCO3:2 in the last 72 hours  Studies/Results: Dg Chest Port 1 View  03/01/2011  *RADIOLOGY REPORT*  Clinical Data: Follow up of left pleural effusion  PORTABLE CHEST - 1 VIEW   Comparison: Portable chest x-ray of 02/27/2011  Findings: There is little change and aeration.  Pleural and parenchymal opacities on the left persist, with little change in right basilar atelectasis.  Right upper extremity PICC line is unchanged in position and an NG tube is present.  Mild cardiomegaly is stable.  IMPRESSION: Little change in pleural and parenchymal opacities on the left and probable right basilar atelectasis.  Original Report Authenticated By: Juline Patch, M.D.    Anti-infectives: Anti-infectives     Start     Dose/Rate Route Frequency Ordered Stop   02/27/11 1400  piperacillin-tazobactam (ZOSYN) IVPB 3.375 g       3.375 g 12.5 mL/hr over 240 Minutes Intravenous 3 times per day 02/27/11 1027     02/26/11 1430   piperacillin-tazobactam (ZOSYN) IVPB 3.375 g  Status:  Discontinued        3.375 g 12.5 mL/hr over 240 Minutes Intravenous 4 times per day 02/26/11 1421 02/27/11 1027   02/15/11 1600   ertapenem (INVANZ) 1 g in sodium chloride 0.9 % 50 mL IVPB  Status:  Discontinued        1 g 100 mL/hr over 30 Minutes Intravenous Every 24 hours 02/14/11 1634 02/15/11 0813   02/14/11 1715   ertapenem (INVANZ) 1 g in sodium chloride 0.9 % 50 mL IVPB        1 g 100 mL/hr over 30 Minutes Intravenous  To [facility] 02/14/11 1709 02/14/11 1753   02/14/11 1615   Ampicillin-Sulbactam (UNASYN) 3 g in sodium chloride 0.9 % 100 mL IVPB  Status:  Discontinued        3 g 100 mL/hr over 60 Minutes Intravenous Every 6 hours 02/14/11 1603 02/14/11 1634          Assessment/Plan: s/p Procedure(s): EXPLORATORY LAPAROTOMY- POD #8 PSBO s/p SB/colon resection/LOA- Wound with 2 open areas with drainage, continue NS dressings Ileus -- Feels better with NGT back on suction, OP dk bilious drainage approx yesterday Right foot/ankle fx- Dr. Carola Frost ABD abscess- Drainage down to 70ml yesterday ID -- Zosyn empiric for abdominal abscess, follow Cx Tachycardia --better after  transfusion, continues metoprolol L pleural effusion -- if worsens may need tap, was stable on yesterday's CXR, will re check in am Hiccups- improved FEN -- Continues TNA  VTE -- Lovenox  Dispo -- Will discuss wound drainage further with MD, ? Repeat contrast CT or UGI w SBFT at this point . Will continue SDU care  LOS: 16 days    Devoiry Corriher,PA-C Pager [phone] General Trauma Pager [phone]  03/02/2011

## 2011-03-03 ENCOUNTER — Encounter (HOSPITAL_COMMUNITY): Payer: Self-pay | Admitting: Radiology

## 2011-03-03 ENCOUNTER — Inpatient Hospital Stay (HOSPITAL_COMMUNITY): Payer: Medicaid Other

## 2011-03-03 LAB — DIFFERENTIAL
Basophils Absolute: 0 10*3/uL (ref 0.0–0.1)
Basophils Relative: 0 % (ref 0–1)
Eosinophils Absolute: 0.4 10*3/uL (ref 0.0–0.7)
Eosinophils Relative: 2 % (ref 0–5)
Lymphocytes Relative: 10 % — ABNORMAL LOW (ref 12–46)
Lymphs Abs: 1.8 10*3/uL (ref 0.7–4.0)
Monocytes Absolute: 1.6 10*3/uL — ABNORMAL HIGH (ref 0.1–1.0)
Monocytes Relative: 9 % (ref 3–12)
Neutro Abs: 14.2 10*3/uL — ABNORMAL HIGH (ref 1.7–7.7)
Neutrophils Relative %: 79 % — ABNORMAL HIGH (ref 43–77)

## 2011-03-03 LAB — GLUCOSE, CAPILLARY
Glucose-Capillary: 126 mg/dL — ABNORMAL HIGH (ref 70–99)
Glucose-Capillary: 90 mg/dL (ref 70–99)

## 2011-03-03 LAB — CBC
Platelets: 385 10*3/uL (ref 150–400)
RDW: 15.2 % (ref 11.5–15.5)
WBC: 18 10*3/uL — ABNORMAL HIGH (ref 4.0–10.5)

## 2011-03-03 MED ORDER — HYDROMORPHONE 0.3 MG/ML IV SOLN
INTRAVENOUS | Status: AC
  Administered 2011-03-03: 4.5 mg via INTRAVENOUS
  Filled 2011-03-03: qty 25

## 2011-03-03 MED ORDER — IOHEXOL 300 MG/ML  SOLN
20.0000 mL | INTRAMUSCULAR | Status: AC
  Administered 2011-03-03 (×2): 20 mL via ORAL

## 2011-03-03 MED ORDER — IOHEXOL 300 MG/ML  SOLN
80.0000 mL | Freq: Once | INTRAMUSCULAR | Status: AC | PRN
  Administered 2011-03-03: 80 mL via INTRAVENOUS

## 2011-03-03 MED ORDER — HYDROMORPHONE 0.3 MG/ML IV SOLN
INTRAVENOUS | Status: AC
  Administered 2011-03-03: 02:00:00
  Filled 2011-03-03: qty 25

## 2011-03-03 MED ORDER — TRACE MINERALS CR-CU-MN-SE-ZN 10-1000-500-60 MCG/ML IV SOLN
INTRAVENOUS | Status: AC
  Administered 2011-03-03: 19:00:00 via INTRAVENOUS
  Filled 2011-03-03: qty 2280

## 2011-03-03 MED ORDER — FAT EMULSION 20 % IV EMUL
250.0000 mL | INTRAVENOUS | Status: AC
  Administered 2011-03-03: 250 mL via INTRAVENOUS
  Filled 2011-03-03: qty 250

## 2011-03-03 MED ORDER — HYDROMORPHONE 0.3 MG/ML IV SOLN
INTRAVENOUS | Status: AC
  Filled 2011-03-03: qty 25

## 2011-03-03 NOTE — Progress Notes (Signed)
    SUBJECTIVE: Still with abdominal pain. No chest pain.   BP 129/83  Pulse 99  Temp(Src) 98.2 F (36.8 C) (Oral)  Resp 17  Ht 5\' 11"  (1.803 m)  Wt 190 lb 11.2 oz (86.5 kg)  BMI 26.60 kg/m2  SpO2 96%  Intake/Output Summary (Last 24 hours) at 03/03/11 1521 Last data filed at 03/03/11 1500  Gross per 24 hour  Intake   3230 ml  Output   2750 ml  Net    480 ml    PHYSICAL EXAM General: Well developed, well nourished, in no acute distress. Alert and oriented x 3.  Psych:  Good affect, responds appropriately Neck: No JVD. No masses noted.  Lungs: Clear bilaterally with no wheezes or rhonci noted.  Heart: Tachy with no murmurs noted. Abdomen: Bowel sounds are present. Distended, tender to palpation Extremities: No lower extremity edema.   LABS: Basic Metabolic Panel:  Basename 03/02/11 0515 03/01/11 0424  NA 131* 132*  K 4.0 3.8  CL 96 98  CO2 26 27  GLUCOSE 117* 128*  BUN 9 10  CREATININE 0.73 0.69  CALCIUM 9.0 8.4  MG 2.1 --  PHOS 4.0 --   CBC:  Basename 03/03/11 0345 03/01/11 0424  WBC 18.0* 15.7*  NEUTROABS 14.2* --  HGB 11.2* 10.3*  HCT 34.0* 29.7*  MCV 85.6 83.2  PLT 385 330    Current Meds:    . Chlorhexidine Gluconate Cloth  6 each Topical Q0600  . HYDROmorphone PCA 0.3 mg/mL   Intravenous Q4H  . HYDROmorphone PCA 0.3 mg/mL      . HYDROmorphone PCA 0.3 mg/mL      . insulin aspart  0-9 Units Subcutaneous Q12H  . iohexol  20 mL Oral Q1 Hr x 2  . metoprolol  10 mg Intravenous Q4H  . mupirocin ointment   Nasal BID  . piperacillin-tazobactam (ZOSYN)  IV  3.375 g Intravenous Q8H  . polyvinyl alcohol  1 drop Right Eye QID     ASSESSMENT AND PLAN:  1. Sinus tachycardia: Stable. Secondary to underlying issues including pain and anemia. No evidence of PE on chest CTA. LV function normal on echo. No DVT on dopplers. Would continue beta blocker for now. No changes in therapy recommended today.  -We will sign off for now. Please call with questions.     Craig Garrett  3/1/20133:21 PM

## 2011-03-03 NOTE — Progress Notes (Signed)
Physical Therapy Treatment Patient Details Name: Craig Garrett MRN: [id number] DOB: Apr 21, 1960 Today's Date: 03/03/2011  PT Assessment/Plan Comments on Treatment Session: Pt continues to be limited by incr HR; dizziness ? due to BPPV--pt reported spinning sensation once in the chair.  Vestibular assessment deferred due to pt beginning to drink contrast for abd CT and just settled into recliner. Pt very cooperative despite abd pain.  PT Plan: Discharge plan needs to be updated;Frequency remains appropriate;Equipment recommendations need to be updated PT Frequency: Min 3X/week Follow Up Recommendations: Home health PT;Supervision - Intermittent (may need vestibular rehab on d/c) Equipment Recommended: None recommended by PT PT Goals  Acute Rehab PT Goals PT Goal: Supine/Side to Sit - Progress: Progressing toward goal PT Goal: Sit to Stand - Progress: Progressing toward goal PT Goal: Stand to Sit - Progress: Progressing toward goal PT Goal: Ambulate - Progress: Progressing toward goal  PT Treatment Precautions/Restrictions  Precautions Precautions: Fall Precaution Comments: Weakness Required Braces or Orthoses: Yes Other Brace/Splint: right ankle air splint  Restrictions Weight Bearing Restrictions: Yes RLE Weight Bearing: Touchdown weight bearing RLE Partial Weight Bearing Percentage or Pounds: TDWB Mobility (including Balance) Bed Mobility Supine to Sit: 5: Supervision;HOB elevated (Comment degrees) Supine to Sit Details (indicate cue type and reason): 30 degrees; supervision for lines/monitors Sitting - Scoot to Edge of Bed: 5: Supervision Transfers Sit to Stand: 3: Mod assist;With upper extremity assist;From bed Sit to Stand Details (indicate cue type and reason): Pt tried using both UEs on RW and required cues for safe use of RW/hand placement; ultimately pt placed one hand on RW with PT stabilizing RW; assist due to LE weakness and to maintain TDWB  Stand to Sit: 4: Min  assist;With armrests;To chair/3-in-1 Stand to Sit Details: cues for sequencingj Ambulation/Gait Ambulation/Gait: Yes Ambulation/Gait Assistance: 4: Min assist Ambulation/Gait Assistance Details (indicate cue type and reason): +2 for lines; pt demonstrated good balance and maintaining TDWB; limited by incr HR and dizziness Ambulation Distance (Feet): 3 Feet Assistive device: Rolling walker Gait Pattern: Step-to pattern    Exercise    End of Session PT - End of Session Equipment Utilized During Treatment: Other (comment) (gait belt not used due to abdominal wound; Rt aircast) Activity Tolerance: Treatment limited secondary to medical complications (Comment) (incr HR; dizziness) Patient left: in chair;with call bell in reach Nurse Communication: Mobility status for transfers;Other (comment) (NG tube left clamped due to pt to begin drinking contrast) General Behavior During Session: [facility] for tasks performed Cognition: [facility] for tasks performed  Nohelani Benning 03/03/2011, 4:14 PM Pager [phone]

## 2011-03-03 NOTE — Progress Notes (Signed)
With the amount of wound drainage and the lack of drainage from his prior percutaneous drain, will repeat his abdominal/pelvic CT and perhaps have another drain placed.  This patient has been seen and I agree with the findings and treatment plan.  Marta Lamas. Gae Bon, MD, FACS [phone] (pager) [phone] (direct pager) Trauma Surgeon

## 2011-03-03 NOTE — Progress Notes (Signed)
9 Days Post-Op  Subjective: Patient feeling a little better; still has some RLQ discomfort; awaiting f/u CT A/P today  Objective: Vital signs in last 24 hours: Temp:  [97.5 F (36.4 C)-98.2 F (36.8 C)] 98.2 F (36.8 C) (03/01 1200) Pulse Rate:  [108-116] 116  (03/01 1300) Resp:  [14-21] 16  (03/01 1300) BP: (117-132)/(77-84) 129/83 mmHg (03/01 1200) SpO2:  [91 %-97 %] 97 % (03/01 1300) Last BM Date: 03/01/11  Intake/Output from previous day: 02/28 0701 - 03/01 0700 In: 2725 [I.V.:460; IV Piggyback:150; TPN:2095] Out: 1950 [Urine:1050; Emesis/NG output:875; Drains:25] Intake/Output this shift: Total I/O In: 785 [I.V.:120; TPN:665] Out: 900 [Urine:400; Emesis/NG output:500]  RLQ drain intact,insertion site ok, mildly tender, output 25 cc's today; drain flushed with 10cc's sterile NS with return of same amount, cx's neg.  Lab Results:   Basename 03/03/11 0345 03/01/11 0424  WBC 18.0* 15.7*  HGB 11.2* 10.3*  HCT 34.0* 29.7*  PLT 385 330   BMET  Basename 03/02/11 0515 03/01/11 0424  NA 131* 132*  K 4.0 3.8  CL 96 98  CO2 26 27  GLUCOSE 117* 128*  BUN 9 10  CREATININE 0.73 0.69  CALCIUM 9.0 8.4   PT/INR No results found for this basename: LABPROT:2,INR:2 in the last 72 hours ABG No results found for this basename: PHART:2,PCO2:2,PO2:2,HCO3:2 in the last 72 hours  Studies/Results: Dg Chest Port 1 View  03/03/2011  *RADIOLOGY REPORT*  Clinical Data: Left pleural effusion, follow up.  PORTABLE CHEST - 1 VIEW  Comparison: 03/01/2011  Findings: Right PICC line tip now appears to be in the mid to lower right atrium.  The study is somewhat reversed AP lordotic in positioning which may account for the apparent change.  Rule left lung airspace disease and right basilar opacity again noted, unchanged.  Small to moderate left pleural effusion, not significantly changed.  IMPRESSION: No significant interval change in bilateral airspace disease and left effusion.  Original Report  Authenticated By: Cyndie Chime, M.D.   Results for orders placed during the hospital encounter of 02/14/11  SURGICAL PCR SCREEN     Status: Abnormal   Collection Time   02/22/11 10:28 AM      Component Value Range Status Comment   MRSA, PCR POSITIVE (*) NEGATIVE  Final    Staphylococcus aureus POSITIVE (*) NEGATIVE  Final   URINE CULTURE     Status: Normal   Collection Time   02/24/11  9:06 AM      Component Value Range Status Comment   Specimen Description URINE, CLEAN CATCH   Final    Special Requests NONE   Final    Culture  Setup Time [id number]   Final    Colony Count NO GROWTH   Final    Culture NO GROWTH   Final    Report Status 02/25/2011 FINAL   Final   CULTURE, BLOOD (ROUTINE X 2)     Status: Normal (Preliminary result)   Collection Time   02/26/11 10:30 AM      Component Value Range Status Comment   Specimen Description BLOOD LEFT HAND   Final    Special Requests BOTTLES DRAWN AEROBIC ONLY 5CC   Final    Culture  Setup Time [id number]   Final    Culture     Final    Value:        BLOOD CULTURE RECEIVED NO GROWTH TO DATE CULTURE WILL BE HELD FOR 5 DAYS BEFORE ISSUING A FINAL NEGATIVE REPORT  Report Status PENDING   Incomplete   CULTURE, BLOOD (ROUTINE X 2)     Status: Normal (Preliminary result)   Collection Time   02/26/11 10:50 AM      Component Value Range Status Comment   Specimen Description BLOOD LEFT ARM   Final    Special Requests BOTTLES DRAWN AEROBIC ONLY 5CC   Final    Culture  Setup Time [id number]   Final    Culture     Final    Value:        BLOOD CULTURE RECEIVED NO GROWTH TO DATE CULTURE WILL BE HELD FOR 5 DAYS BEFORE ISSUING A FINAL NEGATIVE REPORT   Report Status PENDING   Incomplete   CULTURE, ROUTINE-ABSCESS     Status: Normal   Collection Time   02/27/11  2:21 PM      Component Value Range Status Comment   Specimen Description ABSCESS PELVIS   Final    Special Requests NONE   Final    Gram Stain     Final    Value: RARE WBC PRESENT,  PREDOMINANTLY PMN     NO SQUAMOUS EPITHELIAL CELLS SEEN     NO ORGANISMS SEEN   Culture NO GROWTH 3 DAYS   Final    Report Status 03/03/2011 FINAL   Final   CULTURE, SPUTUM-ASSESSMENT     Status: Normal   Collection Time   02/27/11 10:06 PM      Component Value Range Status Comment   Specimen Description SPUTUM   Final    Special Requests Normal   Final    Sputum evaluation     Final    Value: MICROSCOPIC FINDINGS SUGGEST THAT THIS SPECIMEN IS NOT REPRESENTATIVE OF LOWER RESPIRATORY SECRETIONS. PLEASE RECOLLECT.     Peter Minium 0014 02.26.13   Report Status 02/28/2011 FINAL   Final      Anti-infectives: Anti-infectives     Start     Dose/Rate Route Frequency Ordered Stop   02/27/11 1400  piperacillin-tazobactam (ZOSYN) IVPB 3.375 g       3.375 g 12.5 mL/hr over 240 Minutes Intravenous 3 times per day 02/27/11 1027     02/26/11 1430   piperacillin-tazobactam (ZOSYN) IVPB 3.375 g  Status:  Discontinued        3.375 g 12.5 mL/hr over 240 Minutes Intravenous 4 times per day 02/26/11 1421 02/27/11 1027   02/15/11 1600   ertapenem (INVANZ) 1 g in sodium chloride 0.9 % 50 mL IVPB  Status:  Discontinued        1 g 100 mL/hr over 30 Minutes Intravenous Every 24 hours 02/14/11 1634 02/15/11 0813   02/14/11 1715   ertapenem (INVANZ) 1 g in sodium chloride 0.9 % 50 mL IVPB        1 g 100 mL/hr over 30 Minutes Intravenous To [facility] 02/14/11 1709 02/14/11 1753   02/14/11 1615   Ampicillin-Sulbactam (UNASYN) 3 g in sodium chloride 0.9 % 100 mL IVPB  Status:  Discontinued        3 g 100 mL/hr over 60 Minutes Intravenous Every 6 hours 02/14/11 1603 02/14/11 1634          Assessment/Plan: s/p RLQ fluid collection drainage 2/25; check f/u CT today to assess adequacy of drainage and evaluate for new collections   LOS: 17 days    Jayleen Afonso,D [facility] 03/03/2011
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
## 2011-03-04 ENCOUNTER — Inpatient Hospital Stay (HOSPITAL_COMMUNITY): Payer: Medicaid Other

## 2011-03-04 LAB — CBC
MCH: 29.2 pg (ref 26.0–34.0)
MCHC: 34.2 g/dL (ref 30.0–36.0)
Platelets: 412 10*3/uL — ABNORMAL HIGH (ref 150–400)

## 2011-03-04 LAB — CULTURE, BLOOD (ROUTINE X 2)
Culture  Setup Time: 201302241641
Culture: NO GROWTH

## 2011-03-04 LAB — BASIC METABOLIC PANEL
Calcium: 9.1 mg/dL (ref 8.4–10.5)
GFR calc non Af Amer: >90 mL/min (ref >90)
Sodium: 132 meq/L — ABNORMAL LOW (ref 135–145)

## 2011-03-04 MED ORDER — HYDROMORPHONE 0.3 MG/ML IV SOLN
INTRAVENOUS | Status: AC
  Filled 2011-03-04: qty 25

## 2011-03-04 MED ORDER — INSULIN REGULAR HUMAN 100 UNIT/ML IJ SOLN
INTRAVENOUS | Status: AC
  Administered 2011-03-04: 18:00:00 via INTRAVENOUS
  Filled 2011-03-04: qty 2000

## 2011-03-04 MED ORDER — CHLORHEXIDINE GLUCONATE CLOTH 2 % EX PADS
6.0000 | MEDICATED_PAD | Freq: Every morning | CUTANEOUS | Status: DC
  Administered 2011-03-04 – 2011-03-06 (×3): 6 via TOPICAL
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
## 2011-03-05 LAB — CBC
HCT: 36.2 % — ABNORMAL LOW (ref 39.0–52.0)
MCHC: 34.5 g/dL (ref 30.0–36.0)
MCV: 85 fL (ref 78.0–100.0)
RDW: 14.7 % (ref 11.5–15.5)
WBC: 23.4 10*3/uL — ABNORMAL HIGH (ref 4.0–10.5)

## 2011-03-05 LAB — GLUCOSE, CAPILLARY

## 2011-03-05 MED ORDER — ONDANSETRON HCL 4 MG/2ML IJ SOLN
4.0000 mg | Freq: Four times a day (QID) | INTRAMUSCULAR | Status: DC | PRN
  Administered 2011-03-05 – 2011-03-14 (×15): 4 mg via INTRAVENOUS
  Filled 2011-03-05 (×16): qty 2

## 2011-03-05 MED ORDER — INSULIN REGULAR HUMAN 100 UNIT/ML IJ SOLN
INTRAVENOUS | Status: AC
  Administered 2011-03-05: 18:00:00 via INTRAVENOUS
  Filled 2011-03-05: qty 2000

## 2011-03-05 MED ORDER — HYDROMORPHONE 0.3 MG/ML IV SOLN
INTRAVENOUS | Status: AC
  Filled 2011-03-05: qty 25

## 2011-03-05 MED ORDER — ONDANSETRON HCL 4 MG/2ML IJ SOLN
INTRAMUSCULAR | Status: AC
  Administered 2011-03-07: 4 mg via INTRAVENOUS
  Filled 2011-03-05: qty 2

## 2011-03-05 NOTE — Progress Notes (Signed)
Drain repositioned.  Draining more Continue abx Ileus - continue NGT  Wilmon Arms. Corliss Skains, MD, [facility]  03/05/2011 11:52 AM
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
## 2011-03-06 LAB — COMPREHENSIVE METABOLIC PANEL
AST: 94 U/L — ABNORMAL HIGH (ref 0–37)
Alkaline Phosphatase: 644 U/L — ABNORMAL HIGH (ref 39–117)
CO2: 30 mEq/L (ref 19–32)
Chloride: 96 mEq/L (ref 96–112)
Creatinine, Ser: 0.84 mg/dL (ref 0.50–1.35)
GFR calc non Af Amer: >90 mL/min (ref >90)
Total Bilirubin: 1.4 mg/dL — ABNORMAL HIGH (ref 0.3–1.2)

## 2011-03-06 LAB — GLUCOSE, CAPILLARY

## 2011-03-06 LAB — CBC
HCT: 37 % — ABNORMAL LOW (ref 39.0–52.0)
MCH: 29.1 pg (ref 26.0–34.0)
MCHC: 33.8 g/dL (ref 30.0–36.0)
MCV: 86 fL (ref 78.0–100.0)
RDW: 14.9 % (ref 11.5–15.5)

## 2011-03-06 LAB — DIFFERENTIAL
Basophils Absolute: 0.2 10*3/uL — ABNORMAL HIGH (ref 0.0–0.1)
Eosinophils Absolute: 0.2 10*3/uL (ref 0.0–0.7)
Lymphs Abs: 2.6 10*3/uL (ref 0.7–4.0)
Monocytes Absolute: 2.6 10*3/uL — ABNORMAL HIGH (ref 0.1–1.0)
Neutro Abs: 18.3 10*3/uL — ABNORMAL HIGH (ref 1.7–7.7)

## 2011-03-06 LAB — PHOSPHORUS: Phosphorus: 4.3 mg/dL (ref 2.3–4.6)

## 2011-03-06 LAB — TRIGLYCERIDES: Triglycerides: 85 mg/dL (ref <150)

## 2011-03-06 MED ORDER — TRACE MINERALS CR-CU-MN-SE-ZN 10-1000-500-60 MCG/ML IV SOLN
INTRAVENOUS | Status: AC
  Administered 2011-03-06: 18:00:00 via INTRAVENOUS
  Filled 2011-03-06: qty 2000

## 2011-03-06 MED ORDER — VANCOMYCIN HCL 1000 MG IV SOLR
1250.0000 mg | Freq: Two times a day (BID) | INTRAVENOUS | Status: DC
  Administered 2011-03-06 – 2011-03-14 (×17): 1250 mg via INTRAVENOUS
  Filled 2011-03-06 (×18): qty 1250

## 2011-03-06 MED ORDER — FAT EMULSION 20 % IV EMUL
250.0000 mL | INTRAVENOUS | Status: AC
  Administered 2011-03-06: 250 mL via INTRAVENOUS
  Filled 2011-03-06: qty 250

## 2011-03-06 MED ORDER — HYDROMORPHONE 0.3 MG/ML IV SOLN
INTRAVENOUS | Status: AC
  Filled 2011-03-06: qty 25

## 2011-03-06 MED ORDER — HYDROMORPHONE 0.3 MG/ML IV SOLN
INTRAVENOUS | Status: AC
  Administered 2011-03-06: 17:00:00
  Filled 2011-03-06: qty 25
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
## 2011-03-06 NOTE — Progress Notes (Signed)
12 Days Post-Op  Subjective: Feeling some better today, tolerating ice chips. NGT intact  Objective: Vital signs in last 24 hours: Temp:  [98 F (36.7 C)-98.4 F (36.9 C)] 98 F (36.7 C) (03/04 0705) Pulse Rate:  [96-105] 105  (03/04 0907) Resp:  [13-15] 14  (03/04 0907) BP: (108-123)/(72-87) 109/80 mmHg (03/04 0907) SpO2:  [95 %-97 %] 96 % (03/04 0907) FiO2 (%):  [21 %] 21 % (03/04 0400) Weight:  [174 lb 2.6 oz (79 kg)] 174 lb 2.6 oz (79 kg) (03/04 0400) Last BM Date: 03/01/11  Intake/Output from previous day: 03/03 0701 - 03/04 0700 In: 1680.9 [I.V.:745.9; NG/GT:60; TPN:855] Out: 2392 [Urine:1051; Emesis/NG output:1300; Drains:40; Stool:1] Intake/Output this shift: Total I/O In: 1105 [I.V.:105; NG/GT:30; IV Piggyback:400; TPN:570] Out: 1010 [Urine:300; Emesis/NG output:700; Drains:10]  PE:  RLQ drain intact - just emptied - + air noted in bag.  Has had 60ml, 40 ml last two days, currently recorded 10 ml from earlier today.  Abdomen with clean dressings. Soft, tender, few bowel sounds.   Lab Results:   Basename 03/06/11 0528 03/05/11 0415  WBC 23.9* 23.4*  HGB 12.5* 12.5*  HCT 37.0* 36.2*  PLT 500* 452*   BMET  Basename 03/06/11 0528 03/04/11 0520  NA 136 132*  K 3.6 3.9  CL 96 96  CO2 30 27  GLUCOSE 136* 106*  BUN 16 10  CREATININE 0.84 0.64  CALCIUM 9.8 9.1   PT/INR No results found for this basename: LABPROT:2,INR:2 in the last 72 hours ABG No results found for this basename: PHART:2,PCO2:2,PO2:2,HCO3:2 in the last 72 hours  Studies/Results: No results found.  Anti-infectives: Anti-infectives     Start     Dose/Rate Route Frequency Ordered Stop   03/06/11 1100   vancomycin (VANCOCIN) 1,250 mg in sodium chloride 0.9 % 250 mL IVPB        1,250 mg 166.7 mL/hr over 90 Minutes Intravenous Every 12 hours 03/06/11 1026     02/27/11 1400   piperacillin-tazobactam (ZOSYN) IVPB 3.375 g        3.375 g 12.5 mL/hr over 240 Minutes Intravenous 3 times per day  02/27/11 1027     02/26/11 1430   piperacillin-tazobactam (ZOSYN) IVPB 3.375 g  Status:  Discontinued        3.375 g 12.5 mL/hr over 240 Minutes Intravenous 4 times per day 02/26/11 1421 02/27/11 1027   02/15/11 1600   ertapenem (INVANZ) 1 g in sodium chloride 0.9 % 50 mL IVPB  Status:  Discontinued        1 g 100 mL/hr over 30 Minutes Intravenous Every 24 hours 02/14/11 1634 02/15/11 0813   02/14/11 1715   ertapenem (INVANZ) 1 g in sodium chloride 0.9 % 50 mL IVPB        1 g 100 mL/hr over 30 Minutes Intravenous [facility] 02/14/11 1709 02/14/11 1753   02/14/11 1615   Ampicillin-Sulbactam (UNASYN) 3 g in sodium chloride 0.9 % 100 mL IVPB  Status:  Discontinued        3 g 100 mL/hr over 60 Minutes Intravenous Every 6 hours 02/14/11 1603 02/14/11 1634          Assessment/Plan:  RLQ abscess s/p drain in IR with repositioning,  Output declining last few days. Air in bag - monitor.  IR to continue to follow.   LOS: 20 days    Brock Larmon D 03/06/2011

## 2011-03-06 NOTE — Progress Notes (Signed)
Physical Therapy Treatment Patient Details Name: Craig Garrett MRN: [id number] DOB: 11-21-1960 Today's Date: 03/06/2011  PT Assessment/Plan  Comments on Treatment Session: RN reports pt may have NG tube pulled at 1300 today.  If pulled, could proceed with BPPV testing and treatment. Will follow-up this pm. PT Plan: Discharge plan remains appropriate;Frequency remains appropriate PT Frequency: Min 3X/week Follow Up Recommendations: Home health PT;Supervision - Intermittent Equipment Recommended: None recommended by PT PT Goals  Acute Rehab PT Goals PT Goal: Supine/Side to Sit - Progress: Progressing toward goal PT Goal: Sit to Stand - Progress: Progressing toward goal PT Goal: Stand to Sit - Progress: Progressing toward goal PT Goal: Ambulate - Progress: Progressing toward goal  PT Treatment Precautions/Restrictions  Precautions Precautions: Fall Precaution Comments: + vertigo symptoms Required Braces or Orthoses: Yes Other Brace/Splint: right ankle air splint  Restrictions Weight Bearing Restrictions: Yes (lt wt bear on R foot) RLE Weight Bearing: Touchdown weight bearing RLE Partial Weight Bearing Percentage or Pounds: TDWB Mobility (including Balance) Bed Mobility Supine to Sit: 5: Supervision;HOB elevated (Comment degrees) Supine to Sit Details (indicate cue type and reason): Pt prefers raising HOB to 50 and then come to sit EOB due to incr vertigo if rolls onto his side to come to sitting; supervision due to lines Sitting - Scoot to Edge of Bed: 5: Supervision Transfers Sit to Stand: 4: Min assist;With upper extremity assist;From bed Sit to Stand Details (indicate cue type and reason): pt putting > TDWB on RLE despite instructional cues Stand to Sit: 4: Min assist;With upper extremity assist;With armrests;To chair/3-in-1 Stand to Sit Details: cues for sequencing Ambulation/Gait Ambulation/Gait Assistance: 4: Min assist Ambulation/Gait Assistance Details (indicate cue type  and reason): maintaining TDWB during gait; turns impulsively with decr attention to lines/IV Ambulation Distance (Feet): 5 Feet Assistive device: Rolling walker Gait Pattern: Step-to pattern  Static Sitting Balance Static Sitting - Balance Support: No upper extremity supported;Feet supported Static Sitting - Level of Assistance: 7: Independent Static Sitting - Comment/# of Minutes: reported spinning sensation upon sitting EOB (feels he's rotating to the Lt); 6/10 severity and lasted ~2 minutes; instructed in gaze stabilization to reduce symptoms--pt preferred closing his eyes (discussed rationale for gaze stabilization) Exercise    End of Session PT - End of Session Equipment Utilized During Treatment: Other (comment) (gait belt not used due to abdominal wound; Rt aircast) Activity Tolerance: Other (comment) (limited by dizziness; pt assisted to w/c to transfer to 5100) Patient left: in chair;Other (comment) (RN preparing to transfer pt) Nurse Communication: Mobility status for transfers;Mobility status for ambulation;Other (comment) (vertigo and cuing pt to close eyes during transport) General Behavior During Session: [facility] for tasks performed Cognition: [facility] for tasks performed  Craig Garrett 03/06/2011, 1:15 PM Pager [phone]
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
## 2011-03-07 LAB — CBC
HCT: 34.8 % — ABNORMAL LOW (ref 39.0–52.0)
Platelets: 446 10*3/uL — ABNORMAL HIGH (ref 150–400)
RDW: 14.8 % (ref 11.5–15.5)
WBC: 15.1 10*3/uL — ABNORMAL HIGH (ref 4.0–10.5)

## 2011-03-07 LAB — GLUCOSE, CAPILLARY: Glucose-Capillary: 129 mg/dL — ABNORMAL HIGH (ref 70–99)

## 2011-03-07 MED ORDER — HYDROMORPHONE 0.3 MG/ML IV SOLN
INTRAVENOUS | Status: AC
  Filled 2011-03-07: qty 25

## 2011-03-07 MED ORDER — INSULIN REGULAR HUMAN 100 UNIT/ML IJ SOLN
INTRAVENOUS | Status: AC
  Administered 2011-03-07: 18:00:00 via INTRAVENOUS
  Filled 2011-03-07: qty 2280

## 2011-03-07 NOTE — Progress Notes (Signed)
13 Days Post-Op  Subjective: Patient feeling"so-so"; no BM, + flatus, intermittent nausea  Objective: Vital signs in last 24 hours: Temp:  [97.4 F (36.3 C)-98.8 F (37.1 C)] 97.7 F (36.5 C) (03/05 1008) Pulse Rate:  [66-108] 92  (03/05 1008) Resp:  [12-18] 15  (03/05 1132) BP: (99-129)/(60-77) 108/64 mmHg (03/05 1008) SpO2:  [92 %-99 %] 94 % (03/05 1132) Last BM Date: 03/05/11  Intake/Output from previous day: 03/04 0701 - 03/05 0700 In: 4239.2 [P.O.:60; I.V.:533; NG/GT:50; IV Piggyback:1050; TPN:2536.2] Out: 2405 [Urine:1125; Emesis/NG output:1250; Drains:30] Intake/Output this shift:    RLQ drain intact, output about 30 cc's today serous fluid, cx's neg.  Lab Results:   Basename 03/07/11 0601 03/06/11 0528  WBC 15.1* 23.9*  HGB 11.4* 12.5*  HCT 34.8* 37.0*  PLT 446* 500*   BMET  Basename 03/06/11 0528  NA 136  K 3.6  CL 96  CO2 30  GLUCOSE 136*  BUN 16  CREATININE 0.84  CALCIUM 9.8   PT/INR No results found for this basename: LABPROT:2,INR:2 in the last 72 hours ABG No results found for this basename: PHART:2,PCO2:2,PO2:2,HCO3:2 in the last 72 hours  Studies/Results: No results found. Results for orders placed during the hospital encounter of 02/14/11  SURGICAL PCR SCREEN     Status: Abnormal   Collection Time   02/22/11 10:28 AM      Component Value Range Status Comment   MRSA, PCR POSITIVE (*) NEGATIVE  Final    Staphylococcus aureus POSITIVE (*) NEGATIVE  Final   URINE CULTURE     Status: Normal   Collection Time   02/24/11  9:06 AM      Component Value Range Status Comment   Specimen Description URINE, CLEAN CATCH   Final    Special Requests NONE   Final    Culture  Setup Time [id number]   Final    Colony Count NO GROWTH   Final    Culture NO GROWTH   Final    Report Status 02/25/2011 FINAL   Final   CULTURE, BLOOD (ROUTINE X 2)     Status: Normal   Collection Time   02/26/11 10:30 AM      Component Value Range Status Comment   Specimen  Description BLOOD LEFT HAND   Final    Special Requests BOTTLES DRAWN AEROBIC ONLY 5CC   Final    Culture  Setup Time [id number]   Final    Culture NO GROWTH 5 DAYS   Final    Report Status 03/04/2011 FINAL   Final   CULTURE, BLOOD (ROUTINE X 2)     Status: Normal   Collection Time   02/26/11 10:50 AM      Component Value Range Status Comment   Specimen Description BLOOD LEFT ARM   Final    Special Requests BOTTLES DRAWN AEROBIC ONLY 5CC   Final    Culture  Setup Time [id number]   Final    Culture NO GROWTH 5 DAYS   Final    Report Status 03/04/2011 FINAL   Final   CULTURE, ROUTINE-ABSCESS     Status: Normal   Collection Time   02/27/11  2:21 PM      Component Value Range Status Comment   Specimen Description ABSCESS PELVIS   Final    Special Requests NONE   Final    Gram Stain     Final    Value: RARE WBC PRESENT, PREDOMINANTLY PMN     NO SQUAMOUS EPITHELIAL CELLS SEEN  NO ORGANISMS SEEN   Culture NO GROWTH 3 DAYS   Final    Report Status 03/03/2011 FINAL   Final   CULTURE, SPUTUM-ASSESSMENT     Status: Normal   Collection Time   02/27/11 10:06 PM      Component Value Range Status Comment   Specimen Description SPUTUM   Final    Special Requests Normal   Final    Sputum evaluation     Final    Value: MICROSCOPIC FINDINGS SUGGEST THAT THIS SPECIMEN IS NOT REPRESENTATIVE OF LOWER RESPIRATORY SECRETIONS. PLEASE RECOLLECT.     Peter Minium 0014 02.26.13   Report Status 02/28/2011 FINAL   Final     Anti-infectives: Anti-infectives     Start     Dose/Rate Route Frequency Ordered Stop   03/06/11 1100   vancomycin (VANCOCIN) 1,250 mg in sodium chloride 0.9 % 250 mL IVPB        1,250 mg 166.7 mL/hr over 90 Minutes Intravenous Every 12 hours 03/06/11 1026     02/27/11 1400  piperacillin-tazobactam (ZOSYN) IVPB 3.375 g       3.375 g 12.5 mL/hr over 240 Minutes Intravenous 3 times per day 02/27/11 1027     02/26/11 1430   piperacillin-tazobactam (ZOSYN) IVPB 3.375 g   Status:  Discontinued        3.375 g 12.5 mL/hr over 240 Minutes Intravenous 4 times per day 02/26/11 1421 02/27/11 1027   02/15/11 1600   ertapenem (INVANZ) 1 g in sodium chloride 0.9 % 50 mL IVPB  Status:  Discontinued        1 g 100 mL/hr over 30 Minutes Intravenous Every 24 hours 02/14/11 1634 02/15/11 0813   02/14/11 1715   ertapenem (INVANZ) 1 g in sodium chloride 0.9 % 50 mL IVPB        1 g 100 mL/hr over 30 Minutes Intravenous To [facility] 02/14/11 1709 02/14/11 1753   02/14/11 1615   Ampicillin-Sulbactam (UNASYN) 3 g in sodium chloride 0.9 % 100 mL IVPB  Status:  Discontinued        3 g 100 mL/hr over 60 Minutes Intravenous Every 6 hours 02/14/11 1603 02/14/11 1634          Assessment/Plan: s/p RLQ abscess drainage 2/25, recent repositioning; cont current tx as per CCS.    LOS: 21 days    Kashmere Daywalt,D [facility] 03/07/2011

## 2011-03-07 NOTE — Progress Notes (Signed)
Nutrition Follow-up  Diet Order:  NPO.    Patient is receiving TPN with Clinimix E 5/20 @ 95 ml/hr.  Lipids (20% IVFE @ 10 ml/hr), multivitamins, and trace elements are provided 3 times weekly (MWF) due to national backorder.  Provides 2212 kcal and 114 grams protein daily (based on weekly average).  Meets 100% minimum estimated kcal and 100% minimum estimated protein needs.  Current TPN regimen provides 456 gm dextrose daily with a GIR of 4.0 mg/kg/min.  Macronutrient distribution is 21% protein, 70% dextrose, and 9% fat calories.  Patient appears to be tolerating this dextrose load with the addition of 15 units insulin.  Meds: Scheduled Meds:   . HYDROmorphone PCA 0.3 mg/mL   Intravenous Q4H  . HYDROmorphone PCA 0.3 mg/mL      . HYDROmorphone PCA 0.3 mg/mL      . insulin aspart  0-9 Units Subcutaneous Q12H  . metoprolol  10 mg Intravenous Q4H  . piperacillin-tazobactam (ZOSYN)  IV  3.375 g Intravenous Q8H  . polyvinyl alcohol  1 drop Right Eye QID  . vancomycin  1,250 mg Intravenous Q12H   Continuous Infusions:   . 0.45 % NaCl with KCl 20 mEq / L 20 mL (03/07/11 0526)  . fat emulsion 250 mL (03/06/11 1738)  . TPN (CLINIMIX) +/- additives 95 mL/hr at 03/05/11 1806  . TPN (CLINIMIX) +/- additives 95 mL/hr at 03/06/11 1737  . TPN (CLINIMIX) +/- additives     PRN Meds:.chlorproMAZINE (THORAZINE) IV, diphenhydrAMINE, diphenhydrAMINE, metoprolol, naloxone, ondansetron, phenol, sodium chloride, sodium chloride, sodium chloride  Labs:  CMP     Component Value Date/Time   NA 136 03/06/2011 0528   K 3.6 03/06/2011 0528   CL 96 03/06/2011 0528   CO2 30 03/06/2011 0528   GLUCOSE 136* 03/06/2011 0528   BUN 16 03/06/2011 0528   CREATININE 0.84 03/06/2011 0528   CALCIUM 9.8 03/06/2011 0528   PROT 7.9 03/06/2011 0528   ALBUMIN 2.5* 03/06/2011 0528   AST 94* 03/06/2011 0528   ALT 178* 03/06/2011 0528   ALKPHOS 644* 03/06/2011 0528   BILITOT 1.4* 03/06/2011 0528   GFRNONAA >90 03/06/2011 0528   GFRAA >90 03/06/2011  0528  Phosphorus 4.3 (3/4), 4.0 (2/28), 3.6 (2/25) Magnesium 2.1 (3/4), 2.1 (2/28), 1.9 (2/25) Prealbumin 27.9 (3/4), 5.0 (2/25), 19.0 (2/20), 15.5 (2/17) Triglycerides 85 (3/4), 83 (2/25), 159 (2/20)  CBG (last 3)   Basename 03/07/11 0517 03/06/11 1800 03/06/11 0555  GLUCAP 129* 139* 146*      Intake/Output Summary (Last 24 hours) at 03/07/11 1256 Last data filed at 03/07/11 1234  Gross per 24 hour  Intake 2614.16 ml  Output   1670 ml  Net 944.16 ml    Weight Status:  79 kg (03/06/11), down from ~85 kg (7%) on admission.  BMI 24.3, consistent with normal weight.    Re-estimated needs:  2200-2400 kcals, 110-120 gm protein (unchanged)  Nutrition Dx:  Inadequate oral intake--ongoing.  Goal:  TPN to meet >90% estimated protein needs, maximize energy provision as able during national lipid backorder--currently met.    Intervention:  Continue TPN per Pharmacy.  Monitor:  TPN tolerance, glycemic control, labs, weight trend, diet advancement.   Otto Herb Pager #:  [phone]
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
## 2011-03-07 NOTE — Progress Notes (Signed)
Feeling better now, no pain + flatus WBC better on Vanc Patient examined and I agree with the assessment and plan  Violeta Gelinas, MD, MPH, FACS Pager: [phone]  03/07/2011 9:43 AM

## 2011-03-07 NOTE — Progress Notes (Signed)
Patient ID: Craig Garrett, male   DOB: [birth year], 51 y.o.   MRN: [id number]   LOS: 21 days   Subjective: Had significant pain last night but better now. +flatus, no BM. +nausea, no emesis.  Objective: Vital signs in last 24 hours: Temp:  [97.4 F (36.3 C)-98.8 F (37.1 C)] 98.1 F (36.7 C) (03/05 0518) Pulse Rate:  [66-108] 89  (03/05 0518) Resp:  [12-18] 12  (03/05 0641) BP: (99-129)/(60-80) 113/67 mmHg (03/05 0518) SpO2:  [94 %-99 %] 94 % (03/05 0641) Last BM Date: 03/05/11   NGT: 1246ml/24h  JP: 28ml/24h   Lab Results:  CBC  Basename 03/07/11 0601 03/06/11 0528  WBC 15.1* 23.9*  HGB 11.4* 12.5*  HCT 34.8* 37.0*  PLT 446* 500*   CBG (last 3)   Basename 03/07/11 0517 03/06/11 1800 03/06/11 0555  GLUCAP 129* 139* 146*     General appearance: alert and no distress Resp: clear to auscultation bilaterally Cardio: regular rate and rhythm GI: Soft, diminished BS. Wound granulating well, still able to express pus from same 2 areas.  Assessment/Plan: PSBO s/p SB/colon resection/LOA- Wound with 2 open areas with drainage - tracks up from abscess as seen on CT, continue NS dressings  Ileus -- persists. Continue NGT today. Right foot/ankle fx- Dr. Carola Frost  ABD abscess -- drain repositioned after F/U CT  ID -- Zosyn D#9, Vanc D#2, WBC down dramatically. Tachycardia -- improved on metoprolol  L pleural effusion -- stable on previous F/U CXR  FEN -- Continue TNA  VTE -- Lovenox  Dispo -- Ileus    [name], PA-C Pager: [phone] General Trauma PA Pager: [phone]   03/07/2011

## 2011-03-08 LAB — CBC
MCH: 28.4 pg (ref 26.0–34.0)
MCV: 87.1 fL (ref 78.0–100.0)
Platelets: 415 10*3/uL — ABNORMAL HIGH (ref 150–400)
RDW: 14.8 % (ref 11.5–15.5)
WBC: 11 10*3/uL — ABNORMAL HIGH (ref 4.0–10.5)

## 2011-03-08 LAB — GLUCOSE, CAPILLARY: Glucose-Capillary: 99 mg/dL (ref 70–99)

## 2011-03-08 MED ORDER — TRACE MINERALS CR-CU-MN-SE-ZN 10-1000-500-60 MCG/ML IV SOLN
INTRAVENOUS | Status: AC
  Administered 2011-03-08: 17:00:00 via INTRAVENOUS
  Filled 2011-03-08: qty 2280

## 2011-03-08 MED ORDER — METOPROLOL TARTRATE 1 MG/ML IV SOLN
5.0000 mg | INTRAVENOUS | Status: DC
  Administered 2011-03-08 – 2011-03-10 (×12): 5 mg via INTRAVENOUS
  Filled 2011-03-08 (×18): qty 5

## 2011-03-08 MED ORDER — HYDROMORPHONE 0.3 MG/ML IV SOLN
INTRAVENOUS | Status: AC
  Filled 2011-03-08: qty 25

## 2011-03-08 MED ORDER — FAT EMULSION 20 % IV EMUL
250.0000 mL | INTRAVENOUS | Status: AC
  Administered 2011-03-08: 250 mL via INTRAVENOUS
  Filled 2011-03-08: qty 250

## 2011-03-08 MED ORDER — HYDROMORPHONE 0.3 MG/ML IV SOLN
INTRAVENOUS | Status: AC
  Administered 2011-03-08: 1.56 mg via INTRAVENOUS
  Filled 2011-03-08: qty 25

## 2011-03-08 NOTE — Progress Notes (Signed)
Patient ID: Craig Garrett, male   DOB: [birth year], 51 y.o.   MRN: [id number]   LOS: 22 days   Subjective: Feels about the same. Some nausea, no emesis. Small amount of flatus.  Objective: Vital signs in last 24 hours: Temp:  [97.7 F (36.5 C)-98.9 F (37.2 C)] 98.5 F (36.9 C) (03/06 0507) Pulse Rate:  [88-97] 96  (03/06 0507) Resp:  [14-18] 14  (03/06 0507) BP: (93-118)/(64-75) 118/71 mmHg (03/06 0507) SpO2:  [92 %-99 %] 95 % (03/06 0507) Last BM Date: 03/05/11   NGT: 568ml/24h JP: 47ml/24h   Lab Results:  CBC  Basename 03/08/11 0455 03/07/11 0601  WBC 11.0* 15.1*  HGB 10.6* 11.4*  HCT 32.5* 34.8*  PLT 415* 446*    General appearance: alert and no distress Resp: clear to auscultation bilaterally Cardio: regular rate and rhythm GI: Soft, wound granulating well. Still able to express some fluid from wound but much less frankly purulent. No odor. Minimal BS.  Assessment/Plan: PSBO s/p SB/colon resection/LOA- Wound with 2 open areas with drainage - tracks up from abscess as seen on CT, continue NS dressings  Ileus -- persists. NGT OP down but still ~576ml OVN so continue NGT today.  Right foot/ankle fx- Dr. Carola Frost  ABD abscess -- Drain could likely come out but inclined to leave it as long as wound is still draining. ID -- Zosyn D#10, Vanc D#3, WBC down again today. Afebrile. Tachycardia -- Will see if we can wean metoprolol. L pleural effusion -- stable on previous F/U CXR  FEN -- Continue TNA  VTE -- Lovenox  Dispo -- Ileus    [name], PA-C Pager: [phone] General Trauma PA Pager: [phone]   03/08/2011

## 2011-03-08 NOTE — Progress Notes (Signed)
Clinical Social Worker completed the psychosocial assessment which can be found in the shadow chart.  CSW met with the patient to offer emotional support.  Patient expressed frustrations about his long hospital stay and not being able to eat.  Patient is anxious to start eating and return home.  Per patient, he is currently living with his daughter who he helps out by babysitting his grandchildren.  Patient stated that he is not sure whether or not he will return there upon discharge or if he will live with his sister in [place].  Patient expressed concerns about his lack of income and stated that he has applied for disability because he believes it will be a while before he is able to work.  Per patient, he has a phone interview on March 12 with disablity and is worried that he may miss the call due to his hospital stay.  Patient inquired about applying for Medicaid.  CSW will notify financial counseling in regards to Medicaid inquiry and disability phone interview.  Clinical Social Worker will follow up with patient and Artist.  CSW will remain available for support as needed.  Arnette Norris, MSW Intern  [place], [place] [phone]

## 2011-03-08 NOTE — Progress Notes (Signed)
UR of chart complete.  

## 2011-03-08 NOTE — Progress Notes (Signed)
14 Days Post-Op  Subjective: RLQ abscess drain placed 2/25 CT 3/1 showed partial resolution of collection with residual area ... Repositioned by Radiologist 3/2 by pulling back few cm.  Pt feeling some better.   Objective: Vital signs in last 24 hours: Temp:  [97.7 F (36.5 C)-98.9 F (37.2 C)] 98.5 F (36.9 C) (03/06 0507) Pulse Rate:  [88-97] 96  (03/06 0507) Resp:  [14-18] 14  (03/06 0507) BP: (93-118)/(64-75) 118/71 mmHg (03/06 0507) SpO2:  [92 %-99 %] 95 % (03/06 0507) Last BM Date: 03/05/11  Intake/Output from previous day: 03/05 0701 - 03/06 0700 In: 3756.3 [P.O.:60; I.V.:696; IV Piggyback:650; TPN:2345.3] Out: 1485 [Urine:925; Emesis/NG output:550; Drains:10] Intake/Output this shift:    PE:  Afeb; VSS Output 10cc 3/5; 10 cc in bag now: dark serous color Site clean and dry; NT Wbc down  Lab Results:   Basename 03/08/11 0455 03/07/11 0601  WBC 11.0* 15.1*  HGB 10.6* 11.4*  HCT 32.5* 34.8*  PLT 415* 446*   BMET  Basename 03/06/11 0528  NA 136  K 3.6  CL 96  CO2 30  GLUCOSE 136*  BUN 16  CREATININE 0.84  CALCIUM 9.8   PT/INR No results found for this basename: LABPROT:2,INR:2 in the last 72 hours ABG No results found for this basename: PHART:2,PCO2:2,PO2:2,HCO3:2 in the last 72 hours  Studies/Results: No results found.  Anti-infectives: Anti-infectives     Start     Dose/Rate Route Frequency Ordered Stop   03/06/11 1100   vancomycin (VANCOCIN) 1,250 mg in sodium chloride 0.9 % 250 mL IVPB        1,250 mg 166.7 mL/hr over 90 Minutes Intravenous Every 12 hours 03/06/11 1026     02/27/11 1400  piperacillin-tazobactam (ZOSYN) IVPB 3.375 g       3.375 g 12.5 mL/hr over 240 Minutes Intravenous 3 times per day 02/27/11 1027     02/26/11 1430   piperacillin-tazobactam (ZOSYN) IVPB 3.375 g  Status:  Discontinued        3.375 g 12.5 mL/hr over 240 Minutes Intravenous 4 times per day 02/26/11 1421 02/27/11 1027   02/15/11 1600   ertapenem (INVANZ) 1  g in sodium chloride 0.9 % 50 mL IVPB  Status:  Discontinued        1 g 100 mL/hr over 30 Minutes Intravenous Every 24 hours 02/14/11 1634 02/15/11 0813   02/14/11 1715   ertapenem (INVANZ) 1 g in sodium chloride 0.9 % 50 mL IVPB        1 g 100 mL/hr over 30 Minutes Intravenous To [facility] 02/14/11 1709 02/14/11 1753   02/14/11 1615   Ampicillin-Sulbactam (UNASYN) 3 g in sodium chloride 0.9 % 100 mL IVPB  Status:  Discontinued        3 g 100 mL/hr over 60 Minutes Intravenous Every 6 hours 02/14/11 1603 02/14/11 1634          Assessment/Plan: s/p Procedure(s) (LRB): EXPLORATORY LAPAROTOMY (N/A)  RLQ drain placed 2/25 Better; pulled back drain 3/2 When output less than 10cc/24hr- will need re CT- poss pull drain Plan per CCS   Rosanna Bickle A 03/08/2011

## 2011-03-08 NOTE — Progress Notes (Signed)
Physical Therapy Treatment Patient Details Name: Craig Garrett MRN: [id number] DOB: 1960-09-04 Today's Date: 03/08/2011  PT Assessment/Plan  PT - Assessment/Plan Comments on Treatment Session: Pt very ready to ambulate and be mobile however still limited by vertigo symptoms. Awaiting NG tube to be pulled in order to perform vestibular eval PT Plan: Discharge plan remains appropriate PT Frequency: Min 3X/week Follow Up Recommendations: Home health PT;Supervision - Intermittent Equipment Recommended: None recommended by PT PT Goals  Acute Rehab PT Goals PT Goal: Supine/Side to Sit - Progress: Met PT Goal: Sit to Supine/Side - Progress: Met PT Goal: Sit to Stand - Progress: Progressing toward goal PT Goal: Stand to Sit - Progress: Progressing toward goal  PT Treatment Precautions/Restrictions  Precautions Precautions: Fall Precaution Comments: + vertigo symptoms Required Braces or Orthoses: Yes Other Brace/Splint: right ankle air splint Restrictions Weight Bearing Restrictions: Yes RLE Weight Bearing: Touchdown weight bearing RLE Partial Weight Bearing Percentage or Pounds: TDWB Mobility (including Balance) Bed Mobility Supine to Sit: 6: Modified independent (Device/Increase time) Sitting - Scoot to Edge of Bed: 6: Modified independent (Device/Increase time) Sit to Supine: 6: Modified independent (Device/Increase time) Transfers Sit to Stand: With upper extremity assist;From bed;4: Min assist Sit to Stand Details (indicate cue type and reason): pt instructed to keep RLE off of floor Stand to Sit: 4: Min assist;With upper extremity assist;To bed Stand to Sit Details: Cues for control of descent    Exercise    End of Session PT - End of Session Activity Tolerance: Other (comment) (Limited by vertigo symptoms) General Behavior During Session: [facility] for tasks performed Cognition: [facility] for tasks performed  Craig Garrett, Craig Garrett 03/08/2011, 2:32 PM

## 2011-03-08 NOTE — Progress Notes (Signed)
Patient discussed at the Long Length of Stay Craig Garrett Weeks 03/08/2011  

## 2011-03-08 NOTE — Progress Notes (Signed)
Patient seen and examined.  WBC down.  Continue ng until he has consistent bowel activity.

## 2011-03-09 LAB — COMPREHENSIVE METABOLIC PANEL
BUN: 13 mg/dL (ref 6–23)
CO2: 27 mEq/L (ref 19–32)
Chloride: 102 mEq/L (ref 96–112)
Creatinine, Ser: 0.77 mg/dL (ref 0.50–1.35)
GFR calc non Af Amer: >90 mL/min (ref >90)
Glucose, Bld: 104 mg/dL — ABNORMAL HIGH (ref 70–99)
Total Bilirubin: 0.9 mg/dL (ref 0.3–1.2)

## 2011-03-09 LAB — GLUCOSE, CAPILLARY
Glucose-Capillary: 134 mg/dL — ABNORMAL HIGH (ref 70–99)
Glucose-Capillary: 120 mg/dL — ABNORMAL HIGH (ref 70–99)

## 2011-03-09 LAB — CBC
MCHC: 32.6 g/dL (ref 30.0–36.0)
RDW: 14.7 % (ref 11.5–15.5)

## 2011-03-09 LAB — MAGNESIUM: Magnesium: 1.8 mg/dL (ref 1.5–2.5)

## 2011-03-09 LAB — PHOSPHORUS: Phosphorus: 3.9 mg/dL (ref 2.3–4.6)

## 2011-03-09 MED ORDER — PANTOPRAZOLE SODIUM 40 MG IV SOLR
40.0000 mg | Freq: Two times a day (BID) | INTRAVENOUS | Status: DC
  Administered 2011-03-09 (×2): 40 mg via INTRAVENOUS
  Filled 2011-03-09 (×4): qty 40

## 2011-03-09 MED ORDER — INSULIN REGULAR HUMAN 100 UNIT/ML IJ SOLN
INTRAVENOUS | Status: AC
  Administered 2011-03-09: 17:00:00 via INTRAVENOUS
  Filled 2011-03-09: qty 2280

## 2011-03-09 MED ORDER — HYDROMORPHONE 0.3 MG/ML IV SOLN
INTRAVENOUS | Status: AC
  Administered 2011-03-09: 11:00:00
  Filled 2011-03-09: qty 25

## 2011-03-09 NOTE — Progress Notes (Signed)
D/C NGT Try sips WBC down Patient examined and I agree with the assessment and plan  Violeta Gelinas, MD, MPH, FACS Pager: [phone]  03/09/2011 9:06 AM
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
## 2011-03-09 NOTE — Progress Notes (Signed)
Physical Therapy Treatment Patient Details Name: Craig Garrett MRN: [id number] DOB: 31-Aug-1960 Today's Date: 03/09/2011  PT Assessment/Plan  PT - Assessment/Plan Comments on Treatment Session: Vestibular assessment initiated now that NG tube has been removed.  No nystagmus noted with occulomotor testing, but it does produce symptoms, no saccades noted, head shaking (+) for symptoms both vertical and horizontal, VOR cancellation (+) for symptoms.  Attempted modified dix hallpike to the left, but patient unable to perform quickly due to abdominal incision pain.  After one side he was not wanting to proceede further with testing.  No nystagmus noted to the left.  I still would like to do modified Dix to the right and re attempte the left faster if able as well as head turning test to truely r/o BPPV in both the posterior and horizontal canals.  The patient was educated on compensation technique of finding an object to focus on during gait and mobillity to decrease vertigo symptoms and he found these immediately beneficial.   PT Plan: Discharge plan remains appropriate PT Frequency: Min 3X/week Follow Up Recommendations: Home health PT;Supervision - Intermittent Equipment Recommended: Rolling walker with 5" wheels PT Goals  Acute Rehab PT Goals PT Goal: Supine/Side to Sit - Progress: Met PT Goal: Sit to Supine/Side - Progress: Met Pt will go Sit to Stand: with modified independence;with upper extremity assist PT Goal: Sit to Stand - Progress: Updated due to goal met Pt will go Stand to Sit: with modified independence;with upper extremity assist PT Goal: Stand to Sit - Progress: Updated due to goals met PT Goal: Ambulate - Progress: Progressing toward goal  PT Treatment Precautions/Restrictions  Precautions Precautions: Fall Precaution Comments: + vertigo symptoms Required Braces or Orthoses: Yes Other Brace/Splint: right ankle air splint Restrictions Weight Bearing Restrictions: Yes RLE  Weight Bearing: Touchdown weight bearing RLE Partial Weight Bearing Percentage or Pounds: TDWB Mobility (including Balance) Bed Mobility Supine to Sit: 6: Modified independent (Device/Increase time);With rails;HOB flat Sitting - Scoot to Edge of Bed: 6: Modified independent (Device/Increase time) Sit to Supine: 6: Modified independent (Device/Increase time);HOB flat;With rail Transfers Sit to Stand: 5: Supervision;From bed;With upper extremity assist;From toilet Sit to Stand Details (indicate cue type and reason): supervision for safety and possible balance assist.   Stand to Sit: 5: Supervision;To bed;With upper extremity assist;To toilet Stand to Sit Details: supervision for safety  Ambulation/Gait Ambulation/Gait: Yes Ambulation/Gait Assistance: 4: Min assist Ambulation/Gait Assistance Details (indicate cue type and reason): supervision primarily with min assist for LOB x2 with gait, verbal cues to not get so close to the front of the RW and for TDWB.  The patient was demonstrating foot flat PWB.   Ambulation Distance (Feet): 25 Feet Assistive device: Rolling walker Gait Pattern: Step-to pattern;Antalgic    End of Session PT - End of Session Equipment Utilized During Treatment:  (RW) Activity Tolerance: Patient limited by fatigue;Patient limited by pain (limited by dizziness.  ) Patient left: in bed;with call bell in reach (RN in room to perform dressing change.  ) General Behavior During Session: [facility] for tasks performed Cognition: [facility] for tasks performed  Marnae Madani B. Rasheema Truluck, PT, DPT [phone] 03/09/2011, 10:49 AM

## 2011-03-09 NOTE — Progress Notes (Signed)
Patient ID: Craig Garrett, male   DOB: [birth year], 51 y.o.   MRN: [id number]   LOS: 23 days   Subjective: C/o heartburn, not sure if TNA has additive, will add Protonix. NGT OP has been minimal overnight. C/o severe cramping type pain after having BM this am.  Remains anxious about removal of NGT, but feel this would help pt at this point.    Objective: Vital signs in last 24 hours: Temp:  [98 F (36.7 C)-98.5 F (36.9 C)] 98.5 F (36.9 C) (03/07 0639) Pulse Rate:  [89-93] 89  (03/07 0639) Resp:  [13-20] 15  (03/07 0639) BP: (99-114)/(65-74) 109/65 mmHg (03/07 0639) SpO2:  [93 %-98 %] 96 % (03/07 0639) Weight:  [79.8 kg (175 lb 14.8 oz)] 79.8 kg (175 lb 14.8 oz) (03/07 0639) Last BM Date: 03/08/11   NGT: 565ml/24h, and overnight JP: 32ml/24h   Lab Results:  CBC  Basename 03/09/11 0554 03/08/11 0455  WBC 10.6* 11.0*  HGB 10.5* 10.6*  HCT 32.2* 32.5*  PLT 402* 415*    General appearance: alert and mild distress Resp: clear to auscultation bilaterally Cardio: regular rate and rhythm GI: Soft, wound granulating well. Healing in very well, but still with some minimal  Purulent drainage. No odor. Minimal BS, but flat, soft  Assessment/Plan: PSBO s/p SB/colon resection/LOA- Wound with 2 open areas with drainage - tracks up from abscess as seen on CT, continue NS dressings  Ileus --seems resolved with pt having BM's and abd is soft, and flat, will DC NGT and try sips clears Right foot/ankle fx- Dr. Carola Frost  ABD abscess --Continues drain per IR, ? Can remove soon. ID -- Zosyn D#11, Vanc D#4, WBC down again today. Afebrile. Tachycardia -- Will see if we can wean metoprolol. L pleural effusion -- stable on previous F/U CXR  FEN -- Continue TNA , Add protonix VTE -- Lovenox  Dispo -- Ileus seems to be resolving, will DC NGT and starts sips as tolerated, Continue Devin Going Pager [phone] General Trauma Pager [phone]    03/09/2011

## 2011-03-09 NOTE — Progress Notes (Signed)
15 Days Post-Op  Subjective: RLQ drain placed 2/25; drain pulled back by Rad 3/2 Output continues to diminish Wbc down  Objective: Vital signs in last 24 hours: Temp:  [98 F (36.7 C)-98.5 F (36.9 C)] 98.5 F (36.9 C) (03/07 0639) Pulse Rate:  [89-93] 89  (03/07 0639) Resp:  [13-20] 18  (03/07 0846) BP: (99-114)/(65-74) 109/65 mmHg (03/07 0639) SpO2:  [93 %-98 %] 93 % (03/07 0846) Weight:  [175 lb 14.8 oz (79.8 kg)] 175 lb 14.8 oz (79.8 kg) (03/07 0639) Last BM Date: 03/08/11  Intake/Output from previous day: 03/06 0701 - 03/07 0700 In: 3979.5 [I.V.:480.3; NG/GT:300; IV Piggyback:650; TPN:2539.2] Out: 1510 [Urine:1200; Emesis/NG output:300; Drains:10] Intake/Output this shift:    PE:  Drain intact Afeb; vss; wbc down Site clean and dry Output 10 cc 3/6; minimal in bag now- dark serous    Lab Results:   [facility] 03/09/11 0554 03/08/11 0455  WBC 10.6* 11.0*  HGB 10.5* 10.6*  HCT 32.2* 32.5*  PLT 402* 415*   BMET  Basename 03/09/11 0554  NA 136  K 3.9  CL 102  CO2 27  GLUCOSE 104*  BUN 13  CREATININE 0.77  CALCIUM 8.8   PT/INR No results found for this basename: LABPROT:2,INR:2 in the last 72 hours ABG No results found for this basename: PHART:2,PCO2:2,PO2:2,HCO3:2 in the last 72 hours  Studies/Results: No results found.  Anti-infectives: Anti-infectives     Start     Dose/Rate Route Frequency Ordered Stop   03/06/11 1100   vancomycin (VANCOCIN) 1,250 mg in sodium chloride 0.9 % 250 mL IVPB        1,250 mg 166.7 mL/hr over 90 Minutes Intravenous Every 12 hours 03/06/11 1026     02/27/11 1400   piperacillin-tazobactam (ZOSYN) IVPB 3.375 g        3.375 g 12.5 mL/hr over 240 Minutes Intravenous 3 times per day 02/27/11 1027     02/26/11 1430   piperacillin-tazobactam (ZOSYN) IVPB 3.375 g  Status:  Discontinued        3.375 g 12.5 mL/hr over 240 Minutes Intravenous 4 times per day 02/26/11 1421 02/27/11 1027   02/15/11 1600   ertapenem (INVANZ) 1  g in sodium chloride 0.9 % 50 mL IVPB  Status:  Discontinued        1 g 100 mL/hr over 30 Minutes Intravenous Every 24 hours 02/14/11 1634 02/15/11 0813   02/14/11 1715   ertapenem (INVANZ) 1 g in sodium chloride 0.9 % 50 mL IVPB        1 g 100 mL/hr over 30 Minutes Intravenous [facility] 02/14/11 1709 02/14/11 1753   02/14/11 1615   Ampicillin-Sulbactam (UNASYN) 3 g in sodium chloride 0.9 % 100 mL IVPB  Status:  Discontinued        3 g 100 mL/hr over 60 Minutes Intravenous Every 6 hours 02/14/11 1603 02/14/11 1634          Assessment/Plan: s/p Procedure(s) (LRB): EXPLORATORY LAPAROTOMY (N/A)  RLQ drain placed 2/25; repositioned(pulled back) 3/2 Output minimal Consider re Ct soon Plan per CCS   Valerio Pinard A 03/09/2011

## 2011-03-10 LAB — CBC
Hemoglobin: 10.7 g/dL — ABNORMAL LOW (ref 13.0–17.0)
MCH: 28.6 pg (ref 26.0–34.0)
Platelets: 403 10*3/uL — ABNORMAL HIGH (ref 150–400)
RBC: 3.74 MIL/uL — ABNORMAL LOW (ref 4.22–5.81)
WBC: 10.7 10*3/uL — ABNORMAL HIGH (ref 4.0–10.5)

## 2011-03-10 LAB — BASIC METABOLIC PANEL
CO2: 26 mEq/L (ref 19–32)
Calcium: 8.8 mg/dL (ref 8.4–10.5)
Glucose, Bld: 111 mg/dL — ABNORMAL HIGH (ref 70–99)
Potassium: 3.8 mEq/L (ref 3.5–5.1)
Sodium: 135 mEq/L (ref 135–145)

## 2011-03-10 LAB — GLUCOSE, CAPILLARY
Glucose-Capillary: 125 mg/dL — ABNORMAL HIGH (ref 70–99)
Glucose-Capillary: 113 mg/dL — ABNORMAL HIGH (ref 70–99)

## 2011-03-10 MED ORDER — OXYCODONE HCL 5 MG PO TABS: 5.0000 mg | ORAL_TABLET | ORAL | Status: DC | PRN

## 2011-03-10 MED ORDER — HYDROMORPHONE 0.3 MG/ML IV SOLN
INTRAVENOUS | Status: DC
  Administered 2011-03-10 (×2): 0.3 mg via INTRAVENOUS
  Administered 2011-03-10 – 2011-03-11 (×2): 0.6 mg via INTRAVENOUS
  Administered 2011-03-11: 0.9 mg via INTRAVENOUS
  Administered 2011-03-11: 1 mL via INTRAVENOUS
  Administered 2011-03-12 (×3): 0.3 mg via INTRAVENOUS
  Administered 2011-03-12: 0.9 mg via INTRAVENOUS
  Administered 2011-03-12 – 2011-03-13 (×2): 0.3 mg via INTRAVENOUS

## 2011-03-10 MED ORDER — PANTOPRAZOLE SODIUM 40 MG PO TBEC
40.0000 mg | DELAYED_RELEASE_TABLET | Freq: Two times a day (BID) | ORAL | Status: DC
  Administered 2011-03-10 – 2011-03-25 (×30): 40 mg via ORAL
  Filled 2011-03-10 (×30): qty 1

## 2011-03-10 MED ORDER — TRACE MINERALS CR-CU-MN-SE-ZN 10-1000-500-60 MCG/ML IV SOLN
INTRAVENOUS | Status: AC
  Administered 2011-03-10: 17:00:00 via INTRAVENOUS
  Filled 2011-03-10: qty 2280

## 2011-03-10 MED ORDER — METOPROLOL TARTRATE 12.5 MG HALF TABLET
12.5000 mg | ORAL_TABLET | Freq: Two times a day (BID) | ORAL | Status: DC
  Administered 2011-03-10 – 2011-03-12 (×5): 12.5 mg via ORAL
  Filled 2011-03-10 (×8): qty 1

## 2011-03-10 MED ORDER — FAT EMULSION 20 % IV EMUL
250.0000 mL | INTRAVENOUS | Status: AC
  Administered 2011-03-10: 250 mL via INTRAVENOUS
  Filled 2011-03-10: qty 250

## 2011-03-10 NOTE — Progress Notes (Signed)
Physical Therapy Treatment Patient Details Name: Craig Garrett MRN: [id number] DOB: January 20, 1960 Today's Date: 03/10/2011  PT Assessment/Plan  PT - Assessment/Plan Comments on Treatment Session: Continued vestibular assessment.  Head trun test in supine (-) bil, however, when patient turns his head to the right and looks up (tilting his head back) there is some mild rotational nustagmus noted.  Also noted with testing today poor eye alignment (left seems to drift).  The patient has no history of "lazy eye" or strabismus.  I anticipate for R dix hallpike to be positive, but the patient did not want to continue testing or get OOB secondary to stomach pains.  Will continue assessment on Monday 03/13/10.  PT Plan: Discharge plan remains appropriate;Frequency remains appropriate PT Frequency: Min 3X/week Follow Up Recommendations: Home health PT Equipment Recommended: Rolling walker with 5" wheels PT Goals  Acute Rehab PT Goals PT Goal: Supine/Side to Sit - Progress: Met PT Goal: Sit to Supine/Side - Progress: Met PT Goal: Sit to Stand - Progress: Met PT Goal: Stand to Sit - Progress: Met Pt will Transfer Bed to Chair/Chair to Bed: with modified independence PT Transfer Goal: Bed to Chair/Chair to Bed - Progress: Met PT Goal: Ambulate - Progress: Progressing toward goal  PT Treatment Precautions/Restrictions  Precautions Precautions: Fall Precaution Comments: + vertigo symptoms Required Braces or Orthoses: Yes Other Brace/Splint: right ankle air splint Restrictions Weight Bearing Restrictions: Yes RLE Weight Bearing: Touchdown weight bearing RLE Partial Weight Bearing Percentage or Pounds: TDWB Mobility (including Balance) Bed Mobility Supine to Sit: 6: Modified independent (Device/Increase time);With rails;HOB flat Sitting - Scoot to Edge of Bed: 6: Modified independent (Device/Increase time) Sit to Supine: 6: Modified independent (Device/Increase time);HOB flat;With rail Transfers Sit  to Stand: 6: Modified independent (Device/Increase time);From bed Sit to Stand Details (indicate cue type and reason): patient is not maintaining TDWB on his foot.   Stand to Sit: 6: Modified independent (Device/Increase time);To chair/3-in-1 Stand to Sit Details: the patietn has been getting up all morining to [facility] without RNs assist multiple loose BMs, getting tested to r/o c-diff Ambulation/Gait Ambulation/Gait: No (refused)    End of Session PT - End of Session Activity Tolerance: Patient limited by pain Patient left: in bed;with call bell in reach General Behavior During Session: [facility] for tasks performed Cognition: [facility] for tasks performed  Orla Estrin B. Lakeisa Heninger, PT, DPT [phone] 03/10/2011, 12:19 PM

## 2011-03-10 NOTE — Progress Notes (Signed)
Patient ID: Craig Garrett, male   DOB: 07-13-1960, 51 y.o.   MRN: [id number]   LOS: 24 days   Subjective: No N/V after NG tube Dc yesterday, but reports frequent loose stools, as many as 4-5 overnight.  Less abd cramping with BM's though, and overall feels somewhat better. Still does not want to take much po, and only drank small amounts.    Objective: Vital signs in last 24 hours: Temp:  [97.8 F (36.6 C)-99.2 F (37.3 C)] 98.5 F (36.9 C) (03/08 0535) Pulse Rate:  [73-94] 73  (03/08 0535) Resp:  [16-19] 19  (03/08 0750) BP: (105-121)/(68-72) 112/72 mmHg (03/08 0535) SpO2:  [92 %-99 %] 98 % (03/08 0750) Last BM Date: 03/10/11    JP: 12.79ml/24h   Lab Results:  CBC  Basename 03/10/11 0515 03/09/11 0554  WBC 10.7* 10.6*  HGB 10.7* 10.5*  HCT 32.0* 32.2*  PLT 403* 402*    General appearance: alert and mild distress Resp: clear to auscultation bilaterally Cardio: regular rate and rhythm GI: Soft, wound granulating well. Healing in very well, but still with some minimal  Purulent drainage. No odor. Minimal BS, but flat, soft  Assessment/Plan: PSBO s/p SB/colon resection/LOA- Wound with 2 open areas with drainage - tracks up from abscess as seen on CT, continue NS dressings  Ileus --seems resolved with pt having BM's and abd is soft, and flat,will check C Diff with loose stools Right foot/ankle fx- Dr. Carola Frost  ABD abscess --Continues drain per IR, ? Can remove soon. ID -- Zosyn D#12, Vanc D#5, WBC 10.7 and afebrile Tachycardia -- Will see if we can wean metoprolol. L pleural effusion -- stable on previous F/U CXR  FEN -- Continue TNA , Added protonix BID and will check C diff VTE -- Lovenox  Dispo --Continue sips clears, check C diff, continue TNA for now as intake is very minimal po  Dierks Wach,PA-C Pager [phone] General Trauma Pager [phone]    03/10/2011
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
## 2011-03-10 NOTE — Progress Notes (Signed)
16 Days Post-Op  Subjective: RLQ drain placed 2/25 Output minimal at this point No growth on cx  Objective: Vital signs in last 24 hours: Temp:  [98.5 F (36.9 C)-99.2 F (37.3 C)] 98.7 F (37.1 C) (03/08 0900) Pulse Rate:  [73-94] 90  (03/08 0900) Resp:  [16-19] 19  (03/08 1131) BP: (112-121)/(71-75) 115/75 mmHg (03/08 0900) SpO2:  [92 %-98 %] 97 % (03/08 1131) Last BM Date: 03/10/11  Intake/Output from previous day: 03/07 0701 - 03/08 0700 In: 3432.1 [P.O.:60; I.V.:484.7; IV Piggyback:657.5; TPN:2214.9] Out: 12.5 [Drains:12.5] Intake/Output this shift:    PE:  Output very minimal VSS; afeb; wbc wnl Site clean and dry Cx no growth  Lab Results:   Basename 03/10/11 0515 03/09/11 0554  WBC 10.7* 10.6*  HGB 10.7* 10.5*  HCT 32.0* 32.2*  PLT 403* 402*   BMET  Basename 03/10/11 0515 03/09/11 0554  NA 135 136  K 3.8 3.9  CL 103 102  CO2 26 27  GLUCOSE 111* 104*  BUN 11 13  CREATININE 0.76 0.77  CALCIUM 8.8 8.8   PT/INR No results found for this basename: LABPROT:2,INR:2 in the last 72 hours ABG No results found for this basename: PHART:2,PCO2:2,PO2:2,HCO3:2 in the last 72 hours  Studies/Results: No results found.  Anti-infectives: Anti-infectives     Start     Dose/Rate Route Frequency Ordered Stop   03/06/11 1100   vancomycin (VANCOCIN) 1,250 mg in sodium chloride 0.9 % 250 mL IVPB        1,250 mg 166.7 mL/hr over 90 Minutes Intravenous Every 12 hours 03/06/11 1026     02/27/11 1400  piperacillin-tazobactam (ZOSYN) IVPB 3.375 g       3.375 g 12.5 mL/hr over 240 Minutes Intravenous 3 times per day 02/27/11 1027     02/26/11 1430   piperacillin-tazobactam (ZOSYN) IVPB 3.375 g  Status:  Discontinued        3.375 g 12.5 mL/hr over 240 Minutes Intravenous 4 times per day 02/26/11 1421 02/27/11 1027   02/15/11 1600   ertapenem (INVANZ) 1 g in sodium chloride 0.9 % 50 mL IVPB  Status:  Discontinued        1 g 100 mL/hr over 30 Minutes Intravenous Every  24 hours 02/14/11 1634 02/15/11 0813   02/14/11 1715   ertapenem (INVANZ) 1 g in sodium chloride 0.9 % 50 mL IVPB        1 g 100 mL/hr over 30 Minutes Intravenous To [facility] 02/14/11 1709 02/14/11 1753   02/14/11 1615   Ampicillin-Sulbactam (UNASYN) 3 g in sodium chloride 0.9 % 100 mL IVPB  Status:  Discontinued        3 g 100 mL/hr over 60 Minutes Intravenous Every 6 hours 02/14/11 1603 02/14/11 1634          Assessment/Plan: s/p Procedure(s) (LRB): EXPLORATORY LAPAROTOMY (N/A)  Will need CT before drain removal Plan per CCS   Gunhild Bautch A 03/10/2011

## 2011-03-11 LAB — GLUCOSE, CAPILLARY: Glucose-Capillary: 102 mg/dL — ABNORMAL HIGH (ref 70–99)

## 2011-03-11 LAB — CBC
MCH: 28.2 pg (ref 26.0–34.0)
MCV: 85.1 fL (ref 78.0–100.0)
Platelets: 433 10*3/uL — ABNORMAL HIGH (ref 150–400)
RBC: 3.83 MIL/uL — ABNORMAL LOW (ref 4.22–5.81)
RDW: 14.8 % (ref 11.5–15.5)
WBC: 11.2 10*3/uL — ABNORMAL HIGH (ref 4.0–10.5)

## 2011-03-11 MED ORDER — INSULIN REGULAR HUMAN 100 UNIT/ML IJ SOLN
INTRAVENOUS | Status: AC
  Administered 2011-03-11: 18:00:00 via INTRAVENOUS
  Filled 2011-03-11: qty 2280

## 2011-03-11 NOTE — Progress Notes (Signed)
Patient ID: Craig Garrett, male   DOB: Aug 28, 1960, 51 y.o.   MRN: [id number]    LOS: 25 days   Subjective: Still c/o severe cramping type abd pain and worsens whenever he has a BM. He is reporting loose stools with each BM. C diff yesterday negative.   Objective: Vital signs in last 24 hours: Temp:  [97.9 F (36.6 C)-98.3 F (36.8 C)] 98.1 F (36.7 C) (03/09 0615) Pulse Rate:  [65-99] 82  (03/09 0615) Resp:  [14-24] 18  (03/09 0852) BP: (114-126)/(61-75) 120/68 mmHg (03/09 0615) SpO2:  [97 %-100 %] 99 % (03/09 0852) Last BM Date: 03/10/11    JP: 62ml/24h   Lab Results:  CBC  Basename 03/10/11 0515 03/09/11 0554  WBC 10.7* 10.6*  HGB 10.7* 10.5*  HCT 32.0* 32.2*  PLT 403* 402*    General appearance: alert and mild distress Resp: clear to auscultation bilaterally Cardio: regular rate and rhythm GI: Soft, wound granulating well. Healing in very well, but still with some minimal  Purulent drainage. No odor. Minimal BS, but flat, soft  Assessment/Plan: PSBO s/p SB/colon resection/LOA- Wound with 2 open areas with drainage - tracks up from abscess as seen on CT, continue NS dressings  Ileus --seems resolved with pt having BM's and abd is soft,  loose stools, C diff -, ? antispasmodic Right foot/ankle fx- Dr. Carola Frost  ABD abscess --Continues drain per IR, ? Can remove soon. ID -- Zosyn D#13, Vanc D#6, WBC 10.7 and afebrile, ? DC both antibiotics tomorrow Tachycardia -- Will see if we can wean metoprolol. L pleural effusion -- stable on previous F/U CXR  FEN -- Continue TNA , Added protonix BID and will check C diff VTE -- Lovenox  Dispo --? Would benefit from antispasmodic? Like robinul or bentyl- Will discuss with MD  Pt encouraged to start taking po pain medication again and will plan to DC PCA tomorrow.  Will plan to check follow up CT scan on 03/13/11 to see if drain can be DC'ed  Craig Vanbenschoten,PA-C Pager [phone] General Trauma Pager  [phone]     03/11/2011
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
## 2011-03-12 ENCOUNTER — Inpatient Hospital Stay (HOSPITAL_COMMUNITY): Payer: Medicaid Other

## 2011-03-12 ENCOUNTER — Other Ambulatory Visit: Payer: Self-pay

## 2011-03-12 LAB — BASIC METABOLIC PANEL
BUN: 9 mg/dL (ref 6–23)
CO2: 24 mEq/L (ref 19–32)
Calcium: 8.6 mg/dL (ref 8.4–10.5)
Calcium: 8.5 mg/dL (ref 8.4–10.5)
Chloride: 103 mEq/L (ref 96–112)
Creatinine, Ser: 0.76 mg/dL (ref 0.50–1.35)
Creatinine, Ser: 0.77 mg/dL (ref 0.50–1.35)
GFR calc Af Amer: >90 mL/min (ref >90)
GFR calc Af Amer: >90 mL/min (ref >90)
GFR calc non Af Amer: >90 mL/min (ref >90)
Sodium: 134 mEq/L — ABNORMAL LOW (ref 135–145)

## 2011-03-12 LAB — CBC
MCH: 28.8 pg (ref 26.0–34.0)
MCV: 86.9 fL (ref 78.0–100.0)
Platelets: 401 10*3/uL — ABNORMAL HIGH (ref 150–400)
RDW: 15.2 % (ref 11.5–15.5)
WBC: 9.8 10*3/uL (ref 4.0–10.5)

## 2011-03-12 LAB — GLUCOSE, CAPILLARY: Glucose-Capillary: 125 mg/dL — ABNORMAL HIGH (ref 70–99)

## 2011-03-12 LAB — CARDIAC PANEL(CRET KIN+CKTOT+MB+TROPI): Relative Index: INVALID (ref 0.0–2.5)

## 2011-03-12 MED ORDER — INSULIN REGULAR HUMAN 100 UNIT/ML IJ SOLN
INTRAVENOUS | Status: AC
  Administered 2011-03-12: 17:00:00 via INTRAVENOUS
  Filled 2011-03-12: qty 2280

## 2011-03-12 MED ORDER — IOHEXOL 300 MG/ML  SOLN
100.0000 mL | Freq: Once | INTRAMUSCULAR | Status: AC | PRN
  Administered 2011-03-12: 100 mL via INTRAVENOUS

## 2011-03-12 MED ORDER — HYDROMORPHONE 0.3 MG/ML IV SOLN
INTRAVENOUS | Status: AC
  Filled 2011-03-12: qty 25

## 2011-03-12 NOTE — Progress Notes (Signed)
Late entry.Pt. vomited after full liquid lunch on 03/11/11. Anti-emetic effective.
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
## 2011-03-12 NOTE — Progress Notes (Addendum)
18 Days Post-Op  Subjective: Some left arm/shoulder pain (not really chest pain) he complained about earlier, no sob, has nausea and episode emesis trying to eat yesterday  Objective: Vital signs in last 24 hours: Temp:  [98.1 F (36.7 C)-98.9 F (37.2 C)] 98.1 F (36.7 C) (03/10 0558) Pulse Rate:  [69-77] 77  (03/10 0558) Resp:  [17-20] 20  (03/10 0953) BP: (105-131)/(65-74) 105/70 mmHg (03/10 0558) SpO2:  [96 %-99 %] 96 % (03/10 0558) FiO2 (%):  [99 %] 99 % (03/09 1553) Last BM Date: 03/11/11  Intake/Output from previous day: 03/09 0701 - 03/10 0700 In: 1708 [P.O.:960; TPN:748] Out: 700 [Urine:700] Intake/Output this shift: Total I/O In: 360 [P.O.:360] Out: -   General appearance: no distress Resp: diminished breath sounds bibasilar Cardio: rrr GI: wound with granulation tissue present, no infection, bs present, appropr tender  Lab Results:   Basename 03/12/11 0544 03/11/11 1300  WBC 9.8 11.2*  HGB 11.0* 10.8*  HCT 33.2* 32.6*  PLT 401* 433*   BMET  Basename 03/12/11 0825 03/12/11 0544  NA 133* 134*  K 3.8 3.7  CL 103 103  CO2 24 23  GLUCOSE 113* 106*  BUN 9 9  CREATININE 0.77 0.76  CALCIUM 8.5 8.6    Assessment/Plan: S/p elap with loa 1. Continue pca 2. pulm toilet 3. Clear liquids, will check ct today as was planned for tomorrow due to symptoms.  Although wbc normal and afebrile, drainage decreased think would be good to follow this up today 4. Will cont abx for today until ct results back. If that is ok then can likely stop tomorrow. 5. Troponin normal, will check cxr today also   LOS: 26 days    [facility] 03/12/2011
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
## 2011-03-13 LAB — COMPREHENSIVE METABOLIC PANEL
ALT: 129 U/L — ABNORMAL HIGH (ref 0–53)
AST: 56 U/L — ABNORMAL HIGH (ref 0–37)
Alkaline Phosphatase: 343 U/L — ABNORMAL HIGH (ref 39–117)
Calcium: 8.8 mg/dL (ref 8.4–10.5)
Potassium: 3.9 mEq/L (ref 3.5–5.1)
Sodium: 135 mEq/L (ref 135–145)
Total Protein: 6.4 g/dL (ref 6.0–8.3)

## 2011-03-13 LAB — DIFFERENTIAL
Eosinophils Relative: 2 % (ref 0–5)
Lymphocytes Relative: 20 % (ref 12–46)
Lymphs Abs: 2.1 10*3/uL (ref 0.7–4.0)
Monocytes Absolute: 0.8 10*3/uL (ref 0.1–1.0)
Monocytes Relative: 8 % (ref 3–12)

## 2011-03-13 LAB — CBC
HCT: 34.4 % — ABNORMAL LOW (ref 39.0–52.0)
Hemoglobin: 11.4 g/dL — ABNORMAL LOW (ref 13.0–17.0)
MCV: 86 fL (ref 78.0–100.0)
RBC: 4 MIL/uL — ABNORMAL LOW (ref 4.22–5.81)
WBC: 10.2 10*3/uL (ref 4.0–10.5)

## 2011-03-13 LAB — GLUCOSE, CAPILLARY: Glucose-Capillary: 111 mg/dL — ABNORMAL HIGH (ref 70–99)

## 2011-03-13 LAB — TRIGLYCERIDES: Triglycerides: 78 mg/dL (ref <150)

## 2011-03-13 LAB — CHOLESTEROL, TOTAL: Cholesterol: 86 mg/dL (ref 0–200)

## 2011-03-13 LAB — PREALBUMIN: Prealbumin: 37 mg/dL — ABNORMAL HIGH (ref 17.0–34.0)

## 2011-03-13 MED ORDER — FAT EMULSION 20 % IV EMUL
250.0000 mL | INTRAVENOUS | Status: AC
  Administered 2011-03-13: 250 mL via INTRAVENOUS
  Filled 2011-03-13: qty 250

## 2011-03-13 MED ORDER — TRAMADOL HCL 50 MG PO TABS
50.0000 mg | ORAL_TABLET | Freq: Four times a day (QID) | ORAL | Status: DC | PRN
  Administered 2011-03-13 – 2011-03-15 (×4): 50 mg via ORAL
  Administered 2011-03-15 – 2011-03-26 (×11): 100 mg via ORAL
  Filled 2011-03-13 (×3): qty 2
  Filled 2011-03-13: qty 1
  Filled 2011-03-13: qty 2
  Filled 2011-03-13: qty 1
  Filled 2011-03-13 (×8): qty 2

## 2011-03-13 MED ORDER — DICYCLOMINE HCL 20 MG PO TABS
20.0000 mg | ORAL_TABLET | Freq: Three times a day (TID) | ORAL | Status: DC
  Administered 2011-03-13 – 2011-03-27 (×53): 20 mg via ORAL
  Filled 2011-03-13 (×61): qty 1

## 2011-03-13 MED ORDER — BOOST / RESOURCE BREEZE PO LIQD
1.0000 | Freq: Three times a day (TID) | ORAL | Status: DC
  Administered 2011-03-15 – 2011-03-17 (×3): 1 via ORAL

## 2011-03-13 MED ORDER — HYDROMORPHONE HCL PF 1 MG/ML IJ SOLN
0.5000 mg | INTRAMUSCULAR | Status: DC | PRN
  Administered 2011-03-15 – 2011-03-23 (×18): 0.5 mg via INTRAVENOUS
  Filled 2011-03-13 (×20): qty 1

## 2011-03-13 MED ORDER — ENOXAPARIN SODIUM 40 MG/0.4ML ~~LOC~~ SOLN
40.0000 mg | SUBCUTANEOUS | Status: DC
  Administered 2011-03-13 – 2011-03-24 (×12): 40 mg via SUBCUTANEOUS
  Filled 2011-03-13 (×12): qty 0.4

## 2011-03-13 MED ORDER — TRACE MINERALS CR-CU-MN-SE-ZN 10-1000-500-60 MCG/ML IV SOLN
INTRAVENOUS | Status: AC
  Administered 2011-03-13: 17:00:00 via INTRAVENOUS
  Filled 2011-03-13: qty 2280

## 2011-03-13 NOTE — Progress Notes (Signed)
The patient is dejected because he vomited again this morning.  He does not feel as though he is getting better.  His abdomen is soft and minimally tender.  Still on TNA.  Will continue to see if he can eat.  This patient has been seen and I agree with the findings and treatment plan.  Marta Lamas. Gae Bon, MD, FACS [phone] (pager) [phone] (direct pager) Trauma Surgeon

## 2011-03-13 NOTE — Progress Notes (Signed)
Patient ID: Craig Garrett, male   DOB: [birth year], 50 y.o.   MRN: [id number]   LOS: 27 days   Subjective: Has had good night and morning. Has not tried to eat anything lately. When he has pain he describes a dull ache beginning in epigrastrium and radiating inferiorly.   Objective: Vital signs in last 24 hours: Temp:  [97.6 F (36.4 C)-98.9 F (37.2 C)] 97.6 F (36.4 C) (03/11 0559) Pulse Rate:  [73-96] 96  (03/11 0559) Resp:  [17-20] 20  (03/11 0621) BP: (109-126)/(56-77) 117/73 mmHg (03/11 0559) FiO2 (%):  [99 %] 99 % (03/10 1702) Last BM Date: 03/13/11   JP: 75ml/24h   Lab Results:  CBC  Basename 03/13/11 0607 03/12/11 0544  WBC 10.2 9.8  HGB 11.4* 11.0*  HCT 34.4* 33.2*  PLT 405* 401*   BMET  Basename 03/13/11 0607 03/12/11 0825  NA 135 133*  K 3.9 3.8  CL 102 103  CO2 25 24  GLUCOSE 111* 113*  BUN 9 9  CREATININE 0.79 0.77  CALCIUM 8.8 8.5    *RADIOLOGY REPORT*  Clinical Data: 51 year old male - follow-up abdominal and pelvic  abscesses. Right percutaneous drainage catheter in place.  Multiple abdominal surgeries.  CT ABDOMEN AND PELVIS WITH CONTRAST  Technique: Multidetector CT imaging of the abdomen and pelvis was  performed following the standard protocol during bolus  administration of intravenous contrast.  Contrast: OMNIPAQUE IOHEXOL 300 MG/ML IJ SOLN  Comparison: 03/03/2011  Findings: A small to moderate left pleural effusion and mild to  moderate bibasilar atelectasis, left greater than right are again  noted.  A 1 x 1.2 cm hypoechoic lesion in the left liver (image 20) and a  0.8 x 1.4 cm low density lesion in the posterior right hepatic  segment (image 12) are unchanged.  The spleen, pancreas, gallbladder, adrenal glands and kidneys are  unremarkable.  An anterior right pelvic percutaneous drain is identified with  small residual fluid collection adjacent to the drain, now measures  0.7 x 6 cm, previously 2.8 x 10 cm.  The abscess  collections within the lower left abdomen and lower  central pelvis have resolved.  Stranding within the pelvis is unchanged.  There is no evidence of free fluid, new abscess, pneumoperitoneum  or bowel obstruction.  No acute or suspicious bony abnormalities are identified.  IMPRESSION:  Significant reduction in size of the right pelvic abscess adjacent  to the percutaneous drain. Resolution of abscess collections  within the lower left abdomen and lower central pelvis.  No other significant changes.  Stable small to moderate left pleural effusion and mild to moderate  bibasilar atelectasis.  Original Report Authenticated By: Rosendo Gros, M.D.       General appearance: alert and no distress Resp: clear to auscultation bilaterally Cardio: regular rate and rhythm GI: Soft, +BS. Wound granulating well. Still able to express purulence from lower part of wound.  Assessment/Plan: PSBO s/p SB/colon resection/LOA- Wound continues to improve. Ileus --seems resolved, unsure of what to make of continued N/V with PO's. Will add bentyl to see if that will help with pain. Right foot/ankle fx- Dr. Carola Frost  ABD abscess -- I think drain can come out, will let IR know. ID -- Zosyn D#15, Vanc D#8. Afebrile, WBC WNL. Will stop Zosyn but continue Vanc as still draining purulent material. Tachycardia -- Will see if we can wean metoprolol.  L pleural effusion -- stable on previous F/U CXR  FEN -- Continue TNA , Added  protonix BID  VTE -- Restart lovenox, has been off since the 24th. Stopped for drain placement I think and never restarted. Dispo -- D/C PCA. Will try to do without narcotics. ? GI consult for EGD -- could his pain/N/V be secondary to PUD?    [name], PA-C Pager: [phone] General Trauma PA Pager: [phone]   03/13/2011
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
## 2011-03-13 NOTE — Progress Notes (Signed)
19 Days Post-Op  Subjective:  C/o extreme fatigue and nausea with eating.    Objective: Vital signs in last 24 hours: Temp:  [97.6 F (36.4 C)-98.7 F (37.1 C)] 98.7 F (37.1 C) (03/11 0813) Pulse Rate:  [75-96] 88  (03/11 0813) Resp:  [17-20] 18  (03/11 0813) BP: (109-126)/(65-77) 113/65 mmHg (03/11 0813) SpO2:  [18 %-100 %] 98 % (03/11 0813) FiO2 (%):  [99 %] 99 % (03/10 1702) Last BM Date: 03/13/11  Intake/Output from previous day: 03/10 0701 - 03/11 0700 In: 1740 [P.O.:720; I.V.:160; TPN:860] Out: 2360 [Urine:2350; Drains:10]  Intake/Output this shift: minimal output from drain with mostly resolved collection seen on CT.    Physical exam:  Thin appearing, nauseated.  Drain intact without leakage.  Flushed with 5 ml NS with clear return.  Output has been only that of flushes last 72 hours.  Drain removed at bedside without difficulty. Patient tolerated well.  Abdomen soft, non-distended.   Lab Results:   [facility] 03/13/11 0607 03/12/11 0544  WBC 10.2 9.8  HGB 11.4* 11.0*  HCT 34.4* 33.2*  PLT 405* 401*   BMET  Basename 03/13/11 0607 03/12/11 0825  NA 135 133*  K 3.9 3.8  CL 102 103  CO2 25 24  GLUCOSE 111* 113*  BUN 9 9  CREATININE 0.79 0.77  CALCIUM 8.8 8.5    Studies/Results: Dg Chest 2 View  03/12/2011  *RADIOLOGY REPORT*  Clinical Data: Chest pain.  CHEST - 2 VIEW  Comparison: 03/04/2011.  Findings: The NG tube has been removed.  The right PICC line is stable.  The lungs are much better aerated with resolving edema atelectasis and effusions. A small left pleural effusion persist with overlying atelectasis.  IMPRESSION: Much improved lung aeration with resolving edema, atelectasis and effusions.  Original Report Authenticated By: P. Loralie Champagne, M.D.   Ct Abdomen Pelvis W Contrast  03/12/2011  *RADIOLOGY REPORT*  Clinical Data: 51 year old male - follow-up abdominal and pelvic abscesses.  Right percutaneous drainage catheter in place. Multiple abdominal  surgeries.  CT ABDOMEN AND PELVIS WITH CONTRAST  Technique:  Multidetector CT imaging of the abdomen and pelvis was performed following the standard protocol during bolus administration of intravenous contrast.  Contrast: OMNIPAQUE IOHEXOL 300 MG/ML IJ SOLN  Comparison: 03/03/2011  Findings: A small to moderate left pleural effusion and mild to moderate bibasilar atelectasis, left greater than right are again noted. A 1 x 1.2 cm hypoechoic lesion in the left liver (image 20) and a 0.8 x 1.4 cm low density lesion in the posterior right hepatic segment (image 12) are unchanged. The spleen, pancreas, gallbladder, adrenal glands and kidneys are unremarkable.  An anterior right pelvic percutaneous drain is identified with small residual fluid collection adjacent to the drain, now measures 0.7 x 6 cm, previously 2.8 x 10 cm. The abscess collections within the lower left abdomen and lower central pelvis have resolved. Stranding within the pelvis is unchanged. There is no evidence of free fluid, new abscess, pneumoperitoneum or bowel obstruction.  No acute or suspicious bony abnormalities are identified.  IMPRESSION: Significant reduction in size of the right pelvic abscess adjacent to the percutaneous drain.  Resolution of abscess collections within the lower left abdomen and lower central pelvis.  No other significant changes.  Stable small to moderate left pleural effusion and mild to moderate bibasilar atelectasis.  Original Report Authenticated By: Rosendo Gros, M.D.    Anti-infectives: Anti-infectives     Start     Dose/Rate Route  Frequency Ordered Stop   03/06/11 1100   vancomycin (VANCOCIN) 1,250 mg in sodium chloride 0.9 % 250 mL IVPB        1,250 mg 166.7 mL/hr over 90 Minutes Intravenous Every 12 hours 03/06/11 1026     02/27/11 1400   piperacillin-tazobactam (ZOSYN) IVPB 3.375 g  Status:  Discontinued        3.375 g 12.5 mL/hr over 240 Minutes Intravenous 3 times per day 02/27/11 1027  03/13/11 0811   02/26/11 1430   piperacillin-tazobactam (ZOSYN) IVPB 3.375 g  Status:  Discontinued        3.375 g 12.5 mL/hr over 240 Minutes Intravenous 4 times per day 02/26/11 1421 02/27/11 1027   02/15/11 1600   ertapenem (INVANZ) 1 g in sodium chloride 0.9 % 50 mL IVPB  Status:  Discontinued        1 g 100 mL/hr over 30 Minutes Intravenous Every 24 hours 02/14/11 1634 02/15/11 0813   02/14/11 1715   ertapenem (INVANZ) 1 g in sodium chloride 0.9 % 50 mL IVPB        1 g 100 mL/hr over 30 Minutes Intravenous To [facility] 02/14/11 1709 02/14/11 1753   02/14/11 1615   Ampicillin-Sulbactam (UNASYN) 3 g in sodium chloride 0.9 % 100 mL IVPB  Status:  Discontinued        3 g 100 mL/hr over 60 Minutes Intravenous Every 6 hours 02/14/11 1603 02/14/11 1634          Assessment/Plan: s/p Procedure(s) (LRB): EXPLORATORY LAPAROTOMY (N/A)  S/p drainage of RLQ collection with almost complete resolution with no output above flushes last 72 hours.  Drain d/c'd at bedside without difficulty.  Dressing changes prn until scab formation. IR signing off at this time.   LOS: 27 days    Mylz Yuan D 03/13/2011

## 2011-03-13 NOTE — Progress Notes (Signed)
Nutrition Follow-up  No nausea today, BM today-diarrhea, has taken small amounts of clear liquid diet.  Feels "full"  Diet Order:  Clear Liquid  TPN:  Clinimix E5/20 at 95 ml/hr with 15 units of regular insulin. 20% lipids at 10 ml/hr, trace elements and multivitamin supplement MWF due to [employer]. TPN plus lipids providing 2212 kcals, 114 grams protein daily (based on weekly average).     Meds: Scheduled Meds:   . dicyclomine  20 mg Oral TID AC & HS  . enoxaparin (LOVENOX) injection  40 mg Subcutaneous Q24H  . HYDROmorphone PCA 0.3 mg/mL      . insulin aspart  0-9 Units Subcutaneous Q12H  . pantoprazole  40 mg Oral BID AC  . polyvinyl alcohol  1 drop Right Eye QID  . vancomycin  1,250 mg Intravenous Q12H  . DISCONTD: HYDROmorphone PCA 0.3 mg/mL   Intravenous Q4H  . DISCONTD: metoprolol tartrate  12.5 mg Oral BID  . DISCONTD: piperacillin-tazobactam (ZOSYN)  IV  3.375 g Intravenous Q8H   Continuous Infusions:   . 0.45 % NaCl with KCl 20 mEq / L 20 mL/hr (03/11/11 2146)  . fat emulsion    . TPN (CLINIMIX) +/- additives 95 mL/hr at 03/11/11 1807  . TPN (CLINIMIX) +/- additives 95 mL/hr at 03/12/11 1728  . TPN (CLINIMIX) +/- additives     PRN Meds:.chlorproMAZINE (THORAZINE) IV, HYDROmorphone (DILAUDID) injection, iohexol, ondansetron, phenol, sodium chloride, traMADol, DISCONTD: diphenhydrAMINE, DISCONTD: diphenhydrAMINE, DISCONTD: metoprolol, DISCONTD: naloxone, DISCONTD: oxyCODONE, DISCONTD: sodium chloride, DISCONTD: sodium chloride  Labs:  CMP     Component Value Date/Time   NA 135 03/13/2011 0607   K 3.9 03/13/2011 0607   CL 102 03/13/2011 0607   CO2 25 03/13/2011 0607   GLUCOSE 111* 03/13/2011 0607   BUN 9 03/13/2011 0607   CREATININE 0.79 03/13/2011 0607   CALCIUM 8.8 03/13/2011 0607   PROT 6.4 03/13/2011 0607   ALBUMIN 2.3* 03/13/2011 0607   AST 56* 03/13/2011 0607   ALT 129* 03/13/2011 0607   ALKPHOS 343* 03/13/2011 0607   BILITOT 1.1 03/13/2011 0607   GFRNONAA >90  03/13/2011 0607   GFRAA >90 03/13/2011 0607   Results for HASTING, Callen (MRN [id number]) as of 03/13/2011 09:58  Ref. Range 02/19/2011 11:10 02/22/2011 05:30 02/27/2011 03:25 03/06/2011 05:28  Prealbumin Latest Range: 17.0-34.0 mg/dL 62.1 (L) 30.8 5.0 (L) 65.7    Intake/Output Summary (Last 24 hours) at 03/13/11 0957 Last data filed at 03/13/11 8469  Gross per 24 hour  Intake   1380 ml  Output   1910 ml  Net   -530 ml    Weight Status:  175.9# (79.8 kg)- 3/7, BMI=24.6 currently UBW 189# prior to admission  Re-estimated needs:  (unchanged) 2200-2400 kcals, 110-120 gm protein per day TPN meeting 100% estimated protein and kcal needs.  Nutrition Dx:  Inadequate oral intake, ongoing  Goal:  TPN to meet >90% estimated protein needs, maximize energy provision as able during national lipid backorder-- currently met. Revised Goal:  Transition to oral diet with weaning of TPN   Intervention:   1.   Continue TPN per Pharmacy until oral intake tolerated and improved 2.  Begin [employer] tid  Monitor:  TPN tolerance, glycemic control, labs, weight trend, diet advancement.   Craig Garrett Pager #:  [phone]

## 2011-03-13 NOTE — Progress Notes (Signed)
Clinical Social Worker continuing to follow for support as needed.  CSW connected patient with financial counselor in regards to [facility] Application and patient has a phone interview scheduled tomorrow to complete Disability paperwork.   CSW provided patient with contact number for Disability to address his current hospital stay.  CSW remains available for support and assistance as needed.  [address] [place], [place] [phone]

## 2011-03-14 ENCOUNTER — Other Ambulatory Visit: Payer: Self-pay

## 2011-03-14 LAB — CBC
MCH: 28.6 pg (ref 26.0–34.0)
Platelets: 396 10*3/uL (ref 150–400)
RBC: 4.13 MIL/uL — ABNORMAL LOW (ref 4.22–5.81)

## 2011-03-14 LAB — BASIC METABOLIC PANEL
BUN: 9 mg/dL (ref 6–23)
Calcium: 8.9 mg/dL (ref 8.4–10.5)
Creatinine, Ser: 0.68 mg/dL (ref 0.50–1.35)
GFR calc Af Amer: >90 mL/min (ref >90)
GFR calc non Af Amer: >90 mL/min (ref >90)
Glucose, Bld: 123 mg/dL — ABNORMAL HIGH (ref 70–99)
Potassium: 3.7 mEq/L (ref 3.5–5.1)

## 2011-03-14 MED ORDER — METOPROLOL TARTRATE 12.5 MG HALF TABLET
12.5000 mg | ORAL_TABLET | Freq: Two times a day (BID) | ORAL | Status: DC
  Administered 2011-03-14 – 2011-03-27 (×27): 12.5 mg via ORAL
  Filled 2011-03-14 (×28): qty 1

## 2011-03-14 MED ORDER — LORAZEPAM 1 MG PO TABS
1.0000 mg | ORAL_TABLET | Freq: Once | ORAL | Status: AC
  Administered 2011-03-14: 1 mg via ORAL
  Filled 2011-03-14: qty 1

## 2011-03-14 MED ORDER — ADULT MULTIVITAMIN W/MINERALS CH
1.0000 | ORAL_TABLET | Freq: Every day | ORAL | Status: DC
  Administered 2011-03-15 – 2011-03-27 (×13): 1 via ORAL
  Filled 2011-03-14 (×14): qty 1

## 2011-03-14 MED ORDER — INSULIN REGULAR HUMAN 100 UNIT/ML IJ SOLN
INTRAMUSCULAR | Status: AC
  Administered 2011-03-14: 18:00:00 via INTRAVENOUS
  Filled 2011-03-14: qty 2280

## 2011-03-14 MED ORDER — VANCOMYCIN HCL 1000 MG IV SOLR
1250.0000 mg | Freq: Three times a day (TID) | INTRAVENOUS | Status: AC
  Administered 2011-03-14 – 2011-03-19 (×16): 1250 mg via INTRAVENOUS
  Filled 2011-03-14 (×19): qty 1250

## 2011-03-14 NOTE — Progress Notes (Signed)
Pt's pulse currently 132 at rest.  No complaints of pain, pt does not appear to be anxious now.  Prior pulse at 0140 was 122.  Notified MD on call, Dr. Donell Beers, who ordered an EKG and BMET.  Will continue to monitor.
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
## 2011-03-14 NOTE — Progress Notes (Signed)
Physical Therapy Treatment Patient Details Name: Craig Garrett MRN: [id number] DOB: 1960/06/21 Today's Date: 03/14/2011  PT Assessment/Plan  PT - Assessment/Plan Comments on Treatment Session: Patient without complaint of vertigo today.  Did not address as he was eager to walk increased distance today.  Was able to [facility] TDWB for most of walk today, but fatigues and complains of increase ankle pain with toes down only so cued for foot flat with no more than 25% weight on foot.  Also feel shoe on left foot will improve performance and encouraged him to have daughter bring. PT Plan: Discharge plan remains appropriate PT Frequency: Min 3X/week Follow Up Recommendations: Home health PT;Supervision - Intermittent Equipment Recommended: Rolling walker with 5" wheels PT Goals  Acute Rehab PT Goals Pt will go Sit to Stand: with modified independence PT Goal: Sit to Stand - Progress: Progressing toward goal Pt will go Stand to Sit: with modified independence PT Goal: Stand to Sit - Progress: Progressing toward goal Pt will Ambulate: >150 feet;with supervision;with least restrictive assistive device PT Goal: Ambulate - Progress: Progressing toward goal  PT Treatment Precautions/Restrictions  Precautions Precautions: Fall Required Braces or Orthoses: Yes Other Brace/Splint: right ankle air splint Restrictions Weight Bearing Restrictions: Yes RLE Weight Bearing: Touchdown weight bearing RLE Partial Weight Bearing Percentage or Pounds: TDWB Mobility (including Balance) Bed Mobility Bed Mobility: No (patient up in chair) Transfers Sit to Stand: 5: Supervision;With upper extremity assist;From chair/3-in-1;With armrests Sit to Stand Details (indicate cue type and reason): for safety with cues for TDWB right foot Stand to Sit: 5: Supervision;With upper extremity assist;With armrests;To chair/3-in-1 Stand to Sit Details: cues for safety with weight right foot Ambulation/Gait Ambulation/Gait  Assistance: 4: Min assist Ambulation/Gait Assistance Details (indicate cue type and reason): minguard assist, cues for  TDWB Ambulation Distance (Feet): 130 Feet Assistive device: Rolling walker Gait Pattern: Step-to pattern;Antalgic    Exercise  General Exercises - Lower Extremity Ankle Circles/Pumps: AROM;Both;5 reps;Seated Quad Sets: AROM;Both;5 reps;Seated Gluteal Sets: AROM;5 reps;Seated Straight Leg Raises: AROM;Both;5 reps;Supine End of Session PT - End of Session Equipment Utilized During Treatment: Gait belt;Right ankle foot orthosis Activity Tolerance: Patient limited by pain Patient left: in chair;with call bell in reach General Behavior During Session: [facility] for tasks performed Cognition: [facility] for tasks performed  [facility] 03/14/2011, 12:04 PM

## 2011-03-14 NOTE — Progress Notes (Addendum)
Patient ID: Craig Garrett, male   DOB: [birth year], 51 y.o.   MRN: [id number] 20 Days Post-Op  Subjective: Feeling better, no nausea now, tolerated some liquids    Objective: Vital signs in last 24 hours: Temp:  [97.7 F (36.5 C)-99.6 F (37.6 C)] 97.7 F (36.5 C) (03/12 0457) Pulse Rate:  [80-132] 132  (03/12 0457) Resp:  [15-19] 16  (03/12 0457) BP: (110-126)/(65-86) 112/74 mmHg (03/12 0457) SpO2:  [96 %-100 %] 96 % (03/12 0457) Last BM Date: 03/13/11  Intake/Output from previous day: 03/11 0701 - 03/12 0700 In: 3177.2 [P.O.:240; I.V.:646.9; IV Piggyback:250; TPN:2040.3] Out: 1250 [Urine:1200; Emesis/NG output:50] Intake/Output this shift:    General appearance: alert and cooperative Resp: clear to auscultation bilaterally Cardio: regular rate and rhythm GI: wound with less drainage, dressing changed, no significant abdominal tenderness HR 120's Lab Results: CBC   Basename 03/14/11 0530 03/13/11 0607  WBC 11.2* 10.2  HGB 11.8* 11.4*  HCT 35.3* 34.4*  PLT 396 405*   BMET  Basename 03/14/11 0530 03/13/11 0607  NA 133* 135  K 3.7 3.9  CL 102 102  CO2 24 25  GLUCOSE 123* 111*  BUN 9 9  CREATININE 0.68 0.79  CALCIUM 8.9 8.8   PT/INR No results found for this basename: LABPROT:2,INR:2 in the last 72 hours ABG No results found for this basename: PHART:2,PCO2:2,PO2:2,HCO3:2 in the last 72 hours  Studies/Results: Dg Chest 2 View  03/12/2011  *RADIOLOGY REPORT*  Clinical Data: Chest pain.  CHEST - 2 VIEW  Comparison: 03/04/2011.  Findings: The NG tube has been removed.  The right PICC line is stable.  The lungs are much better aerated with resolving edema atelectasis and effusions. A small left pleural effusion persist with overlying atelectasis.  IMPRESSION: Much improved lung aeration with resolving edema, atelectasis and effusions.  Original Report Authenticated By: P. Loralie Champagne, M.D.   Ct Abdomen Pelvis W Contrast  03/12/2011  *RADIOLOGY REPORT*  Clinical  Data: 51 year old male - follow-up abdominal and pelvic abscesses.  Right percutaneous drainage catheter in place. Multiple abdominal surgeries.  CT ABDOMEN AND PELVIS WITH CONTRAST  Technique:  Multidetector CT imaging of the abdomen and pelvis was performed following the standard protocol during bolus administration of intravenous contrast.  Contrast: OMNIPAQUE IOHEXOL 300 MG/ML IJ SOLN  Comparison: 03/03/2011  Findings: A small to moderate left pleural effusion and mild to moderate bibasilar atelectasis, left greater than right are again noted. A 1 x 1.2 cm hypoechoic lesion in the left liver (image 20) and a 0.8 x 1.4 cm low density lesion in the posterior right hepatic segment (image 12) are unchanged. The spleen, pancreas, gallbladder, adrenal glands and kidneys are unremarkable.  An anterior right pelvic percutaneous drain is identified with small residual fluid collection adjacent to the drain, now measures 0.7 x 6 cm, previously 2.8 x 10 cm. The abscess collections within the lower left abdomen and lower central pelvis have resolved. Stranding within the pelvis is unchanged. There is no evidence of free fluid, new abscess, pneumoperitoneum or bowel obstruction.  No acute or suspicious bony abnormalities are identified.  IMPRESSION: Significant reduction in size of the right pelvic abscess adjacent to the percutaneous drain.  Resolution of abscess collections within the lower left abdomen and lower central pelvis.  No other significant changes.  Stable small to moderate left pleural effusion and mild to moderate bibasilar atelectasis.  Original Report Authenticated By: Rosendo Gros, M.D.    Anti-infectives: Anti-infectives     Start  Dose/Rate Route Frequency Ordered Stop   03/06/11 1100   vancomycin (VANCOCIN) 1,250 mg in sodium chloride 0.9 % 250 mL IVPB        1,250 mg 166.7 mL/hr over 90 Minutes Intravenous Every 12 hours 03/06/11 1026     02/27/11 1400   piperacillin-tazobactam  (ZOSYN) IVPB 3.375 g  Status:  Discontinued        3.375 g 12.5 mL/hr over 240 Minutes Intravenous 3 times per day 02/27/11 1027 03/13/11 0811   02/26/11 1430   piperacillin-tazobactam (ZOSYN) IVPB 3.375 g  Status:  Discontinued        3.375 g 12.5 mL/hr over 240 Minutes Intravenous 4 times per day 02/26/11 1421 02/27/11 1027   02/15/11 1600   ertapenem (INVANZ) 1 g in sodium chloride 0.9 % 50 mL IVPB  Status:  Discontinued        1 g 100 mL/hr over 30 Minutes Intravenous Every 24 hours 02/14/11 1634 02/15/11 0813   02/14/11 1715   ertapenem (INVANZ) 1 g in sodium chloride 0.9 % 50 mL IVPB        1 g 100 mL/hr over 30 Minutes Intravenous To [facility] 02/14/11 1709 02/14/11 1753   02/14/11 1615   Ampicillin-Sulbactam (UNASYN) 3 g in sodium chloride 0.9 % 100 mL IVPB  Status:  Discontinued        3 g 100 mL/hr over 60 Minutes Intravenous Every 6 hours 02/14/11 1603 02/14/11 1634          Assessment/Plan: s/p Procedure(s): EXPLORATORY LAPAROTOMY PSBO s/p SB/colon resection/LOA- Wound continues to improve. Ileus -- bentyl added yesterday, feels good this AM, trying small amounts of clears at a time Right foot/ankle fx- Dr. Carola Frost  ABD abscess -- drain out ID -- Vanc, plan to D/C once purulent wound drainage stops Tachycardia -- metoprolol stopped yesterday but HR up so resume previous dose  FEN -- Continue TNA , protonix BID  VTE -- lovenox Dispo -- await PO intake  LOS: 28 days    Violeta Gelinas, MD, MPH, FACS Pager: [phone]  03/14/2011

## 2011-03-14 NOTE — Progress Notes (Signed)
Pt feeling very anxious, not able to sleep.  Notified MD on call, Dr. Donell Beers, who ordered Ativan 1mg  PO X 1 now.  Will continue to monitor.

## 2011-03-15 ENCOUNTER — Inpatient Hospital Stay (HOSPITAL_COMMUNITY): Payer: Medicaid Other

## 2011-03-15 LAB — GLUCOSE, CAPILLARY
Glucose-Capillary: 108 mg/dL — ABNORMAL HIGH (ref 70–99)
Glucose-Capillary: 119 mg/dL — ABNORMAL HIGH (ref 70–99)

## 2011-03-15 MED ORDER — PROCHLORPERAZINE EDISYLATE 5 MG/ML IJ SOLN
10.0000 mg | Freq: Four times a day (QID) | INTRAMUSCULAR | Status: DC | PRN
  Filled 2011-03-15: qty 2

## 2011-03-15 MED ORDER — PROMETHAZINE HCL 25 MG/ML IJ SOLN
12.5000 mg | INTRAMUSCULAR | Status: DC | PRN
  Administered 2011-03-17: 11:00:00 via INTRAVENOUS
  Administered 2011-03-19 – 2011-03-22 (×2): 12.5 mg via INTRAVENOUS
  Filled 2011-03-15 (×3): qty 1

## 2011-03-15 MED ORDER — FAT EMULSION 20 % IV EMUL
250.0000 mL | INTRAVENOUS | Status: AC
  Administered 2011-03-15: 250 mL via INTRAVENOUS
  Filled 2011-03-15: qty 250

## 2011-03-15 MED ORDER — TRACE MINERALS CR-CU-MN-SE-ZN 10-1000-500-60 MCG/ML IV SOLN
INTRAVENOUS | Status: AC
  Administered 2011-03-15: 17:00:00 via INTRAVENOUS
  Filled 2011-03-15: qty 2000

## 2011-03-15 MED ORDER — BACITRACIN ZINC 500 UNIT/GM EX OINT
TOPICAL_OINTMENT | Freq: Two times a day (BID) | CUTANEOUS | Status: DC
Start: 2011-03-15 — End: 2011-03-24
  Administered 2011-03-15 – 2011-03-23 (×14): via TOPICAL
  Filled 2011-03-15 (×2): qty 15

## 2011-03-15 MED ORDER — ONDANSETRON HCL 4 MG/2ML IJ SOLN
4.0000 mg | INTRAMUSCULAR | Status: DC
  Administered 2011-03-15 – 2011-03-23 (×42): 4 mg via INTRAVENOUS
  Filled 2011-03-15 (×43): qty 2

## 2011-03-15 NOTE — Progress Notes (Signed)
Patient discussed at the Long Length of Stay Abdiaziz Klahn Weeks 03/15/2011  

## 2011-03-15 NOTE — Progress Notes (Signed)
Discussed at hospital long LOS meeting.  

## 2011-03-15 NOTE — Progress Notes (Signed)
PARENTERAL NUTRITION CONSULT NOTE - FOLLOW UP  Pharmacy Consult for TPN Indication: Prolonged ileus  No Known Allergies  Patient Measurements: Height: 5\' 11"  (180.3 cm) Weight: 169 lb 4.8 oz (76.794 kg) IBW/kg (Calculated) : 75.3   Vital Signs: Temp: 98.2 F (36.8 C) (03/13 0617) BP: 111/61 mmHg (03/13 0617) Pulse Rate: 90  (03/13 0617) Intake/Output from previous day: 03/12 0701 - 03/13 0700 In: 1615 [P.O.:240; I.V.:180; IV Piggyback:250; TPN:945] Out: -  Intake/Output from this shift:    Labs:  [facility] 03/14/11 0530 03/13/11 0607  WBC 11.2* 10.2  HGB 11.8* 11.4*  HCT 35.3* 34.4*  PLT 396 405*  APTT -- --  INR -- --     Basename 03/14/11 0530 03/13/11 0607  NA 133* 135  K 3.7 3.9  CL 102 102  CO2 24 25  GLUCOSE 123* 111*  BUN 9 9  CREATININE 0.68 0.79  LABCREA -- --  CREAT24HRUR -- --  CALCIUM 8.9 8.8  MG -- 1.7  PHOS -- 3.6  PROT -- 6.4  ALBUMIN -- 2.3*  AST -- 56*  ALT -- 129*  ALKPHOS -- 343*  BILITOT -- 1.1  BILIDIR -- --  IBILI -- --  PREALBUMIN -- 37.0*  TRIG -- 78  CHOLHDL -- --  CHOL -- 86   Estimated Creatinine Clearance: 119 ml/min (by C-G formula based on Cr of 0.68).    Basename 03/15/11 0613 03/14/11 1815 03/14/11 0605  GLUCAP 108* 81 112*    Medications:  Scheduled:    . bacitracin   Topical BID  . dicyclomine  20 mg Oral TID AC & HS  . enoxaparin (LOVENOX) injection  40 mg Subcutaneous Q24H  . feeding supplement  1 Container Oral TID WC  . insulin aspart  0-9 Units Subcutaneous Q12H  . metoprolol tartrate  12.5 mg Oral BID  . mulitivitamin with minerals  1 tablet Oral Daily  . ondansetron (ZOFRAN) IV  4 mg Intravenous Q4H  . pantoprazole  40 mg Oral BID AC  . polyvinyl alcohol  1 drop Right Eye QID  . vancomycin  1,250 mg Intravenous Q8H  . DISCONTD: vancomycin  1,250 mg Intravenous Q12H    Insulin Requirements in the past 24 hours:  0 units  Current Nutrition:  Clear liquids  Assessment: 51yo male with  prolonged ileus, with continued nausea & vomiting requiring scheduled Zofran.  He is on CL, but minimal intake due to nausea.  1.  Lytes- no labs this AM 2.  Endo- cbg 111-118, requiring no SSI  Nutritional Goals:  Clinimix E5/20 at goal rate 44ml/hr with lipid supplementation MWF at 8ml/hr (due to national shortage). Provides an average of 2211 kcal and 114gm protein.  Plan:  1.  Continue current rate 2.  TPN labs AM  Ajdin Macke P 03/15/2011,10:32 AM

## 2011-03-15 NOTE — Progress Notes (Signed)
Patient ID: Craig Garrett, male   DOB: 07/19/1960, 51 y.o.   MRN: [id number]   LOS: 29 days   Subjective: Remains nauseated, had e/o emesis overnight. Pain is somewhat improved since starting bentyl.  Objective: Vital signs in last 24 hours: Temp:  [97.9 F (36.6 C)-98.8 F (37.1 C)] 98.2 F (36.8 C) (03/13 0617) Pulse Rate:  [60-127] 90  (03/13 0617) Resp:  [17-20] 18  (03/13 0617) BP: (109-117)/(61-72) 111/61 mmHg (03/13 0617) SpO2:  [95 %-100 %] 100 % (03/13 0617) Weight:  [76.794 kg (169 lb 4.8 oz)] 76.794 kg (169 lb 4.8 oz) (03/13 0617) Last BM Date: 03/13/11   General appearance: alert and no distress Resp: clear to auscultation bilaterally Cardio: regular rate and rhythm GI: Soft, wound granulating well. No purulence expressible today. At skin level except inferior 1-2cm. +BS.  Assessment/Plan: PSBO s/p SB/colon resection/LOA- Wound continues to improve. Will change wound care to abx ointment/dry dressing. Still unable to take clears secondary to nausea, anorexia. Will schedule zofran and add compazine for breakthrough. Right foot/ankle fx- Dr. Carola Frost  ABD abscess -- Resolved (or nearly so). ID -- Vanc D#10/14. Afebrile. Tachycardia -- Improved back on metoprolol.  L pleural effusion -- Will get CXR to evaluate FEN -- Continue TNA , Added protonix BID  VTE -- Lovenox, SCD's Dispo -- Nausea    [name], PA-C Pager: [phone] General Trauma PA Pager: [phone]   03/15/2011

## 2011-03-15 NOTE — Progress Notes (Signed)
Patient still getting frequent nausea and vomiting.  Will agree to scheduled Zofran.  Marta Lamas. Gae Bon, MD, FACS [phone] Trauma Surgeon

## 2011-03-16 LAB — COMPREHENSIVE METABOLIC PANEL
ALT: 110 U/L — ABNORMAL HIGH (ref 0–53)
Alkaline Phosphatase: 323 U/L — ABNORMAL HIGH (ref 39–117)
BUN: 9 mg/dL (ref 6–23)
CO2: 27 mEq/L (ref 19–32)
Chloride: 100 mEq/L (ref 96–112)
GFR calc Af Amer: >90 mL/min (ref >90)
Glucose, Bld: 104 mg/dL — ABNORMAL HIGH (ref 70–99)
Potassium: 4 mEq/L (ref 3.5–5.1)
Sodium: 131 mEq/L — ABNORMAL LOW (ref 135–145)
Total Bilirubin: 0.9 mg/dL (ref 0.3–1.2)
Total Protein: 6.3 g/dL (ref 6.0–8.3)

## 2011-03-16 LAB — CBC
HCT: 33.4 % — ABNORMAL LOW (ref 39.0–52.0)
Hemoglobin: 11.1 g/dL — ABNORMAL LOW (ref 13.0–17.0)
MCHC: 33.2 g/dL (ref 30.0–36.0)
RBC: 3.85 MIL/uL — ABNORMAL LOW (ref 4.22–5.81)
WBC: 8.6 10*3/uL (ref 4.0–10.5)

## 2011-03-16 LAB — MAGNESIUM: Magnesium: 1.6 mg/dL (ref 1.5–2.5)

## 2011-03-16 MED ORDER — INSULIN ASPART 100 UNIT/ML ~~LOC~~ SOLN: 0.0000 [IU] | SUBCUTANEOUS | Status: DC

## 2011-03-16 MED ORDER — CLINIMIX E/DEXTROSE (5/20) 5 % IV SOLN
INTRAVENOUS | Status: DC
  Filled 2011-03-16: qty 2000

## 2011-03-16 MED ORDER — CLINIMIX E/DEXTROSE (5/20) 5 % IV SOLN
INTRAVENOUS | Status: AC
  Administered 2011-03-16: 17:00:00 via INTRAVENOUS
  Filled 2011-03-16: qty 2280

## 2011-03-16 MED ORDER — INSULIN ASPART 100 UNIT/ML ~~LOC~~ SOLN
0.0000 [IU] | Freq: Two times a day (BID) | SUBCUTANEOUS | Status: DC
  Administered 2011-03-17 – 2011-03-21 (×3): 1 [IU] via SUBCUTANEOUS

## 2011-03-16 NOTE — Progress Notes (Signed)
Patient ID: Craig Garrett, male   DOB: 1960-04-12, 51 y.o.   MRN: [id number]   LOS: 30 days   Subjective: Doing good this am. Has had no N/V. Kept down an Ensure last night and this am. Minimal pain.  Objective: Vital signs in last 24 hours: Temp:  [97.8 F (36.6 C)-99.2 F (37.3 C)] 98 F (36.7 C) (03/14 0615) Pulse Rate:  [77-94] 82  (03/14 0615) Resp:  [18-20] 18  (03/14 0615) BP: (102-124)/(69-85) 102/69 mmHg (03/14 0615) SpO2:  [97 %-98 %] 98 % (03/14 0615) Last BM Date: 03/15/11  Lab Results:  CBC  Basename 03/16/11 0528 03/14/11 0530  WBC 8.6 11.2*  HGB 11.1* 11.8*  HCT 33.4* 35.3*  PLT 318 396   BMET  Basename 03/16/11 0528 03/14/11 0530  NA 131* 133*  K 4.0 3.7  CL 100 102  CO2 27 24  GLUCOSE 104* 123*  BUN 9 9  CREATININE 0.72 0.68  CALCIUM 8.7 8.9    General appearance: alert and no distress Resp: clear to auscultation bilaterally Cardio: regular rate and rhythm GI: Soft, +BS, wound granulating well.  Assessment/Plan: PSBO s/p SB/colon resection/LOA- Nausea significantly improved with scheduled zofran. I told patient his goal today would be 6 Ensures (1500 Kcal). If he can do that we can d/c TPN and send home. Right foot/ankle fx- Dr. Carola Frost to follow up. ABD abscess -- Resolved (or nearly so).  ID -- Vanc D#11/14. Afebrile. WBC WNL. Tachycardia -- Improved back on metoprolol.  L pleural effusion -- Stable FEN -- Continue TPN VTE -- Lovenox, SCD's  Dispo -- PO's    [name], PA-C Pager: [phone] General Trauma PA Pager: [phone]   03/16/2011

## 2011-03-16 NOTE — Progress Notes (Signed)
Doing better by report.  Will slowly advance diet.  This patient has been seen and I agree with the findings and treatment plan.  Marta Lamas. Gae Bon, MD, FACS [phone] (pager) [phone] (direct pager) Trauma Surgeon
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
## 2011-03-16 NOTE — Progress Notes (Signed)
Physical Therapy Treatment Patient Details Name: Craig Garrett MRN: [id number] DOB: 1960/08/04 Today's Date: 03/16/2011  PT Assessment/Plan  PT - Assessment/Plan Comments on Treatment Session: Pt able to ambulate very well now that he is WBAT on RLE. Educated patient to ambulate with staff. Will signoff from acute PT.  PT Plan: All goals met and education completed, patient dischaged from PT services Follow Up Recommendations: No PT follow up Equipment Recommended: None recommended by PT PT Goals  Acute Rehab PT Goals PT Goal: Supine/Side to Sit - Progress: Met PT Goal: Sit to Supine/Side - Progress: Met PT Goal: Sit to Stand - Progress: Met PT Goal: Stand to Sit - Progress: Met PT Transfer Goal: Bed to Chair/Chair to Bed - Progress: Met PT Goal: Ambulate - Progress: Met PT Goal: Up/Down Stairs - Progress: Met PT Goal: Propel Wheelchair - Progress: Discontinued (comment)  PT Treatment Precautions/Restrictions  Precautions Precautions: Fall Precaution Comments: + vertigo symptoms Required Braces or Orthoses: No Other Brace/Splint: right ankle air splint Restrictions Weight Bearing Restrictions: Yes RLE Weight Bearing: Weight bearing as tolerated RLE Partial Weight Bearing Percentage or Pounds: TDWB Mobility (including Balance) Bed Mobility Supine to Sit: 7: Independent Transfers Sit to Stand: 7: Independent Stand to Sit: 7: Independent Ambulation/Gait Ambulation/Gait Assistance: 6: Modified independent (Device/Increase time) Ambulation Distance (Feet): 500 Feet Assistive device: None Gait Pattern: Within Functional Limits Stairs Assistance: 5: Supervision Stairs Assistance Details (indicate cue type and reason): cues for technique Stair Management Technique: Two rails Number of Stairs: 6     Exercise    End of Session PT - End of Session Equipment Utilized During Treatment: Gait belt Activity Tolerance: Patient tolerated treatment well Patient left: in chair Nurse  Communication: Mobility status for transfers;Mobility status for ambulation General Behavior During Session: [facility] for tasks performed Cognition: [facility] for tasks performed  Kelee Cunningham, [name] 03/16/2011, 2:51 PM 03/16/2011 Fredrich Birks PTA [phone] pager [phone] office

## 2011-03-16 NOTE — Progress Notes (Signed)
PARENTERAL NUTRITION CONSULT NOTE - FOLLOW UP  Pharmacy Consult for TPN Indication:  Prolonged ileus  No Known Allergies  Patient Measurements: Height: 5\' 11"  (180.3 cm) Weight: 169 lb 4.8 oz (76.794 kg) IBW/kg (Calculated) : 75.3   Vital Signs: Temp: 98 F (36.7 C) (03/14 0615) Temp src: Oral (03/14 0615) BP: 102/69 mmHg (03/14 0615) Pulse Rate: 82  (03/14 0615) Intake/Output from previous day: 03/13 0701 - 03/14 0700 In: 1080 [P.O.:220; I.V.:860] Out: 850 [Urine:850] Intake/Output from this shift:    Labs:  [facility] 03/16/11 0528 03/14/11 0530  WBC 8.6 11.2*  HGB 11.1* 11.8*  HCT 33.4* 35.3*  PLT 318 396  APTT -- --  INR -- --     Basename 03/16/11 0528 03/14/11 0530  NA 131* 133*  K 4.0 3.7  CL 100 102  CO2 27 24  GLUCOSE 104* 123*  BUN 9 9  CREATININE 0.72 0.68  LABCREA -- --  CREAT24HRUR -- --  CALCIUM 8.7 8.9  MG 1.6 --  PHOS 4.1 --  PROT 6.3 --  ALBUMIN 2.3* --  AST 44* --  ALT 110* --  ALKPHOS 323* --  BILITOT 0.9 --  BILIDIR -- --  IBILI -- --  PREALBUMIN -- --  TRIG -- --  CHOLHDL -- --  CHOL -- --   Estimated Creatinine Clearance: 119 ml/min (by C-G formula based on Cr of 0.72).    Basename 03/16/11 0615 03/15/11 1759 03/15/11 0613  GLUCAP 109* 119* 108*    Medications:  Scheduled:    . bacitracin   Topical BID  . dicyclomine  20 mg Oral TID AC & HS  . enoxaparin (LOVENOX) injection  40 mg Subcutaneous Q24H  . feeding supplement  1 Container Oral TID WC  . insulin aspart  0-9 Units Subcutaneous Q12H  . metoprolol tartrate  12.5 mg Oral BID  . mulitivitamin with minerals  1 tablet Oral Daily  . ondansetron (ZOFRAN) IV  4 mg Intravenous Q4H  . pantoprazole  40 mg Oral BID AC  . polyvinyl alcohol  1 drop Right Eye QID  . vancomycin  1,250 mg Intravenous Q8H    Insulin Requirements in the past 24 hours:  109  Current Nutrition:  Clear liquid diet  Assessment: 51yo male with pSBO & prolonged ileus, doing better on  scheduled Zofran & Bentyl.  He has a goal of 6 Ensure daily to d/c TPN.    1.  Lytes- all wnl 2.  Endo- has been well controlled, now on q12 SSI  Nutritional Goals:  Clinimix E5/20 at goal rate 58ml/hr with lipid supplementation MWF at 27ml/hr (due to national shortage). Provides an average of 2211 kcal and 114gm protein.   Plan:  1.  Continue current TPN 2.  D/C SSI  Ellar Hakala P 03/16/2011,11:11 AM

## 2011-03-17 LAB — CLOSTRIDIUM DIFFICILE BY PCR: Toxigenic C. Difficile by PCR: NEGATIVE

## 2011-03-17 LAB — GLUCOSE, CAPILLARY: Glucose-Capillary: 117 mg/dL — ABNORMAL HIGH (ref 70–99)

## 2011-03-17 MED ORDER — FAT EMULSION 20 % IV EMUL
250.0000 mL | INTRAVENOUS | Status: AC
  Administered 2011-03-17: 250 mL via INTRAVENOUS
  Filled 2011-03-17: qty 250

## 2011-03-17 MED ORDER — CLINIMIX E/DEXTROSE (5/20) 5 % IV SOLN
INTRAVENOUS | Status: AC
  Administered 2011-03-17: 17:00:00 via INTRAVENOUS
  Filled 2011-03-17: qty 2280

## 2011-03-17 NOTE — Progress Notes (Signed)
Rn had conversation with patient about ambulating in the hallways today. Per MD orders to walk 4x daily. Pt said that his stomach hurts too bad. Rn asked patient if it was because he had no motivation. Pt agreed that he didn't want to walk for that reason as well. Rn asked patient if he was depressed and he said no. I had lengthy conversation with patient about the importance of ambulating and he still refuses.

## 2011-03-17 NOTE — Progress Notes (Signed)
Clinical Social Worker continuing to follow patient for emotional support due to extended hospital stay.  Patient with no further needs at discharge and was able to complete financial needs.  Patient remains in hospital at this time until he can tolerate a diet.  Clinical Social Worker will sign off for now as social work intervention is no longer needed. Please consult [place] again if new need arises.  [address] [place], [place] [phone]

## 2011-03-17 NOTE — Progress Notes (Signed)
Agree with DC from PT services.  [employer] PT [phone]

## 2011-03-17 NOTE — Progress Notes (Signed)
Patient ID: Craig Garrett, male   DOB: [birth year], 51 y.o.   MRN: [id number]   LOS: 31 days   Subjective: Feeling nauseated this morning. Drank 2 cans of Ensure but got full/nauseated. Diarrhea x2 yesterday.  Objective: Vital signs in last 24 hours: Temp:  [98 F (36.7 C)-99.3 F (37.4 C)] 99.3 F (37.4 C) (03/15 0510) Pulse Rate:  [97-98] 98  (03/15 0510) Resp:  [16] 16  (03/15 0510) BP: (101-122)/(67-81) 101/71 mmHg (03/15 0510) SpO2:  [96 %-98 %] 96 % (03/15 0510) Last BM Date: 03/17/11   General appearance: alert and mild distress Resp: clear to auscultation bilaterally Cardio: regular rate and rhythm GI: normal findings: bowel sounds normal and soft, non-tender. Wound granulating well.  Assessment/Plan: PSBO s/p SB/colon resection/LOA -- Encouraged continued trials of small amounts of liquids. Right foot/ankle fx- Now WBAT ABD abscess -- Resolved (or nearly so).  ID -- Vanc D#12/14. Afebrile. Will check C. Diff. (negative ~1 week ago but diarrhea resolved) Tachycardia -- Improved back on metoprolol.  L pleural effusion -- Stable  FEN -- Continue TPN  VTE -- Lovenox, SCD's  Dispo -- PO's    [name], PA-C Pager: [phone] General Trauma PA Pager: [phone]   03/17/2011

## 2011-03-17 NOTE — Progress Notes (Addendum)
PARENTERAL NUTRITION CONSULT NOTE - FOLLOW UP  Pharmacy Consult for TPN and vancomycin (day #12/14) Indication:  Prolonged ileus  No Known Allergies  Patient Measurements: Height: 5\' 11"  (180.3 cm) Weight: 169 lb 4.8 oz (76.794 kg) IBW/kg (Calculated) : 75.3   Vital Signs: Temp: 99.3 F (37.4 C) (03/15 0510) BP: 101/71 mmHg (03/15 0510) Pulse Rate: 98  (03/15 0510) Intake/Output from previous day: 03/14 0701 - 03/15 0700 In: 3782 [P.O.:480; I.V.:1024; IV Piggyback:254; TPN:2024] Out: 300 [Urine:300] Intake/Output from this shift:    Labs:  [facility] 03/16/11 0528  WBC 8.6  HGB 11.1*  HCT 33.4*  PLT 318  APTT --  INR --     Basename 03/16/11 0528  NA 131*  K 4.0  CL 100  CO2 27  GLUCOSE 104*  BUN 9  CREATININE 0.72  LABCREA --  CREAT24HRUR --  CALCIUM 8.7  MG 1.6  PHOS 4.1  PROT 6.3  ALBUMIN 2.3*  AST 44*  ALT 110*  ALKPHOS 323*  BILITOT 0.9  BILIDIR --  IBILI --  PREALBUMIN --  TRIG --  CHOLHDL --  CHOL --   Estimated Creatinine Clearance: 119 ml/min (by C-G formula based on Cr of 0.72).   Medications:  Scheduled:     . bacitracin   Topical BID  . dicyclomine  20 mg Oral TID AC & HS  . enoxaparin (LOVENOX) injection  40 mg Subcutaneous Q24H  . feeding supplement  1 Container Oral TID WC  . insulin aspart  0-9 Units Subcutaneous Q12H  . metoprolol tartrate  12.5 mg Oral BID  . mulitivitamin with minerals  1 tablet Oral Daily  . ondansetron (ZOFRAN) IV  4 mg Intravenous Q4H  . pantoprazole  40 mg Oral BID AC  . polyvinyl alcohol  1 drop Right Eye QID  . vancomycin  1,250 mg Intravenous Q8H    Basename 03/17/11 0608 03/16/11 1825 03/16/11 0615  GLUCAP 128* 74 109*   Insulin Requirements in the past 24 hours:  None.  Current Nutrition:  Clear liquid diet  Nutritional Goals:  Clinimix E5/20 at goal rate 66ml/hr with lipid supplementation MWF at 64ml/hr (due to national shortage). Provides an average of 2211 kcal and 114gm  protein.  Assessment: 51yo male with pSBO & prolonged ileus, doing better on scheduled Zofran & Bentyl.  He has a goal of 6 Ensure daily to d/c TPN.    No new labs today.  CBG at goal.  Plan:  1.  Continue current TPN 2.  F/u am bmp 3.  Continue same vancomycin     Craig Garrett, PharmD, BCPS Clinical Pharmacist Pager: [phone] 03/17/2011 1:40 PM

## 2011-03-17 NOTE — Progress Notes (Signed)
crampy pain has resolved now Check c-diff Patient examined and I agree with the assessment and plan  Violeta Gelinas, MD, MPH, FACS Pager: [phone]  03/17/2011 10:17 AM

## 2011-03-18 LAB — BASIC METABOLIC PANEL
CO2: 24 meq/L (ref 19–32)
Chloride: 99 meq/L (ref 96–112)
Glucose, Bld: 117 mg/dL — ABNORMAL HIGH (ref 70–99)
Potassium: 3.6 meq/L (ref 3.5–5.1)

## 2011-03-18 LAB — CBC
HCT: 34.3 % — ABNORMAL LOW (ref 39.0–52.0)
MCHC: 32.9 g/dL (ref 30.0–36.0)
MCV: 86.2 fL (ref 78.0–100.0)
RDW: 15.5 % (ref 11.5–15.5)

## 2011-03-18 LAB — GLUCOSE, CAPILLARY
Glucose-Capillary: 119 mg/dL — ABNORMAL HIGH (ref 70–99)
Glucose-Capillary: 103 mg/dL — ABNORMAL HIGH (ref 70–99)

## 2011-03-18 MED ORDER — INSULIN REGULAR HUMAN 100 UNIT/ML IJ SOLN
INTRAVENOUS | Status: AC
  Administered 2011-03-18: 19:00:00 via INTRAVENOUS
  Filled 2011-03-18: qty 2280

## 2011-03-18 MED ORDER — POTASSIUM CHLORIDE CRYS ER 20 MEQ PO TBCR
20.0000 meq | EXTENDED_RELEASE_TABLET | Freq: Two times a day (BID) | ORAL | Status: AC
  Administered 2011-03-18 (×2): 20 meq via ORAL
  Filled 2011-03-18 (×2): qty 1

## 2011-03-18 NOTE — Progress Notes (Signed)
Patient ID: Craig Garrett, male   DOB: [birth year], 52 y.o.   MRN: [id number]   LOS: 32 days   Subjective: Had a bad day yesterday but feels better today. Ate chicken broth this morning without problem.  Objective: Vital signs in last 24 hours: Temp:  [98.1 F (36.7 C)-98.7 F (37.1 C)] 98.1 F (36.7 C) (03/16 0615) Pulse Rate:  [96-101] 101  (03/16 0615) Resp:  [16-18] 16  (03/16 0615) BP: (116-120)/(73-83) 116/83 mmHg (03/16 0615) SpO2:  [98 %-99 %] 99 % (03/16 0615) Last BM Date: 03/17/11  Lab Results:  CBC  Basename 03/18/11 0500 03/16/11 0528  WBC 7.5 8.6  HGB 11.3* 11.1*  HCT 34.3* 33.4*  PLT 271 318   BMET  Basename 03/18/11 0500 03/16/11 0528  NA 132* 131*  K 3.6 4.0  CL 99 100  CO2 24 27  GLUCOSE 117* 104*  BUN 9 9  CREATININE 0.72 0.72  CALCIUM 8.6 8.7    General appearance: alert and no distress Resp: clear to auscultation bilaterally Cardio: regular rate and rhythm GI: Soft, +BS. Wound continues to improve.  Assessment/Plan: PSBO s/p SB/colon resection/LOA -- Encouraged continued trials of small amounts of liquids.  Right foot/ankle fx- Now WBAT  ABD abscess -- Resolved (or nearly so).  ID -- Vanc D#13/14. Afebrile.  Tachycardia -- Improved back on metoprolol.  L pleural effusion -- Stable  FEN -- Continue TPN  VTE -- Lovenox, SCD's  Dispo -- PO's    [name], PA-C Pager: [phone] General Trauma PA Pager: [phone]   03/18/2011

## 2011-03-18 NOTE — Progress Notes (Signed)
PARENTERAL NUTRITION CONSULT NOTE - FOLLOW UP  Pharmacy Consult for TPN & Vancomycin Indication: Prolonged ileus  No Known Allergies  Patient Measurements: Height: 5\' 11"  (180.3 cm) Weight: 169 lb 4.8 oz (76.794 kg) IBW/kg (Calculated) : 75.3   Vital Signs: Temp: 98.1 F (36.7 C) (03/16 0615) BP: 116/83 mmHg (03/16 0615) Pulse Rate: 101  (03/16 0615) Intake/Output from previous day: 03/15 0701 - 03/16 0700 In: 3679 [P.O.:120; I.V.:692; IV Piggyback:500; TPN:2367] Out: 805 [Urine:800; Stool:5] Intake/Output from this shift: Total I/O In: 2417 [P.O.:120; I.V.:532; IV Piggyback:250; TPN:1515] Out: 800 [Urine:800]  Labs:  [facility] 03/18/11 0500 03/16/11 0528  WBC 7.5 8.6  HGB 11.3* 11.1*  HCT 34.3* 33.4*  PLT 271 318  APTT -- --  INR -- --     Basename 03/18/11 0500 03/16/11 0528  NA 132* 131*  K 3.6 4.0  CL 99 100  CO2 24 27  GLUCOSE 117* 104*  BUN 9 9  CREATININE 0.72 0.72  LABCREA -- --  CREAT24HRUR -- --  CALCIUM 8.6 8.7  MG -- 1.6  PHOS -- 4.1  PROT -- 6.3  ALBUMIN -- 2.3*  AST -- 44*  ALT -- 110*  ALKPHOS -- 323*  BILITOT -- 0.9  BILIDIR -- --  IBILI -- --  PREALBUMIN -- --  TRIG -- --  CHOLHDL -- --  CHOL -- --   Estimated Creatinine Clearance: 119 ml/min (by C-G formula based on Cr of 0.72).    Basename 03/18/11 0559 03/17/11 1936 03/17/11 0608  GLUCAP 119* 117* 128*    Medications:  Scheduled:    . bacitracin   Topical BID  . dicyclomine  20 mg Oral TID AC & HS  . enoxaparin (LOVENOX) injection  40 mg Subcutaneous Q24H  . feeding supplement  1 Container Oral TID WC  . insulin aspart  0-9 Units Subcutaneous Q12H  . metoprolol tartrate  12.5 mg Oral BID  . mulitivitamin with minerals  1 tablet Oral Daily  . ondansetron (ZOFRAN) IV  4 mg Intravenous Q4H  . pantoprazole  40 mg Oral BID AC  . polyvinyl alcohol  1 drop Right Eye QID  . vancomycin  1,250 mg Intravenous Q8H    Insulin Requirements in the past 24 hours:  1  unit  Current Nutrition:  Clear liquid diet.  Pt is receiving Resource TID, not Ensure.  He had 2 Resource yesterday.  As pt is on CL, Ensure is not currently an option  Assessment: 51yo male with pSBO & prolonged ileus, doing better on scheduled Zofran & Bentyl.    1.  Lytes- K 3.6, Na 132. 2.  Endo- cbg being checked twice daily, and well controlled, receiving 1 unit SSI yesterday. 3.  ID- pt remains afebrile and is on day #13 of 14 Vancomycin.  No further levels required.  Nutritional Goals:  Clinimix E5/20 at goal rate 17ml/hr with lipid supplementation MWF at 26ml/hr (due to national shortage). Provides an average of 2211 kcal and 114gm protein.   Plan:  1.  KCl po x 2 today 2.  Continue current TPN 3.  Please order stop date for Vancomycin.  Jaysha Lasure P 03/18/2011,6:54 AM

## 2011-03-18 NOTE — Progress Notes (Signed)
I have seen and examined the patient and agree with the assessment and plans.  Shamyah Stantz A. Cherisse Carrell  MD, FACS  

## 2011-03-19 LAB — GLUCOSE, CAPILLARY: Glucose-Capillary: 107 mg/dL — ABNORMAL HIGH (ref 70–99)

## 2011-03-19 MED ORDER — CLINIMIX E/DEXTROSE (5/20) 5 % IV SOLN
INTRAVENOUS | Status: DC
  Filled 2011-03-19: qty 2280

## 2011-03-19 MED ORDER — INSULIN REGULAR HUMAN 100 UNIT/ML IJ SOLN
INTRAVENOUS | Status: AC
  Administered 2011-03-19: 19:00:00 via INTRAVENOUS
  Filled 2011-03-19: qty 2280

## 2011-03-19 MED ORDER — ZOLPIDEM TARTRATE 5 MG PO TABS
5.0000 mg | ORAL_TABLET | Freq: Every evening | ORAL | Status: DC | PRN
  Administered 2011-03-19 – 2011-03-26 (×5): 5 mg via ORAL
  Filled 2011-03-19 (×5): qty 1

## 2011-03-19 NOTE — Progress Notes (Signed)
Has been able to take small amounts of clears today Diarrhea better so may try reglan tomorrow Patient examined and I agree with the assessment and plan  Violeta Gelinas, MD, MPH, FACS Pager: [phone]  03/19/2011 12:57 PM

## 2011-03-19 NOTE — Progress Notes (Signed)
Patient ID: Craig Garrett, male   DOB: August 25, 1960, 51 y.o.   MRN: [id number]   LOS: 33 days   Subjective: Nauseated now. Had nausea, emesis yesterday after 1/2 bowl of chicken broth. Continues with loose stools.   Objective: Vital signs in last 24 hours: Temp:  [98.2 F (36.8 C)-99.7 F (37.6 C)] 98.2 F (36.8 C) (03/17 0551) Pulse Rate:  [88-92] 88  (03/17 0551) Resp:  [18] 18  (03/17 0551) BP: (109-119)/(65-74) 109/65 mmHg (03/17 0551) SpO2:  [97 %-98 %] 98 % (03/17 0551) Last BM Date: 03/18/11   General appearance: alert and mild distress Resp: clear to auscultation bilaterally Cardio: regular rate and rhythm GI: normal findings: bowel sounds normal and soft, non-tender  Assessment/Plan: PSBO s/p SB/colon resection/LOA -- Reluctant to try motility agent given diarrhea, even though it's infrequent. Will try a more regimented approach and have patient take 30ml PO q1h to see if he can keep that down. If so, will increase to 60ml tomorrow. If that's unsuccessful I think a trial of a motility agent is warranted. Right foot/ankle fx- Now WBAT  ABD abscess -- Resolved (or nearly so).  ID -- Vanc D#14/14. Afebrile.  Tachycardia -- Improved back on metoprolol.  L pleural effusion -- Stable  FEN -- Continue TPN  VTE -- Lovenox, SCD's  Dispo -- PO's       [name], PA-C Pager: [phone] General Trauma PA Pager: [phone]   03/19/2011

## 2011-03-20 LAB — COMPREHENSIVE METABOLIC PANEL
Albumin: 2.4 g/dL — ABNORMAL LOW (ref 3.5–5.2)
Alkaline Phosphatase: 319 U/L — ABNORMAL HIGH (ref 39–117)
BUN: 10 mg/dL (ref 6–23)
Calcium: 8.6 mg/dL (ref 8.4–10.5)
Creatinine, Ser: 0.74 mg/dL (ref 0.50–1.35)
GFR calc Af Amer: >90 mL/min (ref >90)
Glucose, Bld: 97 mg/dL (ref 70–99)
Potassium: 3.9 mEq/L (ref 3.5–5.1)
Total Protein: 6.3 g/dL (ref 6.0–8.3)

## 2011-03-20 LAB — DIFFERENTIAL
Basophils Relative: 0 % (ref 0–1)
Lymphocytes Relative: 36 % (ref 12–46)
Lymphs Abs: 1.9 10*3/uL (ref 0.7–4.0)
Monocytes Absolute: 0.6 10*3/uL (ref 0.1–1.0)
Monocytes Relative: 11 % (ref 3–12)
Neutro Abs: 2.5 10*3/uL (ref 1.7–7.7)

## 2011-03-20 LAB — GLUCOSE, CAPILLARY: Glucose-Capillary: 120 mg/dL — ABNORMAL HIGH (ref 70–99)

## 2011-03-20 LAB — TRIGLYCERIDES: Triglycerides: 85 mg/dL (ref <150)

## 2011-03-20 LAB — PHOSPHORUS: Phosphorus: 4.3 mg/dL (ref 2.3–4.6)

## 2011-03-20 LAB — CBC
HCT: 32.6 % — ABNORMAL LOW (ref 39.0–52.0)
Hemoglobin: 10.6 g/dL — ABNORMAL LOW (ref 13.0–17.0)
MCHC: 32.5 g/dL (ref 30.0–36.0)

## 2011-03-20 LAB — CHOLESTEROL, TOTAL: Cholesterol: 82 mg/dL (ref 0–200)

## 2011-03-20 MED ORDER — FAT EMULSION 20 % IV EMUL
250.0000 mL | INTRAVENOUS | Status: AC
  Administered 2011-03-20: 250 mL via INTRAVENOUS
  Filled 2011-03-20 (×2): qty 250

## 2011-03-20 MED ORDER — TRACE MINERALS CR-CU-MN-SE-ZN 10-1000-500-60 MCG/ML IV SOLN
INTRAVENOUS | Status: AC
  Administered 2011-03-20: 18:00:00 via INTRAVENOUS
  Filled 2011-03-20: qty 2270

## 2011-03-20 MED ORDER — POTASSIUM CHLORIDE IN NACL 20-0.9 MEQ/L-% IV SOLN
INTRAVENOUS | Status: DC
  Administered 2011-03-20 – 2011-03-22 (×2): 20 mL/h via INTRAVENOUS
  Filled 2011-03-20 (×3): qty 1000

## 2011-03-20 NOTE — Progress Notes (Signed)
Nutrition Follow-up  Spoke with patient who denies nausea.  Tolerated clear liquids except for broth.  c/o N/V each time he drank it.  Continues to have diarrhea.  Patient with prolonged ileus.  Diet Order:  NPO, clear liquid diet cancelled 3/17,  Resource tid currently should be held  TPN:  Clinimix E 5/20 with 15 units of regular insulin at 95 ml/hr plus 20% lipids at 10 ml/hr, trace elements and multivitamin supplement MWF due to [employer].  TPN to transition to night time cyclic with plans to d/c pt on TPN if needed.  New TPN to cycle for 18 hours (Clinimix E 5/20, 2.276 L per day plus 20% lipids at 13 ml/hr x 18 hours/day MWF to provide an average of 2204 kcals, 114 grams protein per day.   Meds: Scheduled Meds:   . bacitracin   Topical BID  . dicyclomine  20 mg Oral TID AC & HS  . enoxaparin (LOVENOX) injection  40 mg Subcutaneous Q24H  . feeding supplement  1 Container Oral TID WC  . insulin aspart  0-9 Units Subcutaneous Q12H  . metoprolol tartrate  12.5 mg Oral BID  . mulitivitamin with minerals  1 tablet Oral Daily  . ondansetron (ZOFRAN) IV  4 mg Intravenous Q4H  . pantoprazole  40 mg Oral BID AC  . polyvinyl alcohol  1 drop Right Eye QID  . vancomycin  1,250 mg Intravenous Q8H   Continuous Infusions:   . 0.9 % NaCl with KCl 20 mEq / L    . fat emulsion    . TPN (CLINIMIX) +/- additives    . TPN (CLINIMIX) +/- additives 95 mL/hr at 03/18/11 1841  . TPN (CLINIMIX) +/- additives 95 mL/hr at 03/20/11 0600  . DISCONTD: 0.45 % NaCl with KCl 20 mEq / L 20 mL/hr (03/19/11 1610)  . DISCONTD: TPN (CLINIMIX) +/- additives     PRN Meds:.chlorproMAZINE (THORAZINE) IV, HYDROmorphone (DILAUDID) injection, phenol, promethazine, sodium chloride, traMADol, zolpidem  Labs:  CMP     Component Value Date/Time   NA 133* 03/20/2011 0535   K 3.9 03/20/2011 0535   CL 99 03/20/2011 0535   CO2 25 03/20/2011 0535   GLUCOSE 97 03/20/2011 0535   BUN 10 03/20/2011 0535   CREATININE 0.74  03/20/2011 0535   CALCIUM 8.6 03/20/2011 0535   PROT 6.3 03/20/2011 0535   ALBUMIN 2.4* 03/20/2011 0535   AST 42* 03/20/2011 0535   ALT 91* 03/20/2011 0535   ALKPHOS 319* 03/20/2011 0535   BILITOT 1.0 03/20/2011 0535   GFRNONAA >90 03/20/2011 0535   GFRAA >90 03/20/2011 0535     Intake/Output Summary (Last 24 hours) at 03/20/11 1116 Last data filed at 03/20/11 0600  Gross per 24 hour  Intake   2480 ml  Output    600 ml  Net   1880 ml    Weight Status:  Weight has continued to decrease.  UBW:  189# prior to admit Wt Readings from Last 3 Encounters:  03/15/11 169 lb 4.8 oz (76.794 kg)  03/15/11 169 lb 4.8 oz (76.794 kg)  02/07/11 185 lb 9.6 oz (84.188 kg)     Re-estimated needs:  2400-2500 kcals, 110-120 gm protein per day.    Nutrition Dx:  Inadequate oral intake related to altered GI function--ongoing  Goal:  TPN to meet >90% estimated needs, maximize energy provision as able during national lipid backorder--met. Revised Goal:  Transition to oral diet with weaning of TPN--not met.  Intervention:   1.  Cyclic TPN per Pharmacy x 18 hours per day with decrease to 12 hours per day if patient tolerates and possible d/c home with tpn 2.  Concerned with weight trending down discussed with Pharmacist who will work to maximize calories with tipn plus lipids.  Monitor:  TPN tolerance, glycemic control, labs, weight trend, diet advancement.   Craig Garrett Pager #:  [phone]

## 2011-03-20 NOTE — Progress Notes (Signed)
Patient is better, but we have been here before.  Will see how he does with imcrease om doet/  This patient has been seen and I agree with the findings and treatment plan.  Marta Lamas. Gae Bon, MD, FACS [phone] (pager) [phone] (direct pager) Trauma Surgeon

## 2011-03-20 NOTE — Progress Notes (Signed)
PARENTERAL NUTRITION CONSULT NOTE - FOLLOW UP  Pharmacy Consult for TPN Indication: Prolonged ileus  No Known Allergies  Patient Measurements: Height: 5\' 11"  (180.3 cm) Weight: 169 lb 4.8 oz (76.794 kg) IBW/kg (Calculated) : 75.3   Vital Signs: Temp: 97.9 F (36.6 C) (03/18 0603) BP: 105/69 mmHg (03/18 0603) Pulse Rate: 88  (03/18 0603) Intake/Output from previous day: 03/17 0701 - 03/18 0700 In: 2840 [P.O.:750; TPN:2090] Out: 600 [Urine:600] Intake/Output from this shift:    Labs:  Basename 03/20/11 0535 03/18/11 0500  WBC 5.2 7.5  HGB 10.6* 11.3*  HCT 32.6* 34.3*  PLT 205 271  APTT -- --  INR -- --     Basename 03/20/11 0535 03/18/11 0500  NA 133* 132*  K 3.9 3.6  CL 99 99  CO2 25 24  GLUCOSE 97 117*  BUN 10 9  CREATININE 0.74 0.72  LABCREA -- --  CREAT24HRUR -- --  CALCIUM 8.6 8.6  MG 1.6 --  PHOS 4.3 --  PROT 6.3 --  ALBUMIN 2.4* --  AST 42* --  ALT 91* --  ALKPHOS 319* --  BILITOT 1.0 --  BILIDIR -- --  IBILI -- --  PREALBUMIN -- --  TRIG 85 --  CHOLHDL -- --  CHOL 82 --   Estimated Creatinine Clearance: 119 ml/min (by C-G formula based on Cr of 0.74).    Basename 03/20/11 0600 03/19/11 1747 03/19/11 0548  GLUCAP 120* 88 107*    Medications:  Scheduled:     . bacitracin   Topical BID  . dicyclomine  20 mg Oral TID AC & HS  . enoxaparin (LOVENOX) injection  40 mg Subcutaneous Q24H  . feeding supplement  1 Container Oral TID WC  . insulin aspart  0-9 Units Subcutaneous Q12H  . metoprolol tartrate  12.5 mg Oral BID  . mulitivitamin with minerals  1 tablet Oral Daily  . ondansetron (ZOFRAN) IV  4 mg Intravenous Q4H  . pantoprazole  40 mg Oral BID AC  . polyvinyl alcohol  1 drop Right Eye QID  . vancomycin  1,250 mg Intravenous Q8H    Current Nutrition:  Clear liquid diet.  Pt is ordered Resource TID, not Ensure.  Assessment: 45 YOM with pSBO & prolonged ileus to continue on TPN.  Noted order to cycle TPN in preparation for  discharge.  Patient reports vomiting x 1 yesterday when he drank broth; no other PO intake.  He reports drinking coffee and eating jello this AM without issues. - Lytes: mild hyponatremia - will adjust IVF, others WNL - Renal: SCr stable, decent UOP, IVF at [facility] - Endo:  CBGs controlled with insulin in TPN - LFTs trending down, tbili / TC / TG WNL   Plan:  - Cycle Clinimix E 5/20 over 18 hours: 50 ml/hr x 1 hr, 136 ml/hr  16 hrs, then 50 ml/hr x 1 hr - Lipids at 13 ml/hr x 18 hrs on MWF only d/t shortage - Trace elements MWF d/t Sport and exercise psychologist - Patient receiving PO multivitamin - Continue with 15 units insulin in TPN - F/U tolerance to decrease cycle to 12 hours if possible - Continue to monitor PO intake   Craig Garrett, PharmD 03/20/2011,10:32 AM

## 2011-03-20 NOTE — Progress Notes (Signed)
Patient ID: Craig Garrett, male   DOB: 24-Feb-1960, 51 y.o.   MRN: [id number]   LOS: 34 days   Subjective: Did ok with the 86ml/h regimen yesterday. Had one e/o nausea but no emesis. Feeling ok this am.  Objective: Vital signs in last 24 hours: Temp:  [98 F (36.7 C)-98.1 F (36.7 C)] 98 F (36.7 C) (03/18 0540) Pulse Rate:  [88-100] 96  (03/18 0540) Resp:  [18-19] 18  (03/18 0540) BP: (101-139)/(67-99) 139/99 mmHg (03/18 0540) SpO2:  [95 %-100 %] 100 % (03/18 0540) Last BM Date: 03/19/11  Lab Results:  CBC  Basename 03/20/11 0535 03/18/11 0500  WBC 5.2 7.5  HGB 10.6* 11.3*  HCT 32.6* 34.3*  PLT 205 271   BMET  Basename 03/20/11 0535 03/18/11 0500  NA 133* 132*  K 3.9 3.6  CL 99 99  CO2 25 24  GLUCOSE 97 117*  BUN 10 9  CREATININE 0.74 0.72  CALCIUM 8.6 8.6    General appearance: alert and no distress Resp: CTA bilaterally Cardio: regular rate and rhythm GI: normal findings: bowel sounds normal and soft, non-tender  Assessment/Plan: PSBO s/p SB/colon resection/LOA -- Will increase to 62ml/h today.  Right foot/ankle fx- Now WBAT  ABD abscess -- Resolved (or nearly so).  Tachycardia -- Improved back on metoprolol.  L pleural effusion -- Stable  FEN -- Continue TPN  VTE -- Lovenox, SCD's  Dispo -- PO's. Will have pharmacy transition to nighttime TPN. Plan to d/c later this week, on TPN if necessary.    [name], PA-C Pager: [phone] General Trauma PA Pager: [phone]   03/20/2011

## 2011-03-21 MED ORDER — INSULIN ASPART 100 UNIT/ML ~~LOC~~ SOLN
0.0000 [IU] | Freq: Three times a day (TID) | SUBCUTANEOUS | Status: DC
  Administered 2011-03-22: 1 [IU] via SUBCUTANEOUS
  Administered 2011-03-22: 2 [IU] via SUBCUTANEOUS
  Administered 2011-03-22: 1 [IU] via SUBCUTANEOUS

## 2011-03-21 MED ORDER — CLINIMIX E/DEXTROSE (5/20) 5 % IV SOLN
INTRAVENOUS | Status: AC
  Administered 2011-03-21: 18:00:00 via INTRAVENOUS
  Filled 2011-03-21: qty 2270

## 2011-03-21 MED ORDER — INSULIN ASPART 100 UNIT/ML ~~LOC~~ SOLN: 0.0000 [IU] | SUBCUTANEOUS | Status: DC

## 2011-03-21 NOTE — Progress Notes (Signed)
PARENTERAL NUTRITION CONSULT NOTE - FOLLOW UP  Pharmacy Consult for TPN Indication: Prolonged ileus  No Known Allergies  Patient Measurements: Height: 5\' 11"  (180.3 cm) Weight: 171 lb 15.3 oz (78 kg) (patient weighed on stand up scales) IBW/kg (Calculated) : 75.3   Vital Signs: Temp: 98.9 F (37.2 C) (03/19 0634) Temp src: Oral (03/19 0634) BP: 116/75 mmHg (03/19 0634) Pulse Rate: 93  (03/19 0634) Intake/Output from previous day: 03/18 0701 - 03/19 0700 In: 3583.3 [P.O.:360; I.V.:485.3; JXB:1478] Out: -  Intake/Output from this shift:    Labs:  [facility] 03/20/11 0535  WBC 5.2  HGB 10.6*  HCT 32.6*  PLT 205  APTT --  INR --     Basename 03/20/11 0535  NA 133*  K 3.9  CL 99  CO2 25  GLUCOSE 97  BUN 10  CREATININE 0.74  LABCREA --  CREAT24HRUR --  CALCIUM 8.6  MG 1.6  PHOS 4.3  PROT 6.3  ALBUMIN 2.4*  AST 42*  ALT 91*  ALKPHOS 319*  BILITOT 1.0  BILIDIR --  IBILI --  PREALBUMIN 30.0  TRIG 85  CHOLHDL --  CHOL 82   Estimated Creatinine Clearance: 119 ml/min (by C-G formula based on Cr of 0.74).    Basename 03/21/11 0636 03/20/11 1708 03/20/11 1210  GLUCAP 137* 120* 115*    Medications:  Scheduled:     . bacitracin   Topical BID  . dicyclomine  20 mg Oral TID AC & HS  . enoxaparin (LOVENOX) injection  40 mg Subcutaneous Q24H  . feeding supplement  1 Container Oral TID WC  . insulin aspart  0-9 Units Subcutaneous Q12H  . metoprolol tartrate  12.5 mg Oral BID  . mulitivitamin with minerals  1 tablet Oral Daily  . ondansetron (ZOFRAN) IV  4 mg Intravenous Q4H  . pantoprazole  40 mg Oral BID AC  . polyvinyl alcohol  1 drop Right Eye QID    Current Nutrition:  TPN to provide needs  Assessment: 60 YOM with pSBO & prolonged ileus to continue on TPN.  Patient eating crackers and drinking juice. Noted RD concerned d/t weight decrease; however, patient's prealbumin is within normal range and is trending up, so will keep current regimen. -  Lytes: no labs today - Renal: SCr stable, decent UOP, IVF at [facility] - Endo:  CBGs controlled with insulin in TPN - LFTs trending down, tbili / TC / TG WNL   Plan:  - Cycle Clinimix E 5/20 over 12 hours: 50 ml/hr x 1 hr, 217 ml/hr  16 hrs, then 50 ml/hr x 1 hr - Lipids at 20 ml/hr x 12 hrs on MWF only d/t shortage - Trace elements MWF d/t Sport and exercise psychologist - Patient receiving PO multivitamin - Continue with 15 units insulin in TPN - Continue to monitor PO intake - F/U tolerance to 12-hr cycle   [name], PharmD 03/21/2011,9:05 AM

## 2011-03-21 NOTE — Progress Notes (Signed)
Patient ID: Craig Garrett, male   DOB: 1960-10-25, 51 y.o.   MRN: [id number] 27 Days Post-Op  Subjective: Tolerating 60cc/hr fluids, soft BM this AM  Objective: Vital signs in last 24 hours: Temp:  [98.8 F (37.1 C)-98.9 F (37.2 C)] 98.9 F (37.2 C) (03/19 0634) Pulse Rate:  [82-93] 93  (03/19 0634) Resp:  [16-18] 18  (03/19 0634) BP: (112-126)/(68-82) 116/75 mmHg (03/19 0634) SpO2:  [98 %-100 %] 100 % (03/19 0634) Weight:  [78 kg (171 lb 15.3 oz)] 78 kg (171 lb 15.3 oz) (03/19 0634) Last BM Date: 03/21/11  Intake/Output from previous day: 03/18 0701 - 03/19 0700 In: 3583.3 [P.O.:360; I.V.:485.3; WUJ:8119] Out: -  Intake/Output this shift:    General appearance: alert and cooperative Resp: clear to auscultation bilaterally GI: soft, wound with mild green drainage, NT  Lab Results: CBC   Basename 03/20/11 0535  WBC 5.2  HGB 10.6*  HCT 32.6*  PLT 205   BMET  Basename 03/20/11 0535  NA 133*  K 3.9  CL 99  CO2 25  GLUCOSE 97  BUN 10  CREATININE 0.74  CALCIUM 8.6   PT/INR No results found for this basename: LABPROT:2,INR:2 in the last 72 hours ABG No results found for this basename: PHART:2,PCO2:2,PO2:2,HCO3:2 in the last 72 hours  Studies/Results: No results found.  Anti-infectives: Anti-infectives     Start     Dose/Rate Route Frequency Ordered Stop   03/14/11 1900   vancomycin (VANCOCIN) 1,250 mg in sodium chloride 0.9 % 250 mL IVPB        1,250 mg 166.7 mL/hr over 90 Minutes Intravenous Every 8 hours 03/14/11 1134 03/19/11 2023   03/06/11 1100   vancomycin (VANCOCIN) 1,250 mg in sodium chloride 0.9 % 250 mL IVPB  Status:  Discontinued        1,250 mg 166.7 mL/hr over 90 Minutes Intravenous Every 12 hours 03/06/11 1026 03/14/11 1134   02/27/11 1400   piperacillin-tazobactam (ZOSYN) IVPB 3.375 g  Status:  Discontinued        3.375 g 12.5 mL/hr over 240 Minutes Intravenous 3 times per day 02/27/11 1027 03/13/11 0811   02/26/11 1430    piperacillin-tazobactam (ZOSYN) IVPB 3.375 g  Status:  Discontinued        3.375 g 12.5 mL/hr over 240 Minutes Intravenous 4 times per day 02/26/11 1421 02/27/11 1027   02/15/11 1600   ertapenem (INVANZ) 1 g in sodium chloride 0.9 % 50 mL IVPB  Status:  Discontinued        1 g 100 mL/hr over 30 Minutes Intravenous Every 24 hours 02/14/11 1634 02/15/11 0813   02/14/11 1715   ertapenem (INVANZ) 1 g in sodium chloride 0.9 % 50 mL IVPB        1 g 100 mL/hr over 30 Minutes Intravenous To [facility] 02/14/11 1709 02/14/11 1753   02/14/11 1615   Ampicillin-Sulbactam (UNASYN) 3 g in sodium chloride 0.9 % 100 mL IVPB  Status:  Discontinued        3 g 100 mL/hr over 60 Minutes Intravenous Every 6 hours 02/14/11 1603 02/14/11 1634          Assessment/Plan: s/p Procedure(s): EXPLORATORY LAPAROTOMY PSBO s/p SB/colon resection/LOA -- Will increase further as he tolerates today  Right foot/ankle fx- Now WBAT  Tachycardia -- Improved back on metoprolol.  FEN -- Continue TPN  VTE -- Lovenox, SCD's  Dispo -- PO's. Will have pharmacy transition to nighttime TPN. Plan to d/c later this week,  on TPN if necessary.  LOS: 35 days    Violeta Gelinas, MD, MPH, FACS Pager: [phone]  03/21/2011

## 2011-03-22 LAB — BASIC METABOLIC PANEL
BUN: 12 mg/dL (ref 6–23)
Chloride: 100 meq/L (ref 96–112)
Glucose, Bld: 85 mg/dL (ref 70–99)
Potassium: 3.8 meq/L (ref 3.5–5.1)

## 2011-03-22 LAB — GLUCOSE, CAPILLARY
Glucose-Capillary: 127 mg/dL — ABNORMAL HIGH (ref 70–99)
Glucose-Capillary: 152 mg/dL — ABNORMAL HIGH (ref 70–99)

## 2011-03-22 MED ORDER — INSULIN REGULAR HUMAN 100 UNIT/ML IJ SOLN
INTRAMUSCULAR | Status: AC
  Administered 2011-03-22: 18:00:00 via INTRAVENOUS
  Filled 2011-03-22: qty 2270

## 2011-03-22 MED ORDER — FAT EMULSION 20 % IV EMUL
250.0000 mL | INTRAVENOUS | Status: AC
  Administered 2011-03-22: 250 mL via INTRAVENOUS
  Filled 2011-03-22: qty 250

## 2011-03-22 NOTE — Progress Notes (Signed)
Pt discussed in long LOS meeting today. 

## 2011-03-22 NOTE — Progress Notes (Signed)
PARENTERAL NUTRITION CONSULT NOTE - FOLLOW UP  Pharmacy Consult for TPN Indication: Prolonged ileus  No Known Allergies  Patient Measurements: Height: 5\' 11"  (180.3 cm) Weight: 171 lb 15.3 oz (78 kg) (patient weighed on stand up scales) IBW/kg (Calculated) : 75.3   Vital Signs: Temp: 98.6 F (37 C) (03/20 0527) BP: 116/81 mmHg (03/20 0527) Pulse Rate: 95  (03/20 0527) Intake/Output from previous day: 03/19 0701 - 03/20 0700 In: 2104.9 [P.O.:50; I.V.:564.8; IV Piggyback:4; TPN:1486] Out: -  Intake/Output from this shift:    Labs:  [facility] 03/20/11 0535  WBC 5.2  HGB 10.6*  HCT 32.6*  PLT 205  APTT --  INR --     Basename 03/22/11 0535 03/20/11 0535  NA 133* 133*  K 3.8 3.9  CL 100 99  CO2 23 25  GLUCOSE 85 97  BUN 12 10  CREATININE 0.65 0.74  LABCREA -- --  CREAT24HRUR -- --  CALCIUM 8.7 8.6  MG -- 1.6  PHOS -- 4.3  PROT -- 6.3  ALBUMIN -- 2.4*  AST -- 42*  ALT -- 91*  ALKPHOS -- 319*  BILITOT -- 1.0  BILIDIR -- --  IBILI -- --  PREALBUMIN -- 30.0  TRIG -- 85  CHOLHDL -- --  CHOL -- 82   Estimated Creatinine Clearance: 119 ml/min (by C-G formula based on Cr of 0.65).    Basename 03/22/11 0808 03/22/11 0040 03/21/11 1653  GLUCAP 141* 127* 86    Medications:  Scheduled:     . bacitracin   Topical BID  . dicyclomine  20 mg Oral TID AC & HS  . enoxaparin (LOVENOX) injection  40 mg Subcutaneous Q24H  . feeding supplement  1 Container Oral TID WC  . insulin aspart  0-9 Units Subcutaneous Q8H  . metoprolol tartrate  12.5 mg Oral BID  . mulitivitamin with minerals  1 tablet Oral Daily  . ondansetron (ZOFRAN) IV  4 mg Intravenous Q4H  . pantoprazole  40 mg Oral BID AC  . polyvinyl alcohol  1 drop Right Eye QID  . DISCONTD: insulin aspart  0-9 Units Subcutaneous Q12H  . DISCONTD: insulin aspart  0-9 Units Subcutaneous Custom    Nutrition Goals: 2400-2500 kcal, 110-120 gm protein   Assessment: 50 YOM with pSBO & prolonged ileus to  continue on TPN.  Patient reports eating crackers and drinking juice.  He also stated that his nausea x 2 were associated with movement/standing up.  TPN rate was not increased to the correct rate (was at 137 ml/hr instead of 217 ml/hr), so difficult to assess tolerance based on CBGs, lytes. - Lytes: persistent hyponatremia, unable to correct with premade Clinimix - Renal: SCr stable, inaccurate I/O's, IVF at [facility] - Endo:  CBGs controlled with insulin in TPN - LFTs trending down, tbili / TC / TG WNL   Plan:  - Cycle Clinimix E 5/20 over 12 hours: 50 ml/hr x 1 hr, 217 ml/hr  16 hrs, then 50 ml/hr x 1 hr - Lipids at 20 ml/hr x 12 hrs on MWF only d/t shortage - Trace elements MWF d/t Sport and exercise psychologist - Patient receiving PO multivitamin - Continue with 15 units insulin in TPN - Continue to monitor PO intake - F/U tolerance to 12-hr cycle (CBGs, fluid status)   [name], PharmD 03/22/2011,9:06 AM

## 2011-03-22 NOTE — Progress Notes (Signed)
28 Days Post-Op  Subjective: Some emesis with broth otherwise he is tolerating everything else, six semi-formed bms yesterday  Objective: Vital signs in last 24 hours: Temp:  [97.9 F (36.6 C)-98.7 F (37.1 C)] 98.6 F (37 C) (03/20 0527) Pulse Rate:  [77-95] 95  (03/20 0527) Resp:  [16-18] 18  (03/20 0527) BP: (111-122)/(69-81) 116/81 mmHg (03/20 0527) SpO2:  [97 %-99 %] 98 % (03/20 0527) Last BM Date: 03/21/11  Intake/Output from previous day: 03/19 0701 - 03/20 0700 In: 2104.9 [P.O.:50; I.V.:564.8; IV Piggyback:4; TPN:1486] Out: -  Intake/Output this shift:    General appearance: no distress Resp: clear to auscultation bilaterally Cardio: regular rate and rhythm GI: wound clean, bs present, soft  Lab Results:   [facility] 03/20/11 0535  WBC 5.2  HGB 10.6*  HCT 32.6*  PLT 205   BMET  Basename 03/22/11 0535 03/20/11 0535  NA 133* 133*  K 3.8 3.9  CL 100 99  CO2 23 25  GLUCOSE 85 97  BUN 12 10  CREATININE 0.65 0.74  CALCIUM 8.7 8.6    Assessment/Plan: PSBO s/p SB/colon resection/LOA -- will advance to fulls, he is tol clears except for broth, I will let him eat and regulate himself at this point Right foot/ankle fx- Now WBAT  FEN -- Continue TPN at night, will wean off when tol po well enough VTE -- Lovenox, SCD's  Dispo -- possibly home later this week   LOS: 36 days    [facility] 03/22/2011

## 2011-03-23 DIAGNOSIS — R000 Tachycardia, unspecified: Secondary | ICD-10-CM

## 2011-03-23 DIAGNOSIS — D72829 Elevated white blood cell count, unspecified: Secondary | ICD-10-CM

## 2011-03-23 LAB — COMPREHENSIVE METABOLIC PANEL
BUN: 13 mg/dL (ref 6–23)
CO2: 25 mEq/L (ref 19–32)
Calcium: 8.6 mg/dL (ref 8.4–10.5)
Chloride: 101 mEq/L (ref 96–112)
Creatinine, Ser: 0.65 mg/dL (ref 0.50–1.35)
GFR calc Af Amer: >90 mL/min (ref >90)
GFR calc non Af Amer: >90 mL/min (ref >90)
Glucose, Bld: 132 mg/dL — ABNORMAL HIGH (ref 70–99)
Total Bilirubin: 0.8 mg/dL (ref 0.3–1.2)

## 2011-03-23 LAB — CBC
HCT: 34.8 % — ABNORMAL LOW (ref 39.0–52.0)
MCHC: 33 g/dL (ref 30.0–36.0)
Platelets: 243 10*3/uL (ref 150–400)
RDW: 15.7 % — ABNORMAL HIGH (ref 11.5–15.5)
WBC: 8.5 10*3/uL (ref 4.0–10.5)

## 2011-03-23 LAB — GLUCOSE, CAPILLARY
Glucose-Capillary: 106 mg/dL — ABNORMAL HIGH (ref 70–99)
Glucose-Capillary: 86 mg/dL (ref 70–99)
Glucose-Capillary: 118 mg/dL — ABNORMAL HIGH (ref 70–99)

## 2011-03-23 LAB — PHOSPHORUS: Phosphorus: 3.3 mg/dL (ref 2.3–4.6)

## 2011-03-23 MED ORDER — INSULIN ASPART 100 UNIT/ML ~~LOC~~ SOLN: 0.0000 [IU] | Freq: Three times a day (TID) | SUBCUTANEOUS | Status: DC

## 2011-03-23 MED ORDER — ONDANSETRON HCL 4 MG PO TABS
4.0000 mg | ORAL_TABLET | Freq: Four times a day (QID) | ORAL | Status: DC
  Administered 2011-03-23 – 2011-03-27 (×17): 4 mg via ORAL
  Filled 2011-03-23 (×27): qty 1

## 2011-03-23 NOTE — Progress Notes (Signed)
Ate eggs and bacon, will see how it goes Patient examined and I agree with the assessment and plan  Violeta Gelinas, MD, MPH, FACS Pager: [phone]  03/23/2011 10:00 AM

## 2011-03-23 NOTE — Progress Notes (Signed)
Patient ID: Craig Garrett, male   DOB: 02-08-1960, 51 y.o.   MRN: [id number]   LOS: 37 days   Subjective: Did pretty well with diet yesterday. One e/o emesis.  Objective: Vital signs in last 24 hours: Temp:  [97.6 F (36.4 C)-99.1 F (37.3 C)] 97.6 F (36.4 C) (03/21 0712) Pulse Rate:  [102-105] 105  (03/21 0712) Resp:  [18] 18  (03/21 0712) BP: (115-118)/(75-81) 115/75 mmHg (03/21 0712) SpO2:  [97 %-98 %] 97 % (03/21 0712) Last BM Date: 03/22/11  Lab Results:  CBC  Basename 03/23/11 0548  WBC 8.5  HGB 11.5*  HCT 34.8*  PLT 243   BMET  Basename 03/23/11 0548 03/22/11 0535  NA 135 133*  K 3.8 3.8  CL 101 100  CO2 25 23  GLUCOSE 132* 85  BUN 13 12  CREATININE 0.65 0.65  CALCIUM 8.6 8.7    General appearance: alert and no distress Resp: clear to auscultation bilaterally Cardio: regular rate and rhythm GI: Soft, NT, +BS. Wound granulating.  Assessment/Plan: PSBO s/p SB/colon resection/LOA -- Try regular diet today.  Right foot/ankle fx- Now WBAT  ABD abscess -- Resolved (or nearly so).  Tachycardia -- Improved back on metoprolol.  L pleural effusion -- Stable  FEN -- Continue TPN  VTE -- Lovenox, SCD's  Dispo -- Will keep strict track of intake today to make sure patient can stay hydrated at home. Hold TPN tonight. Reassess in am.    [name], PA-C Pager: [phone] General Trauma PA Pager: [phone]   03/23/2011

## 2011-03-23 NOTE — Progress Notes (Signed)
PARENTERAL NUTRITION CONSULT NOTE - FOLLOW UP  Pharmacy Consult for TPN Indication: Prolonged ileus  No Known Allergies  Patient Measurements: Height: 5\' 11"  (180.3 cm) Weight: 171 lb 15.3 oz (78 kg) (patient weighed on stand up scales) IBW/kg (Calculated) : 75.3   Vital Signs: Temp: 97.6 F (36.4 C) (03/21 0712) Temp src: Oral (03/21 0712) BP: 115/75 mmHg (03/21 0712) Pulse Rate: 105  (03/21 0712) Intake/Output from previous day: 03/20 0701 - 03/21 0700 In: 964.2 [I.V.:184.3; IV Piggyback:4; TPN:775.8] Out: -  Intake/Output from this shift:    Labs:  [facility] 03/23/11 0548  WBC 8.5  HGB 11.5*  HCT 34.8*  PLT 243  APTT --  INR --     Basename 03/23/11 0548 03/22/11 0535  NA 135 133*  K 3.8 3.8  CL 101 100  CO2 25 23  GLUCOSE 132* 85  BUN 13 12  CREATININE 0.65 0.65  LABCREA -- --  CREAT24HRUR -- --  CALCIUM 8.6 8.7  MG 1.6 --  PHOS 3.3 --  PROT 6.3 --  ALBUMIN 2.5* --  AST 47* --  ALT 111* --  ALKPHOS 339* --  BILITOT 0.8 --  BILIDIR -- --  IBILI -- --  PREALBUMIN -- --  TRIG -- --  CHOLHDL -- --  CHOL -- --   Estimated Creatinine Clearance: 119 ml/min (by C-G formula based on Cr of 0.65).    Basename 03/23/11 0814 03/22/11 2212 03/22/11 1709  GLUCAP 106* 152* 85    Medications:  Scheduled:     . bacitracin   Topical BID  . dicyclomine  20 mg Oral TID AC & HS  . enoxaparin (LOVENOX) injection  40 mg Subcutaneous Q24H  . feeding supplement  1 Container Oral TID WC  . insulin aspart  0-9 Units Subcutaneous Q8H  . metoprolol tartrate  12.5 mg Oral BID  . mulitivitamin with minerals  1 tablet Oral Daily  . ondansetron  4 mg Oral Q6H  . pantoprazole  40 mg Oral BID AC  . polyvinyl alcohol  1 drop Right Eye QID  . DISCONTD: ondansetron (ZOFRAN) IV  4 mg Intravenous Q4H    Nutrition Goals: 2400-2500 kcal, 110-120 gm protein   Assessment: 50 YOM with pSBO & prolonged ileus on TPN.  Per MD report, pt did well with diet yesterday, only  one emesis noted.  - Lytes: persistent hyponatremia, unable to correct with premade Clinimix - Renal: SCr stable, inaccurate I/O's, IVF at [facility] - Endo:  CBGs controlled with insulin in TPN - LFTs trending down, tbili / TC / TG WNL   Plan:  -Spoke with  [facility] - no TNA tonight to evaluate patient's po intake.  Will f/u am to determine future TNA needs.  Cyclic infusion finished for today so no tapering necessary.  Will change CBG / SSI to [facility] to match meals but likely will not need this off of tna.    Thanks,  Marchelle Folks. Illene Bolus, PharmD, BCPS Clinical Pharmacist Pager: [phone] 03/23/2011 9:01 AM

## 2011-03-24 DIAGNOSIS — R1310 Dysphagia, unspecified: Secondary | ICD-10-CM

## 2011-03-24 DIAGNOSIS — R112 Nausea with vomiting, unspecified: Secondary | ICD-10-CM

## 2011-03-24 LAB — GLUCOSE, CAPILLARY: Glucose-Capillary: 93 mg/dL (ref 70–99)

## 2011-03-24 MED ORDER — FAT EMULSION 20 % IV EMUL
250.0000 mL | INTRAVENOUS | Status: AC
  Administered 2011-03-24: 250 mL via INTRAVENOUS
  Filled 2011-03-24: qty 250

## 2011-03-24 MED ORDER — INSULIN ASPART 100 UNIT/ML ~~LOC~~ SOLN
0.0000 [IU] | Freq: Three times a day (TID) | SUBCUTANEOUS | Status: DC
  Administered 2011-03-25 – 2011-03-27 (×3): 2 [IU] via SUBCUTANEOUS

## 2011-03-24 MED ORDER — TRACE MINERALS CR-CU-MN-SE-ZN 10-1000-500-60 MCG/ML IV SOLN
INTRAVENOUS | Status: AC
  Administered 2011-03-24: 17:00:00 via INTRAVENOUS
  Filled 2011-03-24: qty 2000

## 2011-03-24 NOTE — Progress Notes (Signed)
PARENTERAL NUTRITION CONSULT NOTE - FOLLOW UP  Pharmacy Consult for TPN Indication: Prolonged ileus  No Known Allergies  Patient Measurements: Height: 5\' 11"  (180.3 cm) Weight: 169 lb 3.2 oz (76.749 kg) IBW/kg (Calculated) : 75.3   Vital Signs: Temp: 98.7 F (37.1 C) (03/22 0536) Temp src: Oral (03/22 0536) BP: 124/79 mmHg (03/22 0536) Pulse Rate: 93  (03/22 0536) Intake/Output from previous day: 03/21 0701 - 03/22 0700 In: 960 [P.O.:960] Out: 325 [Urine:325] Intake/Output from this shift: Total I/O In: 480 [P.O.:480] Out: 200 [Urine:200]  Labs:  [facility] 03/23/11 0548  WBC 8.5  HGB 11.5*  HCT 34.8*  PLT 243  APTT --  INR --     Basename 03/23/11 0548 03/22/11 0535  NA 135 133*  K 3.8 3.8  CL 101 100  CO2 25 23  GLUCOSE 132* 85  BUN 13 12  CREATININE 0.65 0.65  LABCREA -- --  CREAT24HRUR -- --  CALCIUM 8.6 8.7  MG 1.6 --  PHOS 3.3 --  PROT 6.3 --  ALBUMIN 2.5* --  AST 47* --  ALT 111* --  ALKPHOS 339* --  BILITOT 0.8 --  BILIDIR -- --  IBILI -- --  PREALBUMIN -- --  TRIG -- --  CHOLHDL -- --  CHOL -- --   Estimated Creatinine Clearance: 119 ml/min (by C-G formula based on Cr of 0.65).    Basename 03/24/11 0803 03/23/11 2217 03/23/11 1746  GLUCAP 90 118* 86    Medications:  Scheduled:     . bacitracin   Topical BID  . dicyclomine  20 mg Oral TID AC & HS  . enoxaparin (LOVENOX) injection  40 mg Subcutaneous Q24H  . feeding supplement  1 Container Oral TID WC  . insulin aspart  0-9 Units Subcutaneous TID AC  . metoprolol tartrate  12.5 mg Oral BID  . mulitivitamin with minerals  1 tablet Oral Daily  . ondansetron  4 mg Oral Q6H  . pantoprazole  40 mg Oral BID AC  . polyvinyl alcohol  1 drop Right Eye QID    Nutrition Goals: 2400-2500 kcal, 110-120 gm protein   Assessment: 50 YOM with pSBO & prolonged ileus to continue on TPN.  TPN was on hold yesterday to evaluate patient's tolerance to PO intake.  Patient reports eating 100%  of breakfast yesterday and was too full to eat lunch and dinner.  This morning, pt ate breakfast and then vomited.  Spoke to [facility], [place] - resume TPN today. - Lytes: no labs today - Renal: SCr stable, inaccurate I/O's, IVF at [facility] - Endo:  CBGs previously controlled with insulin in TPN - LFTs trending down, tbili / TC / TG WNL   Plan:  - Restart cyclic TPN: Clinimix E 5/20 over 12 hours: 50 ml/hr x 1 hr, 217 ml/hr x 10 hrs, then 50 ml/hr x 1 hr - Lipids at 20 ml/hr x 12 hrs on MWF only d/t shortage - Trace elements MWF d/t Sport and exercise psychologist - Patient receiving PO multivitamin - Continue with 15 units insulin in TPN - Continue to monitor PO intake - F/U with discharging plans    [name], PharmD 03/24/2011,11:04 AM
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
## 2011-03-24 NOTE — Progress Notes (Signed)
Patient ID: Craig Garrett, male   DOB: 06-Apr-1960, 51 y.o.   MRN: [id number]   LOS: 38 days   Subjective: Ate bacon, eggs, and potatoes for breakfast yesterday and kept it down but felt full the rest of the day. Did not eat lunch or dinner. This morning tried to eat sausage and eggs and threw it back up. Had continued with sporadic abdominal pain, nausea.  Objective: Vital signs in last 24 hours: Temp:  [98.4 F (36.9 C)-98.7 F (37.1 C)] 98.7 F (37.1 C) (03/22 0536) Pulse Rate:  [93-96] 93  (03/22 0536) Resp:  [16-18] 18  (03/22 0536) BP: (116-124)/(79-88) 124/79 mmHg (03/22 0536) SpO2:  [94 %-98 %] 98 % (03/22 0536) Weight:  [76.749 kg (169 lb 3.2 oz)] 76.749 kg (169 lb 3.2 oz) (03/22 1037) Last BM Date: 03/24/11   General appearance: alert and no distress  Assessment/Plan: PSBO s/p SB/colon resection/LOA -- With persistent c/o early satiety, N/V will ask GI to eval. Right foot/ankle fx- Now WBAT  ABD abscess -- Resolved (or nearly so).  Tachycardia -- Improved back on metoprolol.  L pleural effusion -- Stable  FEN -- Continue TPN  VTE -- Lovenox, SCD's  Dispo -- Restart TPN. Likely home with TPN to wait this out unless GI can shed some light.    [name], PA-C Pager: [phone] General Trauma PA Pager: [phone]   03/24/2011

## 2011-03-24 NOTE — Progress Notes (Signed)
Agree with above.  Wilmon Arms. Corliss Skains, MD, [facility]  03/24/2011 12:11 PM

## 2011-03-25 ENCOUNTER — Encounter (HOSPITAL_COMMUNITY): Payer: Self-pay | Admitting: *Deleted

## 2011-03-25 ENCOUNTER — Encounter (HOSPITAL_COMMUNITY): Admission: EM | Disposition: A | Discharge status: Home Health | Payer: Self-pay | Source: Home / Self Care

## 2011-03-25 HISTORY — PX: ESOPHAGOGASTRODUODENOSCOPY: SHX5428

## 2011-03-25 LAB — GLUCOSE, CAPILLARY
Glucose-Capillary: 111 mg/dL — ABNORMAL HIGH (ref 70–99)
Glucose-Capillary: 167 mg/dL — ABNORMAL HIGH (ref 70–99)

## 2011-03-25 SURGERY — EGD (ESOPHAGOGASTRODUODENOSCOPY)
Anesthesia: Moderate Sedation

## 2011-03-25 MED ORDER — DIPHENHYDRAMINE HCL 50 MG/ML IJ SOLN
INTRAMUSCULAR | Status: AC
  Filled 2011-03-25: qty 1

## 2011-03-25 MED ORDER — DIPHENHYDRAMINE HCL 50 MG/ML IJ SOLN
INTRAMUSCULAR | Status: DC | PRN
  Administered 2011-03-25: 25 mg via INTRAVENOUS

## 2011-03-25 MED ORDER — MIDAZOLAM HCL 10 MG/2ML IJ SOLN
INTRAMUSCULAR | Status: DC | PRN
  Administered 2011-03-25 (×3): 2 mg via INTRAVENOUS

## 2011-03-25 MED ORDER — PANTOPRAZOLE SODIUM 40 MG PO TBEC
40.0000 mg | DELAYED_RELEASE_TABLET | Freq: Every day | ORAL | Status: DC
  Administered 2011-03-27: 40 mg via ORAL
  Filled 2011-03-25: qty 1

## 2011-03-25 MED ORDER — LIDOCAINE VISCOUS 2 % MT SOLN
OROMUCOSAL | Status: AC
  Filled 2011-03-25: qty 15

## 2011-03-25 MED ORDER — INSULIN REGULAR HUMAN 100 UNIT/ML IJ SOLN
INTRAVENOUS | Status: AC
  Administered 2011-03-25: 18:00:00 via INTRAVENOUS
  Filled 2011-03-25: qty 2300

## 2011-03-25 MED ORDER — LIDOCAINE VISCOUS 2 % MT SOLN
OROMUCOSAL | Status: DC | PRN
  Administered 2011-03-25: 10 mL via OROMUCOSAL

## 2011-03-25 MED ORDER — FENTANYL CITRATE 0.05 MG/ML IJ SOLN
INTRAMUSCULAR | Status: AC
  Filled 2011-03-25: qty 4

## 2011-03-25 MED ORDER — MIDAZOLAM HCL 10 MG/2ML IJ SOLN
INTRAMUSCULAR | Status: AC
  Filled 2011-03-25: qty 4

## 2011-03-25 MED ORDER — FENTANYL NICU IV SYRINGE 50 MCG/ML
INJECTION | INTRAMUSCULAR | Status: DC | PRN
  Administered 2011-03-25 (×3): 25 ug via INTRAVENOUS

## 2011-03-25 MED ORDER — PROMETHAZINE HCL 25 MG/ML IJ SOLN
INTRAMUSCULAR | Status: AC
  Filled 2011-03-25: qty 1

## 2011-03-25 MED ORDER — SODIUM CHLORIDE 0.9 % IV SOLN
Freq: Once | INTRAVENOUS | Status: AC
  Administered 2011-03-25: 500 mL via INTRAVENOUS

## 2011-03-25 NOTE — Op Note (Signed)
[name] [facility] [address] [place], [place]  [address]  OPERATIVE PROCEDURE REPORT  PATIENT:  Craig Garrett, Craig Garrett  MR#:  [id number] BIRTHDATE:  Mar 20, 1960  GENDER:  male ENDOSCOPIST:  Dr. Lorenza Burton, MD ASSISTANT:  Rolanda Lundborg, technician, Bary Leriche and Judithann Sauger, RN  PROCEDURE DATE:  03/25/2011 PRE-PROCEDURE PREPERATION:  Patient fasted for 8 hours prior to the procedure. PRE-PROCEDURE PHYSICAL:  Patient has stable vital signs. Neck is supple. There is no JVD, thyromegaly or LAD. Chest clear to auscultation. S1 and S2 regular. Abdomen soft, non-distended, non-tender with NABS. PROCEDURE:  EGD w/brushings ASA CLASS:  Class III INDICATIONS:  1) GERD 2) Nausea and vomiting 3) Dysphagia. MEDICATIONS:  Benadryl 25 mg, Fentanyl 75 mcg & Versed 6 mg IV. TOPICAL ANESTHETIC:  Viscous xylocaine-10 cc PO.  DESCRIPTION OF PROCEDURE: After the risks benefits and alternatives of the procedure were thoroughly explained, informed consent was obtained. The Pentax Gastroscope [id number] was introduced through the mouth and advanced to the second portion of the duodenum, without limitations. The instrument was slowly withdrawn as the mucosa was fully examined. <<PROCEDUREIMAGES>>  The esophagus and the GEJ were widely patent with whitish exudates in the distal esophagus that were brushed to confirm the diagnosis of candidiasis. Patchy gastritis was noted. The rest of the stomach and the proximal small bowel appeared normal. There were no ulcers, erosions, masses or polyps noted. Retroflexed views revealed no abnormalities. The scope was then withdrawn from the patient and the procedure terminated. The patient tolerated the procedure without immediate complications.  IMPRESSION:  1) Patchy white exudates in distal esophagus-brushing done. 2) Patchy gastritis. 3) Otherwise normal EGD.  RECOMMENDATIONS:  1) Anti-reflux regimen to be followed. 2) Continue  PPI's. 3) Resume previous diet.  REPEAT EXAM:  None planned for now.  DISCHARGE INSTRUCTIONS: Standard instructions given.  ______________________________ Dr. Lorenza Burton, MD  CPT CODES: 95621  DIAGNOSIS CODES: 787.01, 530.81, 787.2  CC:  Canary Brim, M.D. Cherylynn Ridges, M.D.  n. eSIGNED:   Dr. Lorenza Burton at 03/25/2011 09:15 AM  [name], [phone]

## 2011-03-25 NOTE — Progress Notes (Signed)
RN called pharmacy regarding TPN running out. Was told to decrease rate of TPN to 50 ml/hr and then discontinue once bag is finished. RN has notified IV team about discontinuing TPN.

## 2011-03-25 NOTE — Progress Notes (Signed)
Patient ID: Craig Garrett, male   DOB: 1960-01-11, 51 y.o.   MRN: [id number]   LOS: 39 days   Subjective: Hungry since NPO for EGD.  Continues to have flatus/BM  Objective: Vital signs in last 24 hours: Temp:  [98.5 F (36.9 C)-99 F (37.2 C)] 99 F (37.2 C) (03/23 1326) Pulse Rate:  [101-124] 108  (03/23 1326) Resp:  [16-21] 18  (03/23 1326) BP: (118-138)/(76-108) 138/82 mmHg (03/23 1326) SpO2:  [96 %-100 %] 98 % (03/23 1326) Last BM Date: 03/24/11   General appearance: alert and no distress Abd soft, non tender, non distended.  Assessment/Plan: PSBO s/p SB/colon resection/LOA -- EGD with mild gastritis, no significant findings.   Right foot/ankle fx- Now WBAT  ABD abscess -- Resolved (or nearly so).  Tachycardia -- Improved back on metoprolol.  L pleural effusion -- Stable  FEN -- Continue TPN  VTE -- Lovenox, SCD's  Dispo -- Continue TNA until pt proves can tolerate adequate PO intake.  May require home on TNA.
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
## 2011-03-26 LAB — BASIC METABOLIC PANEL
CO2: 25 meq/L (ref 19–32)
Calcium: 8.5 mg/dL (ref 8.4–10.5)
Creatinine, Ser: 0.58 mg/dL (ref 0.50–1.35)
GFR calc non Af Amer: >90 mL/min (ref >90)
Glucose, Bld: 149 mg/dL — ABNORMAL HIGH (ref 70–99)

## 2011-03-26 LAB — GLUCOSE, CAPILLARY: Glucose-Capillary: 88 mg/dL (ref 70–99)

## 2011-03-26 MED ORDER — INSULIN REGULAR HUMAN 100 UNIT/ML IJ SOLN
INTRAVENOUS | Status: AC
  Administered 2011-03-26: 18:00:00 via INTRAVENOUS
  Filled 2011-03-26: qty 2300
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
## 2011-03-26 NOTE — Progress Notes (Signed)
PARENTERAL NUTRITION CONSULT NOTE - FOLLOW UP  Pharmacy Consult for TPN Indication: Prolonged ileus  No Known Allergies  Patient Measurements: Height: 5\' 11"  (180.3 cm) Weight: 169 lb 3.2 oz (76.749 kg) IBW/kg (Calculated) : 75.3   Vital Signs: Temp: 97.9 F (36.6 C) (03/24 0542) Temp src: Oral (03/23 2110) BP: 111/73 mmHg (03/24 0542) Pulse Rate: 107  (03/24 0542) Intake/Output from previous day: 03/23 0701 - 03/24 0700 In: 100 [I.V.:100] Out: -  Intake/Output from this shift:    Labs: No results found for this basename: WBC:3,HGB:3,HCT:3,PLT:3,APTT:3,INR:3 in the last 72 hours   Basename 03/26/11 0500  NA 131*  K 3.5  CL 99  CO2 25  GLUCOSE 149*  BUN 13  CREATININE 0.58  LABCREA --  CREAT24HRUR --  CALCIUM 8.5  MG --  PHOS --  PROT --  ALBUMIN --  AST --  ALT --  ALKPHOS --  BILITOT --  BILIDIR --  IBILI --  PREALBUMIN --  TRIG --  CHOLHDL --  CHOL --   Estimated Creatinine Clearance: 119 ml/min (by C-G formula based on Cr of 0.58).    Basename 03/26/11 0818 03/26/11 0034 03/25/11 1708  GLUCAP 116* 174* 95    Medications:  Scheduled:     . dicyclomine  20 mg Oral TID AC & HS  . insulin aspart  0-9 Units Subcutaneous Q8H  . metoprolol tartrate  12.5 mg Oral BID  . mulitivitamin with minerals  1 tablet Oral Daily  . ondansetron  4 mg Oral Q6H  . pantoprazole  40 mg Oral Q1200  . polyvinyl alcohol  1 drop Right Eye QID  . DISCONTD: pantoprazole  40 mg Oral BID AC    Glucose Requirements since 6pm last evening 2 units SSI with 15 units of insulin in TNA  Nutrition Goals: 2400-2500 kcal, 110-120 gm protein  Assessment: 50 YOM with pSBO & prolonged ileus to continue on TPN.  Patient failed with trial of po's earlier this week. - Lytes: Na low - chronically - Renal: SCr has been stable, inaccurate I/O's, IVF at [facility] - Endo:  CBGs with ok control with insulin in TPN - LFTs trending down, tbili / TC / TG WNL  Plan:  - Continue cyclic  TPN: Clinimix E 5/20 over 12 hours: 50 ml/hr x 1 hr, 217 ml/hr x 10 hrs, then 50 ml/hr x 1 hr. - Lipids at 20 ml/hr x 12 hrs on MWF only d/t shortage - Trace elements MWF d/t Sport and exercise psychologist - Patient receiving PO multivitamin - Continue with 15 units insulin in TPN - Continue to monitor PO intake, may need TNA at home - am TNA labs  Ayvin Lipinski L. Illene Bolus, PharmD, BCPS Clinical Pharmacist Pager: [phone] 03/26/2011 8:52 AM

## 2011-03-26 NOTE — Progress Notes (Signed)
1 Day Post-Op  Subjective: Having bm, still with nausea  Objective: Vital signs in last 24 hours: Temp:  [97.9 F (36.6 C)-99 F (37.2 C)] 97.9 F (36.6 C) (03/24 0542) Pulse Rate:  [107-108] 107  (03/24 0542) Resp:  [18] 18  (03/24 0542) BP: (111-138)/(73-82) 111/73 mmHg (03/24 0542) SpO2:  [95 %-98 %] 95 % (03/24 0542) Last BM Date: 03/24/11  Intake/Output from previous day: 03/23 0701 - 03/24 0700 In: 100 [I.V.:100] Out: -  Intake/Output this shift:    General appearance: no distress GI: soft, nontender nondistended bs present, wound clean    Basename 03/26/11 0500  NA 131*  K 3.5  CL 99  CO2 25  GLUCOSE 149*  BUN 13  CREATININE 0.58  CALCIUM 8.5     Assessment/Plan: PSBO s/p SB/colon resection/LOA -- continue eating as tolerated  Right foot/ankle fx- Now WBAT  FEN -- Continue TPN at night VTE -- Lovenox, SCD's  Dispo -- Continue TNA until pt proves can tolerate adequate PO intake. May require home on TNA.    LOS: 40 days    [facility] 03/26/2011

## 2011-03-27 ENCOUNTER — Emergency Department: Payer: Self-pay | Admitting: *Deleted

## 2011-03-27 ENCOUNTER — Encounter (HOSPITAL_COMMUNITY): Payer: Self-pay | Admitting: Gastroenterology

## 2011-03-27 LAB — COMPREHENSIVE METABOLIC PANEL
ALT: 138 U/L — ABNORMAL HIGH (ref 0–53)
AST: 61 U/L — ABNORMAL HIGH (ref 0–37)
Albumin: 2.6 g/dL — ABNORMAL LOW (ref 3.5–5.2)
Alkaline Phosphatase: 302 U/L — ABNORMAL HIGH (ref 39–117)
Chloride: 101 mEq/L (ref 96–112)
Potassium: 4.6 mEq/L (ref 3.5–5.1)
Sodium: 132 mEq/L — ABNORMAL LOW (ref 135–145)
Total Protein: 6.2 g/dL (ref 6.0–8.3)

## 2011-03-27 LAB — GLUCOSE, CAPILLARY
Glucose-Capillary: 174 mg/dL — ABNORMAL HIGH (ref 70–99)
Glucose-Capillary: 110 mg/dL — ABNORMAL HIGH (ref 70–99)

## 2011-03-27 LAB — MAGNESIUM: Magnesium: 1.7 mg/dL (ref 1.5–2.5)

## 2011-03-27 LAB — CBC
HCT: 36.7 % — ABNORMAL LOW (ref 39.0–52.0)
Hemoglobin: 12.1 g/dL — ABNORMAL LOW (ref 13.0–17.0)
MCH: 28.9 pg (ref 26.0–34.0)
MCV: 87.6 fL (ref 78.0–100.0)
RBC: 4.19 MIL/uL — ABNORMAL LOW (ref 4.22–5.81)

## 2011-03-27 LAB — DIFFERENTIAL
Eosinophils Absolute: 0.2 10*3/uL (ref 0.0–0.7)
Lymphs Abs: 2.4 10*3/uL (ref 0.7–4.0)
Monocytes Absolute: 1.2 10*3/uL — ABNORMAL HIGH (ref 0.1–1.0)
Monocytes Relative: 13 % — ABNORMAL HIGH (ref 3–12)
Neutrophils Relative %: 59 % (ref 43–77)

## 2011-03-27 LAB — CHOLESTEROL, TOTAL: Cholesterol: 108 mg/dL (ref 0–200)

## 2011-03-27 MED ORDER — HEPARIN SOD (PORK) LOCK FLUSH 100 UNIT/ML IV SOLN
250.0000 [IU] | INTRAVENOUS | Status: AC | PRN
  Administered 2011-03-27: 500 [IU]

## 2011-03-27 MED ORDER — ALTEPLASE 2 MG IJ SOLR
2.0000 mg | Freq: Once | INTRAMUSCULAR | Status: AC
  Administered 2011-03-27: 2 mg
  Filled 2011-03-27: qty 2

## 2011-03-27 MED ORDER — TRAMADOL HCL 50 MG PO TABS: 50.0000 mg | ORAL_TABLET | Freq: Four times a day (QID) | ORAL | Status: AC | PRN

## 2011-03-27 MED ORDER — DICYCLOMINE HCL 20 MG PO TABS: 20.0000 mg | ORAL_TABLET | Freq: Three times a day (TID) | ORAL | Status: AC

## 2011-03-27 MED ORDER — ONDANSETRON HCL 4 MG PO TABS: 4.0000 mg | ORAL_TABLET | Freq: Four times a day (QID) | ORAL | Status: AC

## 2011-03-27 MED ORDER — METOPROLOL TARTRATE 12.5 MG HALF TABLET: 12.5000 mg | ORAL_TABLET | Freq: Two times a day (BID) | ORAL | Status: DC

## 2011-03-27 MED ORDER — PANTOPRAZOLE SODIUM 40 MG PO TBEC: 40.0000 mg | DELAYED_RELEASE_TABLET | Freq: Every day | ORAL | Status: AC

## 2011-03-27 MED ORDER — CLINIMIX E/DEXTROSE (5/20) 5 % IV SOLN: INTRAVENOUS | Status: DC

## 2011-03-27 NOTE — Progress Notes (Signed)
Will see how things go at home Patient examined and I agree with the assessment and plan  Violeta Gelinas, MD, MPH, FACS Pager: [phone]  03/27/2011 9:22 AM

## 2011-03-27 NOTE — Progress Notes (Signed)
Nutrition Follow-up  Continues to have diarrhea  Diet Order:  Regular- complains of indigestion.  Still only taking very small amounts of diet (fruit, juice this am.  Tomasa Blase, eggs, and fruit yesturday).  With no vomiting.  TPN:  Pt has been receiving Clinimix E5/20 Cyclic over 12 hours which provided 2.27L daily and 20% lipids at 20 ml/hr MWF.  This regime had been providing 2409 kcals, 114 g protein.  Pt now for d/c on home tpn.  Meds: Scheduled Meds:   . alteplase  2 mg Intracatheter Once  . alteplase  2 mg Intracatheter Once  . dicyclomine  20 mg Oral TID AC & HS  . insulin aspart  0-9 Units Subcutaneous Q8H  . metoprolol tartrate  12.5 mg Oral BID  . mulitivitamin with minerals  1 tablet Oral Daily  . ondansetron  4 mg Oral Q6H  . pantoprazole  40 mg Oral Q1200  . polyvinyl alcohol  1 drop Right Eye QID   Continuous Infusions:   . TPN (CLINIMIX) +/- additives 217 mL/hr at 03/26/11 1747   PRN Meds:.chlorproMAZINE (THORAZINE) IV, diphenhydrAMINE, fentaNYL, HYDROmorphone (DILAUDID) injection, lidocaine, midazolam, phenol, promethazine, sodium chloride, traMADol, zolpidem  Labs:  CMP     Component Value Date/Time   NA 132* 03/27/2011 0645   K 4.6 03/27/2011 0645   CL 101 03/27/2011 0645   CO2 21 03/27/2011 0645   GLUCOSE 83 03/27/2011 0645   BUN 14 03/27/2011 0645   CREATININE 0.54 03/27/2011 0645   CALCIUM 8.5 03/27/2011 0645   PROT 6.2 03/27/2011 0645   ALBUMIN 2.6* 03/27/2011 0645   AST 61* 03/27/2011 0645   ALT 138* 03/27/2011 0645   ALKPHOS 302* 03/27/2011 0645   BILITOT 1.0 03/27/2011 0645   GFRNONAA >90 03/27/2011 0645   GFRAA >90 03/27/2011 0645  Results for Garrett, Craig (MRN [id number]) as of 03/27/2011 10:59  Ref. Range 03/06/2011 05:28 03/13/2011 06:07 03/20/2011 05:35  Prealbumin Latest Range: 17.0-34.0 mg/dL 11.9 14.7 (H) 82.9  Prealbumin decreased.  Sodium low.   Intake/Output Summary (Last 24 hours) at 03/27/11 1054 Last data filed at 03/27/11 0700  Gross per 24 hour    Intake   2894 ml  Output    800 ml  Net   2094 ml    Weight Status:  Weight now stable with increase in calories from tpn/lipids and small amounts po. Wt Readings from Last 3 Encounters:  03/24/11 169 lb 3.2 oz (76.749 kg)  03/24/11 169 lb 3.2 oz (76.749 kg)  03/24/11 169 lb 3.2 oz (76.749 kg)   Re-estimated needs:  2400-2500 kcals, 110-120 grams protein per day.  Nutrition Dx:  Inadequate oral intake related to altered GI function--ongoing  Goal:  TPN to meet >90% estimated needs, maximize energy provision as able during national lipid backorder--met  Intervention:  Pt to be d/c'd on home tpn, regular diet.  Plans for a gastric emptying study as an outpatient if problems continue.  Monitor:  Pt for d/c   Jeoffrey Massed Pager #:  [phone]

## 2011-03-27 NOTE — Progress Notes (Signed)
PARENTERAL NUTRITION CONSULT NOTE - FOLLOW UP  Pharmacy Consult for TPN Indication: prolonged ileus  No Known Allergies  Patient Measurements: Height: 5\' 11"  (180.3 cm) Weight: 169 lb 3.2 oz (76.749 kg) IBW/kg (Calculated) : 75.3   Vital Signs: Temp: 98.2 F (36.8 C) (03/25 0516) BP: 178/92 mmHg (03/25 1032) Pulse Rate: 96  (03/25 1032) Intake/Output from previous day: 03/24 0701 - 03/25 0700 In: 3134 [P.O.:480; XBJ:4782] Out: 1300 [Urine:1300] Intake/Output from this shift: Total I/O In: 240 [P.O.:240] Out: -   Labs:  [facility] 03/27/11 0645  WBC 9.4  HGB 12.1*  HCT 36.7*  PLT 259  APTT --  INR --     Basename 03/27/11 0645 03/26/11 0500  NA 132* 131*  K 4.6 3.5  CL 101 99  CO2 21 25  GLUCOSE 83 149*  BUN 14 13  CREATININE 0.54 0.58  LABCREA -- --  CREAT24HRUR -- --  CALCIUM 8.5 8.5  MG 1.7 --  PHOS 3.4 --  PROT 6.2 --  ALBUMIN 2.6* --  AST 61* --  ALT 138* --  ALKPHOS 302* --  BILITOT 1.0 --  BILIDIR -- --  IBILI -- --  PREALBUMIN -- --  TRIG 77 --  CHOLHDL -- --  CHOL 108 --   Estimated Creatinine Clearance: 119 ml/min (by C-G formula based on Cr of 0.54).    Basename 03/27/11 1133 03/27/11 0740 03/27/11 0140  GLUCAP 110* 97 174*    Medications:  Scheduled:    . alteplase  2 mg Intracatheter Once  . alteplase  2 mg Intracatheter Once  . dicyclomine  20 mg Oral TID AC & HS  . insulin aspart  0-9 Units Subcutaneous Q8H  . metoprolol tartrate  12.5 mg Oral BID  . mulitivitamin with minerals  1 tablet Oral Daily  . ondansetron  4 mg Oral Q6H  . pantoprazole  40 mg Oral Q1200  . polyvinyl alcohol  1 drop Right Eye QID    Insulin Requirements in the past 24 hours:  2 units of sliding scale insulin + 15 units in the TPN  Assessment: 50 yom with pSBO and prolonged ileus to continue on TPN at home after discharge today. [facility].  Judieth Keens - Remains chronically hyponatremic, other lytes WNL - Renal -  Stable Scr - Endo - CBGs at goal on SSI + insulin in the TPN - Hepatobiliary - AST/ALT up slightly, alk phos down, Tbili WNL  Nutritional Goals:  2400-2500 kCal, 110-120 grams of protein per day  Plan:  1. Continue cyclic TPN after discharge today per orders at Home Care 2. Continue PO multivitamin or supplement via TPN 3. F/u diet advancement and clinical status  Kiely Cousar, Drake Leach 03/27/2011,12:33 PM

## 2011-03-27 NOTE — Discharge Instructions (Signed)
No lifting more than 10 pounds until 04/10/11.  Wash wound daily in shower with soap and water (keep IV dry). Apply antibiotic ointment (e.g. Neosporin) twice daily and as needed to keep moist. Cover with dry dressing.  Eat and drink to tolerance. Don't force yourself. Please keep track of amounts of food and liquid and note if you throw up. Bring this record with you to your appointments.

## 2011-03-27 NOTE — Discharge Summary (Signed)
Physician Discharge Summary  Patient ID: Craig Garrett MRN: [id number] DOB/AGE: [birth year] 51 y.o.  Admit date: 02/14/2011 Discharge date: 03/27/2011  Discharge Diagnoses Patient Active Problem List  Diagnoses Date Noted  . Nausea & vomiting 03/24/2011  . Dysphagia 03/24/2011  . Sinus tachycardia 02/26/2011  . SVT (supraventricular tachycardia) 02/25/2011  . Atypical chest pain 02/25/2011  . History of hepatitis C 02/23/2011  . Small bowel obstruction, partial 02/15/2011  . SB mesentery injury 02/07/2011  . Acute blood loss anemia 02/07/2011  . Fracture of right talus 02/07/2011  . Facial laceration 02/06/2011  . Right ankle fracture, lateral malleolus 02/06/2011  . Osteoarthritis 02/06/2011  . Tinea cruris 02/06/2011    Consultants Dr. Diona Browner for cardiology Dr. Miles Costain for interventional radiology Dr. Carola Frost for orthopedic surgery Dr. Christella Hartigan for gastroenterology  Procedures Exploratory laparotomy, partial SBR, partial right colectomy by Dr. Lindie Spruce CT guided intraperitoneal drain placement by Dr. Miles Costain EGD with brushings by Dr. Loreta Ave  HPI: Patient did well after discharge the day prior to admission until about 2300 when he developed acute onset anorexia and nausea. Had emesis x3 then developed severe generalized abdominal pain that persisted until admission. Had one further episode of emesis on arrival to the hospital. Denies fevers and chills. One loose bowel movement the morning of admission. CT scan was consistent with a small bowel obstruction. He was admitted to the trauma service.  Hospital Course: Initially the patient was managed conservatively with nasogastric tube and bowel rest. Physical and occupational therapies were restarted. He did not make any significant improvement and so was taken back to surgery on hospital day #9 where he underwent a partial small bowel and colon resection. Postoperatively he had some persistent tachycardia so cardiology was consulted and he was  controlled on a low dose beta blocker. Around postoperative day #5 the patient's white blood cell count rose to over 20. A CT scan showed an intraabdominal fluid collection. Interventional radiology was consulted and they placed a drain. He was started on empiric Zosyn. Vancomycin was added as he continued with a persistent leukocytosis and this seemed to help. Cultures were negative however. He was continued on for a full 14-day course of both. He continued to have significant nausea and pain intermittently. As his drain output had trailed off though he was still draining purulent fluid from his wound another CT scan was ordered. Because of this the drain was repositioned by radiology and again started draining. As the patient was not able to eat TPN was started and continued throughout the rest of his hospitalization. Orthopedic surgery was reconsulted as he had missed his scheduled outpatient follow-up appointment. He was able to be made full weightbearing and therapies were able to sign off soon after. Eventually the drain was able to be removed as well as the patient's nasogastric tube. Though he was having bowel movements he continued with nausea, emesis, and abdominal pain. He was placed on a PPI, an antispasmodic, and scheduled antiemetics without success. Gastroenterology was consulted but an EGD failed to suggest an etiology for the patient's symptoms. It was decided to discharge the patient on TPN and give this more time as he had been showing some signs of improvement. A gastric emptying study is planned as an outpatient if he continues to struggle. He was discharged home in stable condition.    Medication List  As of 03/27/2011  7:59 AM   STOP taking these medications         oxyCODONE-acetaminophen 5-325 MG per tablet  polyvinyl alcohol 1.4 % ophthalmic solution         TAKE these medications         dicyclomine 20 MG tablet   Commonly known as: BENTYL   Take 1 tablet (20 mg total)  by mouth 4 (four) times daily -  before meals and at bedtime.      metoprolol tartrate 12.5 mg Tabs   Commonly known as: LOPRESSOR   Take 0.5 tablets (12.5 mg total) by mouth 2 (two) times daily.      ondansetron 4 MG tablet   Commonly known as: ZOFRAN   Take 1 tablet (4 mg total) by mouth every 6 (six) hours.      pantoprazole 40 MG tablet   Commonly known as: PROTONIX   Take 1 tablet (40 mg total) by mouth daily at 12 noon.      tpn solution (CLINIMIX E 5/20) 5 % SOLN 2,300 mL with insulin regular 100 units/mL SOLN 15 Units   Inject into the vein continuous. 50 ml/hr x 1 hr, then 217 ml/hr x 10 hours, and then 50 ml/hr x 1 hr      traMADol 50 MG tablet   Commonly known as: ULTRAM   Take 1-2 tablets (50-100 mg total) by mouth every 6 (six) hours as needed for pain.             Follow-up Information    Follow up with [facility] on 04/06/2011. (2:00PM)    Contact information:   [address] [place] [address] [phone]      Schedule an appointment as soon as possible for a visit with Charna Elizabeth, MD.   Contact information:   [address], [place] [address] [phone]          Discharge planning took greater than 30 minutes.   Signed: [name], PA-C Pager: [phone] General Trauma PA Pager: [phone]  03/27/2011, 7:59 AM

## 2011-03-27 NOTE — Progress Notes (Signed)
DC home with HH. DL PICC intact capped off. No verbal questions about DC instructions. Family at bedside. Dsg supplies sent home with pt.Marland Kitchen

## 2011-03-27 NOTE — Discharge Summary (Signed)
Chevonne Bostrom, MD, MPH, FACS Pager: [phone]  

## 2011-03-27 NOTE — Progress Notes (Signed)
Patient ID: Craig Garrett, male   DOB: 1960/05/04, 51 y.o.   MRN: [id number]   LOS: 41 days   Subjective: Kept some food down yesterday. Otherwise no change.  Objective: Vital signs in last 24 hours: Temp:  [98.8 F (37.1 C)-99 F (37.2 C)] 98.8 F (37.1 C) (03/24 2145) Pulse Rate:  [110] 110  (03/24 2145) Resp:  [18] 18  (03/24 2145) BP: (112-123)/(72-80) 112/72 mmHg (03/24 2145) SpO2:  [96 %-97 %] 97 % (03/24 2145) Last BM Date: 03/24/11   General appearance: alert and no distress Resp: clear to auscultation bilaterally Cardio: regular rate and rhythm GI: Soft, +BS, wound granulating (some hypertrophy)   Assessment/Plan: PSBO s/p SB/colon resection/LOA -- EGD negative. GI suggests gastric emptying study as OP. Right foot/ankle fx- Now WBAT  ABD abscess -- Resolved (or nearly so).  Tachycardia -- Improved back on metoprolol.  L pleural effusion -- Stable  FEN -- Continue TPN  VTE -- Lovenox, SCD's  Dispo -- Home with TPN    [name], PA-C Pager: [phone] General Trauma PA Pager: [phone]   03/27/2011

## 2011-03-27 NOTE — Progress Notes (Signed)
UR of chart updated.  HHRN, home with TPN noted in today's PA note. [facility] notified of need and they have responded that pt is on schedule for tonight. Pt is aware that there will be forms to complete in order for this to be processed and he is aware.

## 2011-03-28 ENCOUNTER — Telehealth: Payer: Self-pay | Admitting: Orthopedic Surgery

## 2011-03-28 NOTE — Telephone Encounter (Signed)
Message was left that PICC line came out. At this point I suggested trying to take in what fluids and food he could and we would see how things went. I also suggested daily weights. If there were any signs of dehydration he is to call us or go to the emergency department.

## 2011-04-03 ENCOUNTER — Telehealth (INDEPENDENT_AMBULATORY_CARE_PROVIDER_SITE_OTHER): Payer: Self-pay | Admitting: General Surgery

## 2011-04-03 NOTE — Telephone Encounter (Signed)
Miranda, in the pharmacy of [facility], calling about the TPN for this pt.  The pt's PICC line is out.  Per Casimiro Needle Jeffrey's note of 03/28/11, pt was to begin po intake and he is to call if he cannot maintain good po fluids.

## 2011-04-05 ENCOUNTER — Encounter (INDEPENDENT_AMBULATORY_CARE_PROVIDER_SITE_OTHER): Payer: Self-pay

## 2011-04-06 ENCOUNTER — Encounter (INDEPENDENT_AMBULATORY_CARE_PROVIDER_SITE_OTHER): Payer: Self-pay

## 2011-04-12 ENCOUNTER — Telehealth: Payer: Self-pay | Admitting: Orthopedic Surgery

## 2011-04-12 NOTE — Telephone Encounter (Signed)
Called about clinic appt.

## 2011-04-13 ENCOUNTER — Telehealth (INDEPENDENT_AMBULATORY_CARE_PROVIDER_SITE_OTHER): Payer: Self-pay | Admitting: Physician Assistant

## 2011-04-13 NOTE — Telephone Encounter (Signed)
Made Appt for 04/20/11 @ 2pm

## 2011-04-20 ENCOUNTER — Encounter (INDEPENDENT_AMBULATORY_CARE_PROVIDER_SITE_OTHER): Payer: Self-pay

## 2011-04-20 ENCOUNTER — Ambulatory Visit (INDEPENDENT_AMBULATORY_CARE_PROVIDER_SITE_OTHER): Payer: Medicaid Other | Admitting: Orthopedic Surgery

## 2011-04-20 VITALS — BP 107/84 | HR 112 | Temp 98.4°F | Ht 69.0 in | Wt 163.4 lb

## 2011-04-20 DIAGNOSIS — R197 Diarrhea, unspecified: Secondary | ICD-10-CM

## 2011-04-20 NOTE — Progress Notes (Signed)
Subjective Craig Garrett comes in for followup. Craig Garrett has managed to gain significant weight since discharge. However Craig Garrett still is complaining of chronic diarrhea and averages around 5 loose stools per day. Craig Garrett notes a correlation between when Craig Garrett eats or drinks and when Craig Garrett has diarrhea. For example, Craig Garrett can drink a glass of water in the nose Craig Garrett will have a loose stool within 30 minutes. Craig Garrett has noticed undigested food in the stool. Craig Garrett denies blood. Craig Garrett also complains of excessive flatulence. Craig Garrett occasionally gets mild crampy abdominal pain but this is much improved since discharge. Craig Garrett also occasionally has early satiety but the nausea and vomiting seem to have resolved and Craig Garrett feels Craig Garrett is able to eat a sufficient amount. Craig Garrett denies fevers, chills, and sweats.   Objective General: Well-developed well-nourished in no acute distress Respiratory: Clear to auscultation bilaterally CV: Regular rate and rhythm Abdominal: Flat, minimally tender to palpation diffusely. Normoactive bowel sounds. Incision is well-healed with some exuberant granulation tissue in the superior quarter of the wound. This was treated with silver nitrate.   Assessment & Plan Diarrhea -- Included in the differential are disruption of normal flora, Clostridium difficile infection, and enteroenteric fistula. We'll obtain a CBC with differential, Clostridium difficile toxin assay, and an upper GI with small bowel follow-through. We will be in touch with the results. In the event these tests fail to suggest a diagnosis we will refer Craig Garrett to the gastroenterologist for followup and start Craig Garrett on Florastor.

## 2011-04-25 LAB — FUNGUS CULTURE W SMEAR: Fungal Smear: NONE SEEN

## 2011-04-28 ENCOUNTER — Telehealth (INDEPENDENT_AMBULATORY_CARE_PROVIDER_SITE_OTHER): Payer: Self-pay

## 2011-04-28 NOTE — Telephone Encounter (Signed)
[facility] is calling about an order that has been in their work que since 4/18 that has not been scheduled on this pt seen by Earney Hamburg and you in the trauma clininc. The last note states that the pt needs labs and UGI. Please check with Casimiro Needle to see if pt needs this stuff scheduled. If the pt doesn't the xray please discontinue the order if we don't need to schedule so it will go out of the hospital's work que.

## 2011-05-01 ENCOUNTER — Other Ambulatory Visit (INDEPENDENT_AMBULATORY_CARE_PROVIDER_SITE_OTHER): Payer: Self-pay | Admitting: Orthopedic Surgery

## 2011-05-03 ENCOUNTER — Other Ambulatory Visit (INDEPENDENT_AMBULATORY_CARE_PROVIDER_SITE_OTHER): Payer: Self-pay | Admitting: Orthopedic Surgery

## 2011-05-03 ENCOUNTER — Ambulatory Visit (HOSPITAL_COMMUNITY)
Admission: RE | Admit: 2011-05-03 | Discharge: 2011-05-03 | Disposition: A | Discharge status: Home / Self Care | Payer: Medicaid Other | Source: Ambulatory Visit | Attending: Orthopedic Surgery | Admitting: Orthopedic Surgery

## 2011-05-03 DIAGNOSIS — R197 Diarrhea, unspecified: Secondary | ICD-10-CM

## 2011-05-04 ENCOUNTER — Telehealth: Payer: Self-pay | Admitting: Orthopedic Surgery

## 2011-05-04 MED ORDER — LOPERAMIDE HCL 2 MG PO TABS: ORAL_TABLET | ORAL | Status: DC

## 2011-05-04 NOTE — Telephone Encounter (Signed)
Gave patient UGI test results and suggest loperamide for treatment. Also encouraged patient to have labs drawn that were ordered.

## 2011-09-07 ENCOUNTER — Telehealth (HOSPITAL_COMMUNITY): Payer: Self-pay | Admitting: Orthopedic Surgery

## 2011-09-07 ENCOUNTER — Emergency Department: Payer: Self-pay | Admitting: Emergency Medicine

## 2011-09-07 NOTE — Telephone Encounter (Signed)
Called and got a male at the second number left. He said daughter was at [facility] ED with [name]. I asked him to have her call when she got back.

## 2011-09-13 ENCOUNTER — Telehealth (HOSPITAL_COMMUNITY): Payer: Self-pay | Admitting: Orthopedic Surgery

## 2011-09-15 NOTE — Telephone Encounter (Signed)
Phone number rings once then goes silent.

## 2011-09-22 ENCOUNTER — Emergency Department: Payer: Self-pay | Admitting: Emergency Medicine

## 2011-11-06 ENCOUNTER — Emergency Department: Payer: Self-pay | Admitting: Emergency Medicine

## 2011-12-07 ENCOUNTER — Emergency Department: Payer: Self-pay | Admitting: Internal Medicine

## 2012-01-08 ENCOUNTER — Emergency Department: Payer: Self-pay | Admitting: Emergency Medicine

## 2012-02-17 ENCOUNTER — Emergency Department: Payer: Self-pay | Admitting: Emergency Medicine

## 2012-03-14 ENCOUNTER — Emergency Department: Payer: Self-pay | Admitting: Emergency Medicine

## 2013-09-24 DIAGNOSIS — R21 Rash and other nonspecific skin eruption: Secondary | ICD-10-CM | POA: Insufficient documentation
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
## 2013-11-05 DIAGNOSIS — L409 Psoriasis, unspecified: Secondary | ICD-10-CM | POA: Insufficient documentation

## 2014-01-09 DIAGNOSIS — M069 Rheumatoid arthritis, unspecified: Secondary | ICD-10-CM | POA: Diagnosis not present

## 2014-02-05 DIAGNOSIS — H04123 Dry eye syndrome of bilateral lacrimal glands: Secondary | ICD-10-CM | POA: Diagnosis not present

## 2014-02-05 DIAGNOSIS — R937 Abnormal findings on diagnostic imaging of other parts of musculoskeletal system: Secondary | ICD-10-CM | POA: Diagnosis not present

## 2014-02-05 DIAGNOSIS — M069 Rheumatoid arthritis, unspecified: Secondary | ICD-10-CM | POA: Diagnosis not present

## 2014-03-01 ENCOUNTER — Emergency Department: Payer: Self-pay | Admitting: Internal Medicine

## 2014-03-01 DIAGNOSIS — E119 Type 2 diabetes mellitus without complications: Secondary | ICD-10-CM | POA: Diagnosis not present

## 2014-03-01 DIAGNOSIS — M19072 Primary osteoarthritis, left ankle and foot: Secondary | ICD-10-CM | POA: Diagnosis not present

## 2014-03-01 DIAGNOSIS — Z720 Tobacco use: Secondary | ICD-10-CM | POA: Diagnosis not present

## 2014-03-01 DIAGNOSIS — M19071 Primary osteoarthritis, right ankle and foot: Secondary | ICD-10-CM | POA: Diagnosis not present

## 2014-03-12 DIAGNOSIS — N521 Erectile dysfunction due to diseases classified elsewhere: Secondary | ICD-10-CM | POA: Diagnosis not present

## 2014-03-12 DIAGNOSIS — Z800 Family history of malignant neoplasm of digestive organs: Secondary | ICD-10-CM | POA: Diagnosis not present

## 2014-03-12 DIAGNOSIS — Z0000 Encounter for general adult medical examination without abnormal findings: Secondary | ICD-10-CM | POA: Diagnosis not present

## 2014-03-12 DIAGNOSIS — M069 Rheumatoid arthritis, unspecified: Secondary | ICD-10-CM | POA: Diagnosis not present

## 2014-04-05 ENCOUNTER — Emergency Department
Admit: 2014-04-05 | Disposition: A | Discharge status: Home / Self Care | Payer: Self-pay | Admitting: Emergency Medicine

## 2014-04-05 DIAGNOSIS — K649 Unspecified hemorrhoids: Secondary | ICD-10-CM | POA: Diagnosis not present

## 2014-04-05 DIAGNOSIS — M25571 Pain in right ankle and joints of right foot: Secondary | ICD-10-CM | POA: Diagnosis not present

## 2014-04-05 DIAGNOSIS — Z720 Tobacco use: Secondary | ICD-10-CM | POA: Diagnosis not present

## 2014-04-13 DIAGNOSIS — K90829 Short bowel syndrome, unspecified: Secondary | ICD-10-CM | POA: Insufficient documentation

## 2014-04-13 DIAGNOSIS — Z23 Encounter for immunization: Secondary | ICD-10-CM | POA: Diagnosis not present

## 2014-04-13 DIAGNOSIS — F17200 Nicotine dependence, unspecified, uncomplicated: Secondary | ICD-10-CM | POA: Diagnosis not present

## 2014-04-13 DIAGNOSIS — K912 Postsurgical malabsorption, not elsewhere classified: Secondary | ICD-10-CM | POA: Diagnosis not present

## 2014-04-13 DIAGNOSIS — Z0000 Encounter for general adult medical examination without abnormal findings: Secondary | ICD-10-CM | POA: Diagnosis not present

## 2014-04-13 DIAGNOSIS — Z1211 Encounter for screening for malignant neoplasm of colon: Secondary | ICD-10-CM | POA: Diagnosis not present

## 2014-04-13 DIAGNOSIS — Z720 Tobacco use: Secondary | ICD-10-CM | POA: Diagnosis not present

## 2014-04-13 DIAGNOSIS — K644 Residual hemorrhoidal skin tags: Secondary | ICD-10-CM | POA: Insufficient documentation

## 2014-04-13 DIAGNOSIS — F1290 Cannabis use, unspecified, uncomplicated: Secondary | ICD-10-CM | POA: Insufficient documentation

## 2014-06-05 ENCOUNTER — Emergency Department: Payer: Medicare Other

## 2014-06-05 ENCOUNTER — Emergency Department
Admission: EM | Admit: 2014-06-05 | Discharge: 2014-06-05 | Disposition: A | Discharge status: Home / Self Care | Payer: Medicare Other | Attending: Student | Admitting: Student

## 2014-06-05 ENCOUNTER — Encounter: Payer: Self-pay | Admitting: Emergency Medicine

## 2014-06-05 DIAGNOSIS — Z87891 Personal history of nicotine dependence: Secondary | ICD-10-CM | POA: Insufficient documentation

## 2014-06-05 DIAGNOSIS — M7989 Other specified soft tissue disorders: Secondary | ICD-10-CM | POA: Diagnosis not present

## 2014-06-05 DIAGNOSIS — R0789 Other chest pain: Secondary | ICD-10-CM | POA: Diagnosis not present

## 2014-06-05 DIAGNOSIS — R319 Hematuria, unspecified: Secondary | ICD-10-CM | POA: Diagnosis not present

## 2014-06-05 DIAGNOSIS — R2241 Localized swelling, mass and lump, right lower limb: Secondary | ICD-10-CM | POA: Diagnosis not present

## 2014-06-05 DIAGNOSIS — R079 Chest pain, unspecified: Secondary | ICD-10-CM | POA: Diagnosis not present

## 2014-06-05 DIAGNOSIS — Z79899 Other long term (current) drug therapy: Secondary | ICD-10-CM | POA: Insufficient documentation

## 2014-06-05 DIAGNOSIS — K8020 Calculus of gallbladder without cholecystitis without obstruction: Secondary | ICD-10-CM | POA: Diagnosis not present

## 2014-06-05 DIAGNOSIS — M069 Rheumatoid arthritis, unspecified: Secondary | ICD-10-CM | POA: Diagnosis not present

## 2014-06-05 LAB — BASIC METABOLIC PANEL
ANION GAP: 9 (ref 5–15)
BUN: 8 mg/dL (ref 6–20)
CALCIUM: 8.9 mg/dL (ref 8.9–10.3)
CO2: 24 mmol/L (ref 22–32)
Chloride: 105 mmol/L (ref 101–111)
Creatinine, Ser: 0.79 mg/dL (ref 0.61–1.24)
GFR calc Af Amer: >60 mL/min (ref >60)
Glucose, Bld: 104 mg/dL — ABNORMAL HIGH (ref 65–99)
POTASSIUM: 3.5 mmol/L (ref 3.5–5.1)
SODIUM: 138 mmol/L (ref 135–145)

## 2014-06-05 LAB — CHLAMYDIA/NGC RT PCR (ARMC ONLY)
CHLAMYDIA TR: NOT DETECTED
N gonorrhoeae: NOT DETECTED

## 2014-06-05 LAB — CBC
HEMATOCRIT: 34.9 % — AB (ref 40.0–52.0)
Hemoglobin: 11.4 g/dL — ABNORMAL LOW (ref 13.0–18.0)
MCH: 31.7 pg (ref 26.0–34.0)
MCHC: 32.6 g/dL (ref 32.0–36.0)
MCV: 97.2 fL (ref 80.0–100.0)
Platelets: 490 10*3/uL — ABNORMAL HIGH (ref 150–440)
RBC: 3.59 MIL/uL — AB (ref 4.40–5.90)
RDW: 16.3 % — AB (ref 11.5–14.5)
WBC: 9 10*3/uL (ref 3.8–10.6)

## 2014-06-05 LAB — URINALYSIS COMPLETE WITH MICROSCOPIC (ARMC ONLY)
Bilirubin Urine: NEGATIVE
GLUCOSE, UA: NEGATIVE mg/dL
LEUKOCYTES UA: NEGATIVE
Nitrite: NEGATIVE
Protein, ur: 100 mg/dL — AB
SPECIFIC GRAVITY, URINE: 1.031 — AB (ref 1.005–1.030)
Squamous Epithelial / LPF: NONE SEEN
pH: 5 (ref 5.0–8.0)

## 2014-06-05 LAB — TROPONIN I

## 2014-06-05 MED ORDER — SODIUM CHLORIDE 0.9 % IV BOLUS (SEPSIS)
500.0000 mL | Freq: Once | INTRAVENOUS | Status: AC
  Administered 2014-06-05: 500 mL via INTRAVENOUS

## 2014-06-05 MED ORDER — OXYCODONE HCL 5 MG PO TABS
ORAL_TABLET | ORAL | Status: AC
  Administered 2014-06-05: 10 mg via ORAL
  Filled 2014-06-05: qty 2

## 2014-06-05 MED ORDER — IBUPROFEN 600 MG PO TABS: 600.0000 mg | ORAL_TABLET | Freq: Four times a day (QID) | ORAL | Status: DC | PRN

## 2014-06-05 MED ORDER — OXYCODONE HCL 5 MG PO TABS
10.0000 mg | ORAL_TABLET | Freq: Once | ORAL | Status: AC
  Administered 2014-06-05: 10 mg via ORAL

## 2014-06-05 MED ORDER — IOHEXOL 300 MG/ML  SOLN
100.0000 mL | Freq: Once | INTRAMUSCULAR | Status: AC | PRN
  Administered 2014-06-05: 100 mL via INTRAVENOUS

## 2014-06-05 NOTE — ED Notes (Signed)
Pt advised of wait, verbalized understanding, no distress noted, skin warm and dry

## 2014-06-05 NOTE — ED Notes (Signed)
Pt states he has RA and has not had his medications in a couple of weeks, states he has increased pain to his joints, states it his hard to walk due to pain, also states he has had some intermittent chest pain x 3 weeks and states he has had some hematuria for the last couple of days

## 2014-06-05 NOTE — ED Notes (Signed)
Returned from CT.

## 2014-06-05 NOTE — ED Provider Notes (Signed)
[facility] Emergency Department Provider Note  ____________________________________________  Time seen: Approximately 1:14 PM  I have reviewed the triage vital signs and the nursing notes.   HISTORY  Chief Complaint Hematuria; Rheumatoid Arthritis; and Chest Pain    HPI Craig Garrett. is a 54 y.o. male with hepatitis C, rheumatoid arthritis who presents for evaluation of multiple complaints. His most salient complaint is of complaint painless hematuria that has occurred 3 times since last night. No nausea vomiting or diarrhea. He also reports intermittent/once daily chest pain for 3 weeks. The pain throbs in the center of his chest is not radiating is not worsened by exertion to see with any shortness of breath. It comes on gradually, lasts for 45 minutes and then resolves. Has no history of coronary artery disease or family history of coronary artery disease. Additionally he has had 4 months of atraumatic right lower extremity swelling and pain. He reports this happens to him when he has a flare of his rheumatoid arthritis and he has not been able to get his medications for the past 3 weeks due to cost. Today he was scheduled to pick up his medications from his pharmacy however because of his hematuria he presented to the emergency department. Current severity of all complaints is mild. No recent illness including no cough, sneezing, runny nose, congestion, nausea, vomiting or diarrhea.   Past Medical History  Diagnosis Date  . Arthritis   . Tinea cruris   . Hepatitis C     Patient Active Problem List   Diagnosis Date Noted  . Nausea & vomiting 03/24/2011  . Dysphagia 03/24/2011  . Sinus tachycardia 02/26/2011  . SVT (supraventricular tachycardia) 02/25/2011  . Atypical chest pain 02/25/2011  . History of hepatitis C 02/23/2011  . Small bowel obstruction, partial 02/15/2011  . SB mesentery injury 02/07/2011  . Acute blood loss anemia 02/07/2011  .  Fracture of right talus 02/07/2011  . Facial laceration 02/06/2011  . Right ankle fracture, lateral malleolus 02/06/2011  . Osteoarthritis 02/06/2011  . Tinea cruris 02/06/2011    Past Surgical History  Procedure Laterality Date  . Left wrist surgery    . Laparotomy  02/06/2011    Procedure: EXPLORATORY LAPAROTOMY;  Surgeon: Jetty Duhamel, MD;  Location: [facility] OR;  Service: General;  Laterality: N/A;  Exploratory laporatomy, small bowel resection, and incidental appendectomy  . Bowel resection  02/06/2011    Procedure: SMALL BOWEL RESECTION;  Surgeon: Jetty Duhamel, MD;  Location: [facility] OR;  Service: General;;  Exploratory laporatomy, small bowel resection, and incidental appendectomy  . Appendectomy  02/06/2011    Procedure: APPENDECTOMY;  Surgeon: Jetty Duhamel, MD;  Location: [facility] OR;  Service: General;  Laterality: N/A;  Exploratory laporatomy, small bowel resection, and incidental appendectomy  . Laceration repair  02/06/2011    Procedure: REPAIR MULTIPLE LACERATIONS;  Surgeon: Darletta Moll, MD;  Location: [facility] OR;  Service: ENT;  Laterality: Right;  . Laparotomy  02/22/2011    Procedure: EXPLORATORY LAPAROTOMY;  Surgeon: Jetty Duhamel, MD;  Location: [facility] OR;  Service: General;  Laterality: N/A;  . Esophagogastroduodenoscopy  03/25/2011    Procedure: ESOPHAGOGASTRODUODENOSCOPY (EGD);  Surgeon: Charna Elizabeth, MD;  Location: [facility] ENDOSCOPY;  Service: Endoscopy;  Laterality: N/A;  . Colon surgery      Current Outpatient Rx  Name  Route  Sig  Dispense  Refill  . loperamide (IMODIUM A-D) 2 MG tablet      4mg   initially then 2mg  after every loose stool. Max 16mg /day.   50 tablet   1   . metoprolol tartrate (LOPRESSOR) 12.5 mg TABS   Oral   Take 0.5 tablets (12.5 mg total) by mouth 2 (two) times daily.   30 tablet   0   . tpn solution (CLINIMIX E 5/20) 5 % SOLN 2,300 mL with insulin regular 100 units/mL SOLN 15 Units      Inject into the vein continuous. 50 ml/hr x 1 hr, then 217 ml/hr x  10 hours, and then 50 ml/hr x 1 hr           Allergies Review of patient's allergies indicates no known allergies.  Family History  Problem Relation Age of Onset  . Hypertension      Social History History  Substance Use Topics  . Smoking status: Former Smoker -- 0.50 packs/day for 15 years    Types: Cigarettes  . Smokeless tobacco: Former    Quit date: 01/06/2011  . Alcohol Use: No    Review of Systems Constitutional: No fever/chills Eyes: No visual changes. ENT: No sore throat. Cardiovascular: + chest pain. Respiratory: Denies shortness of breath. Gastrointestinal: No abdominal pain.  No nausea, no vomiting.  No diarrhea.  No constipation. Genitourinary: Negative for dysuria. Musculoskeletal: Negative for back pain. Skin: Negative for rash. Neurological: Negative for headaches, focal weakness or numbness.  10-point ROS otherwise negative.  ____________________________________________   PHYSICAL EXAM:  VITAL SIGNS: ED Triage Vitals  Enc Vitals Group     BP 06/05/14 0752 128/89 mmHg     Pulse Rate 06/05/14 0752 107     Resp 06/05/14 0752 16     Temp 06/05/14 0752 98.2 F (36.8 C)     Temp Source 06/05/14 0752 Oral     SpO2 06/05/14 0752 98 %     Weight 06/05/14 0752 150 lb (68.04 kg)     Height 06/05/14 0752 5\' 10"  (1.778 m)     Head Cir --      Peak Flow --      Pain Score 06/05/14 0753 10     Pain Loc --      Pain Edu? --      Excl. in GC? --     Constitutional: Alert and oriented. Well appearing and in no acute distress. Eyes: Conjunctivae are normal. PERRL. EOMI. Head: Atraumatic. Nose: No congestion/rhinnorhea. Mouth/Throat: Mucous membranes are moist.  Oropharynx non-erythematous. Neck: No stridor.   Cardiovascular: Normal rate, regular rhythm. Grossly normal heart sounds.  Good peripheral circulation. Respiratory: Normal respiratory effort.  No retractions. Lungs CTAB. Gastrointestinal: Soft and nontender. No distention. No abdominal  bruits. No CVA tenderness. Genitourinary: deferred Musculoskeletal: Moderate right ankle swelling with mild extension of swelling into the right calf with mild tenderness in the calf, 2+ right dorsalis pedis pulse, wiggles the toes. Neurologic:  Normal speech and language. No gross focal neurologic deficits are appreciated. Speech is normal. No gait instability . He does have antalgic gait secondary to pain in the right ankle. Skin:  Skin is warm, dry and intact. No rash noted. Psychiatric: Mood and affect are normal. Speech and behavior are normal.  ____________________________________________   LABS (all labs ordered are listed, but only abnormal results are displayed)  Labs Reviewed  CBC - Abnormal; Notable for the following:    RBC 3.59 (*)    Hemoglobin 11.4 (*)    HCT 34.9 (*)    RDW 16.3 (*)    Platelets 490 (*)  All other components within normal limits  BASIC METABOLIC PANEL - Abnormal; Notable for the following:    Glucose, Bld 104 (*)    All other components within normal limits  URINALYSIS COMPLETEWITH MICROSCOPIC ([facility] ONLY) - Abnormal; Notable for the following:    Color, Urine AMBER (*)    APPearance TURBID (*)    Ketones, ur TRACE (*)    Specific Gravity, Urine 1.031 (*)    Hgb urine dipstick 3+ (*)    Protein, ur 100 (*)    Bacteria, UA RARE (*)    All other components within normal limits  CHLAMYDIA/NGC RT PCR ([facility] ONLY)  TROPONIN I  TROPONIN I   ____________________________________________  EKG  ED ECG REPORT I, Gayla Doss, the attending physician, personally viewed and interpreted this ECG.   Date: 06/05/2014  EKG Time: 08:03  Rate: 87  Rhythm: normal sinus rhythm with sinus arrhythmia and short PR  Axis: normal  Intervals: Short PR interval, otherwise normal  ST&T Change: No acute ST segment change  ____________________________________________  RADIOLOGY  CXR FINDINGS: There is no edema or consolidation. The heart size  and pulmonary vascularity are normal. No adenopathy. No bone lesions. No pneumothorax. IMPRESSION: No edema or consolidation.   CT Abdomen and Pelvis IMPRESSION: 1. No acute findings in the abdomen pelvis. 2. Enteric colonic anastomosis in the right lower quadrant without complication. 3. Cholelithiasis without acute cholecystitis.  Venous doppler ultrasound of the RLE IMPRESSION: 1. No evidence for deep venous thrombosis. 2. Enlarged benign appearing bilateral inguinal lymph nodes, right greater than left.  ____________________________________________   PROCEDURES  Procedure(s) performed: None  Critical Care performed: No  ____________________________________________   INITIAL IMPRESSION / ASSESSMENT AND PLAN / ED COURSE  Pertinent labs & imaging results that were available during my care of the patient were reviewed by me and considered in my medical decision making (see chart for details).  Craig Garrett. is a 54 y.o. male with hepatitis C, rheumatoid arthritis who presents for evaluation of multiple complaints. On exam, he is generally nontoxic appearing, in no acute distress. He was mildly tachycardic when he arrived at approximately 8 AM this morning however at this point,  tachycardia is resolved without any intervention. For his painless hematuria, awaiting urinalysis but plan for contrasted CT of the abdomen and pelvis to further evaluate. His chest pain is nonspecific, not exertional, not consistent with ACS, PE or a acute aortic dissection. EKG is negative for any acute ischemic change, first troponin negative. We'll plan for second troponin. Right lower extremity swelling is worse at the right ankle and likely secondary to rheumatoid arthritis flare in the setting of medication noncompliance however because he has mild tenderness and swelling in the right calf we'll obtain venous Doppler ultrasound. We'll treat his  pain.  ----------------------------------------- 7:16 PM on 06/05/2014 -----------------------------------------  Vital signs stable. Labs notable for stable anemia. Pain significantly improved. Imaging negative. Second troponin is negative. Discussed need for follow-up with urology regarding painless hematuria. UA does not appear consistent with urinary tract infection. Return precautions discussed. He will follow up with his pharmacy for his medications. He is comfortable with the discharge plan. ____________________________________________   FINAL CLINICAL IMPRESSION(S) / ED DIAGNOSES  Final diagnoses:  Rheumatoid arthritis flare  Chest pain, atypical  Hematuria      Gayla Doss, MD 06/05/14 1918

## 2014-06-05 NOTE — ED Notes (Signed)
Pt c/o of arthritic pain and was supposed to go at 1300 to the [facility] to receive weekly dose of Methotrexate PO.  Also states he was having blood in his urine that started last night and continued this morning.

## 2014-06-11 DIAGNOSIS — M06071 Rheumatoid arthritis without rheumatoid factor, right ankle and foot: Secondary | ICD-10-CM | POA: Diagnosis not present

## 2014-06-11 DIAGNOSIS — M25571 Pain in right ankle and joints of right foot: Secondary | ICD-10-CM | POA: Diagnosis not present

## 2014-06-11 DIAGNOSIS — M659 Synovitis and tenosynovitis, unspecified: Secondary | ICD-10-CM | POA: Diagnosis not present

## 2014-06-11 DIAGNOSIS — S82831A Other fracture of upper and lower end of right fibula, initial encounter for closed fracture: Secondary | ICD-10-CM | POA: Diagnosis not present

## 2014-07-01 ENCOUNTER — Encounter: Payer: Self-pay | Admitting: Urology

## 2014-07-01 ENCOUNTER — Ambulatory Visit: Payer: Self-pay

## 2014-07-02 ENCOUNTER — Encounter: Payer: Self-pay | Admitting: Family Medicine

## 2014-07-15 DIAGNOSIS — M069 Rheumatoid arthritis, unspecified: Secondary | ICD-10-CM | POA: Diagnosis not present

## 2014-07-15 DIAGNOSIS — S82891G Other fracture of right lower leg, subsequent encounter for closed fracture with delayed healing: Secondary | ICD-10-CM | POA: Diagnosis not present

## 2014-07-15 DIAGNOSIS — S82891D Other fracture of right lower leg, subsequent encounter for closed fracture with routine healing: Secondary | ICD-10-CM | POA: Diagnosis not present

## 2014-07-15 DIAGNOSIS — M85871 Other specified disorders of bone density and structure, right ankle and foot: Secondary | ICD-10-CM | POA: Diagnosis not present

## 2014-07-15 DIAGNOSIS — F1290 Cannabis use, unspecified, uncomplicated: Secondary | ICD-10-CM | POA: Diagnosis not present

## 2014-07-15 DIAGNOSIS — F17210 Nicotine dependence, cigarettes, uncomplicated: Secondary | ICD-10-CM | POA: Diagnosis not present

## 2014-08-18 DIAGNOSIS — M25471 Effusion, right ankle: Secondary | ICD-10-CM | POA: Diagnosis not present

## 2014-08-18 DIAGNOSIS — M19271 Secondary osteoarthritis, right ankle and foot: Secondary | ICD-10-CM | POA: Diagnosis not present

## 2014-08-18 DIAGNOSIS — M7731 Calcaneal spur, right foot: Secondary | ICD-10-CM | POA: Diagnosis not present

## 2014-08-18 DIAGNOSIS — M25571 Pain in right ankle and joints of right foot: Secondary | ICD-10-CM | POA: Diagnosis not present

## 2014-08-18 DIAGNOSIS — M19071 Primary osteoarthritis, right ankle and foot: Secondary | ICD-10-CM | POA: Diagnosis not present

## 2014-09-10 DIAGNOSIS — F1910 Other psychoactive substance abuse, uncomplicated: Secondary | ICD-10-CM | POA: Diagnosis not present

## 2014-09-10 DIAGNOSIS — M810 Age-related osteoporosis without current pathological fracture: Secondary | ICD-10-CM | POA: Diagnosis not present

## 2014-09-10 DIAGNOSIS — M0579 Rheumatoid arthritis with rheumatoid factor of multiple sites without organ or systems involvement: Secondary | ICD-10-CM | POA: Diagnosis not present

## 2014-09-10 DIAGNOSIS — R601 Generalized edema: Secondary | ICD-10-CM | POA: Diagnosis not present

## 2014-09-17 DIAGNOSIS — M810 Age-related osteoporosis without current pathological fracture: Secondary | ICD-10-CM | POA: Diagnosis not present

## 2014-10-07 DIAGNOSIS — K912 Postsurgical malabsorption, not elsewhere classified: Secondary | ICD-10-CM | POA: Diagnosis not present

## 2014-10-07 DIAGNOSIS — K6389 Other specified diseases of intestine: Secondary | ICD-10-CM | POA: Diagnosis not present

## 2014-11-04 DIAGNOSIS — M0579 Rheumatoid arthritis with rheumatoid factor of multiple sites without organ or systems involvement: Secondary | ICD-10-CM | POA: Diagnosis not present

## 2014-11-04 DIAGNOSIS — Z79899 Other long term (current) drug therapy: Secondary | ICD-10-CM | POA: Diagnosis not present

## 2014-11-06 DIAGNOSIS — R7611 Nonspecific reaction to tuberculin skin test without active tuberculosis: Secondary | ICD-10-CM | POA: Diagnosis not present

## 2015-01-06 DIAGNOSIS — J208 Acute bronchitis due to other specified organisms: Secondary | ICD-10-CM | POA: Diagnosis not present

## 2015-01-06 DIAGNOSIS — M0579 Rheumatoid arthritis with rheumatoid factor of multiple sites without organ or systems involvement: Secondary | ICD-10-CM | POA: Diagnosis not present

## 2015-01-06 DIAGNOSIS — Z79899 Other long term (current) drug therapy: Secondary | ICD-10-CM | POA: Diagnosis not present

## 2015-01-06 DIAGNOSIS — M79642 Pain in left hand: Secondary | ICD-10-CM | POA: Diagnosis not present

## 2015-03-08 DIAGNOSIS — J208 Acute bronchitis due to other specified organisms: Secondary | ICD-10-CM | POA: Diagnosis not present

## 2015-03-08 DIAGNOSIS — M79642 Pain in left hand: Secondary | ICD-10-CM | POA: Diagnosis not present

## 2015-03-08 DIAGNOSIS — R05 Cough: Secondary | ICD-10-CM | POA: Diagnosis not present

## 2015-03-08 DIAGNOSIS — M0579 Rheumatoid arthritis with rheumatoid factor of multiple sites without organ or systems involvement: Secondary | ICD-10-CM | POA: Diagnosis not present

## 2015-06-01 DIAGNOSIS — M0579 Rheumatoid arthritis with rheumatoid factor of multiple sites without organ or systems involvement: Secondary | ICD-10-CM | POA: Diagnosis not present

## 2015-06-07 DIAGNOSIS — M79642 Pain in left hand: Secondary | ICD-10-CM | POA: Diagnosis not present

## 2015-06-07 DIAGNOSIS — M0579 Rheumatoid arthritis with rheumatoid factor of multiple sites without organ or systems involvement: Secondary | ICD-10-CM | POA: Diagnosis not present

## 2015-06-07 DIAGNOSIS — M25561 Pain in right knee: Secondary | ICD-10-CM | POA: Diagnosis not present

## 2015-06-07 DIAGNOSIS — G8929 Other chronic pain: Secondary | ICD-10-CM | POA: Diagnosis not present

## 2015-09-08 DIAGNOSIS — G8929 Other chronic pain: Secondary | ICD-10-CM | POA: Diagnosis not present

## 2015-09-08 DIAGNOSIS — M0579 Rheumatoid arthritis with rheumatoid factor of multiple sites without organ or systems involvement: Secondary | ICD-10-CM | POA: Diagnosis not present

## 2015-09-08 DIAGNOSIS — Z79899 Other long term (current) drug therapy: Secondary | ICD-10-CM | POA: Diagnosis not present

## 2015-09-08 DIAGNOSIS — M25571 Pain in right ankle and joints of right foot: Secondary | ICD-10-CM | POA: Diagnosis not present

## 2015-12-13 DIAGNOSIS — G8929 Other chronic pain: Secondary | ICD-10-CM | POA: Diagnosis not present

## 2015-12-13 DIAGNOSIS — R21 Rash and other nonspecific skin eruption: Secondary | ICD-10-CM | POA: Diagnosis not present

## 2015-12-13 DIAGNOSIS — M25571 Pain in right ankle and joints of right foot: Secondary | ICD-10-CM | POA: Diagnosis not present

## 2015-12-13 DIAGNOSIS — Z79899 Other long term (current) drug therapy: Secondary | ICD-10-CM | POA: Diagnosis not present

## 2015-12-13 DIAGNOSIS — M0579 Rheumatoid arthritis with rheumatoid factor of multiple sites without organ or systems involvement: Secondary | ICD-10-CM | POA: Diagnosis not present

## 2016-06-12 DIAGNOSIS — Z79899 Other long term (current) drug therapy: Secondary | ICD-10-CM | POA: Diagnosis not present

## 2016-06-12 DIAGNOSIS — M0579 Rheumatoid arthritis with rheumatoid factor of multiple sites without organ or systems involvement: Secondary | ICD-10-CM | POA: Diagnosis not present

## 2016-06-16 DIAGNOSIS — E538 Deficiency of other specified B group vitamins: Secondary | ICD-10-CM | POA: Diagnosis not present

## 2016-06-16 DIAGNOSIS — D649 Anemia, unspecified: Secondary | ICD-10-CM | POA: Diagnosis not present

## 2016-07-13 DIAGNOSIS — D510 Vitamin B12 deficiency anemia due to intrinsic factor deficiency: Secondary | ICD-10-CM | POA: Diagnosis not present

## 2016-07-13 DIAGNOSIS — M0579 Rheumatoid arthritis with rheumatoid factor of multiple sites without organ or systems involvement: Secondary | ICD-10-CM | POA: Diagnosis not present

## 2016-07-13 DIAGNOSIS — D649 Anemia, unspecified: Secondary | ICD-10-CM | POA: Diagnosis not present

## 2016-07-14 DIAGNOSIS — D649 Anemia, unspecified: Secondary | ICD-10-CM | POA: Diagnosis not present

## 2016-07-17 ENCOUNTER — Other Ambulatory Visit: Payer: Self-pay | Admitting: Rheumatology

## 2016-07-17 DIAGNOSIS — R634 Abnormal weight loss: Secondary | ICD-10-CM

## 2016-07-17 DIAGNOSIS — D649 Anemia, unspecified: Secondary | ICD-10-CM

## 2016-07-19 DIAGNOSIS — D510 Vitamin B12 deficiency anemia due to intrinsic factor deficiency: Secondary | ICD-10-CM | POA: Diagnosis not present

## 2016-07-19 DIAGNOSIS — D649 Anemia, unspecified: Secondary | ICD-10-CM | POA: Diagnosis not present

## 2016-07-21 ENCOUNTER — Ambulatory Visit
Admission: RE | Admit: 2016-07-21 | Discharge: 2016-07-21 | Disposition: A | Discharge status: Home / Self Care | Payer: Medicare Other | Source: Ambulatory Visit | Attending: Rheumatology | Admitting: Rheumatology

## 2016-07-21 DIAGNOSIS — D510 Vitamin B12 deficiency anemia due to intrinsic factor deficiency: Secondary | ICD-10-CM | POA: Insufficient documentation

## 2016-07-21 DIAGNOSIS — D649 Anemia, unspecified: Secondary | ICD-10-CM

## 2016-07-21 DIAGNOSIS — R911 Solitary pulmonary nodule: Secondary | ICD-10-CM | POA: Diagnosis not present

## 2016-07-21 DIAGNOSIS — K8020 Calculus of gallbladder without cholecystitis without obstruction: Secondary | ICD-10-CM | POA: Insufficient documentation

## 2016-07-21 DIAGNOSIS — I700 Atherosclerosis of aorta: Secondary | ICD-10-CM | POA: Insufficient documentation

## 2016-07-21 DIAGNOSIS — J439 Emphysema, unspecified: Secondary | ICD-10-CM | POA: Diagnosis not present

## 2016-07-21 DIAGNOSIS — R05 Cough: Secondary | ICD-10-CM | POA: Diagnosis not present

## 2016-07-21 DIAGNOSIS — M0579 Rheumatoid arthritis with rheumatoid factor of multiple sites without organ or systems involvement: Secondary | ICD-10-CM | POA: Diagnosis not present

## 2016-07-21 DIAGNOSIS — R634 Abnormal weight loss: Secondary | ICD-10-CM | POA: Insufficient documentation

## 2016-07-21 MED ORDER — IOPAMIDOL (ISOVUE-300) INJECTION 61%
100.0000 mL | Freq: Once | INTRAVENOUS | Status: AC | PRN
  Administered 2016-07-21: 100 mL via INTRAVENOUS

## 2016-07-31 ENCOUNTER — Encounter: Payer: Self-pay | Admitting: Emergency Medicine

## 2016-07-31 ENCOUNTER — Emergency Department
Admission: EM | Admit: 2016-07-31 | Discharge: 2016-07-31 | Disposition: A | Discharge status: Home / Self Care | Payer: Medicare Other | Attending: Emergency Medicine | Admitting: Emergency Medicine

## 2016-07-31 DIAGNOSIS — Z79899 Other long term (current) drug therapy: Secondary | ICD-10-CM | POA: Diagnosis not present

## 2016-07-31 DIAGNOSIS — Z87891 Personal history of nicotine dependence: Secondary | ICD-10-CM | POA: Insufficient documentation

## 2016-07-31 DIAGNOSIS — L03011 Cellulitis of right finger: Secondary | ICD-10-CM | POA: Diagnosis not present

## 2016-07-31 DIAGNOSIS — R600 Localized edema: Secondary | ICD-10-CM | POA: Diagnosis present

## 2016-07-31 MED ORDER — LIDOCAINE HCL (PF) 1 % IJ SOLN
INTRAMUSCULAR | Status: AC
  Filled 2016-07-31: qty 5

## 2016-07-31 MED ORDER — OXYCODONE-ACETAMINOPHEN 5-325 MG PO TABS
1.0000 | ORAL_TABLET | Freq: Once | ORAL | Status: AC
  Administered 2016-07-31: 1 via ORAL
  Filled 2016-07-31: qty 1

## 2016-07-31 MED ORDER — IBUPROFEN 600 MG PO TABS
600.0000 mg | ORAL_TABLET | Freq: Once | ORAL | Status: AC
  Administered 2016-07-31: 600 mg via ORAL
  Filled 2016-07-31: qty 1

## 2016-07-31 MED ORDER — IBUPROFEN 600 MG PO TABS: 600.0000 mg | ORAL_TABLET | Freq: Three times a day (TID) | ORAL | 0 refills | Status: DC | PRN

## 2016-07-31 MED ORDER — OXYCODONE-ACETAMINOPHEN 5-325 MG PO TABS: 1.0000 | ORAL_TABLET | Freq: Four times a day (QID) | ORAL | 0 refills | Status: DC | PRN

## 2016-07-31 MED ORDER — SULFAMETHOXAZOLE-TRIMETHOPRIM 800-160 MG PO TABS: 1.0000 | ORAL_TABLET | Freq: Two times a day (BID) | ORAL | 0 refills | Status: DC

## 2016-07-31 NOTE — ED Triage Notes (Signed)
States he presents with swelling to right index finger for couple days  denies any onjury

## 2016-07-31 NOTE — ED Provider Notes (Signed)
[facility] Emergency Department Provider Note   ____________________________________________   First MD Initiated Contact with Patient 07/31/16 1134     (approximate)  I have reviewed the triage vital signs and the nursing notes.   HISTORY  Chief Complaint Hand Pain    HPI Craig Garrett. is a 56 y.o. male patient were to ED if complaining of pain and swelling to the right index finger localized days. Patient state is a common occurrence secondary to him was cutting of fingernails. Patient rates pain as a 5/10. No palliative measures for complaint. Patient described a pain as "pressure".  Past Medical History:  Diagnosis Date  . Arthritis   . Hepatitis C   . Tinea cruris     Patient Active Problem List   Diagnosis Date Noted  . Nausea & vomiting 03/24/2011  . Dysphagia 03/24/2011  . Sinus tachycardia 02/26/2011  . SVT (supraventricular tachycardia) (HCC) 02/25/2011  . Atypical chest pain 02/25/2011  . History of hepatitis C 02/23/2011  . Small bowel obstruction, partial (HCC) 02/15/2011  . SB mesentery injury 02/07/2011  . Acute blood loss anemia 02/07/2011  . Fracture of right talus 02/07/2011  . Facial laceration 02/06/2011  . Right ankle fracture, lateral malleolus 02/06/2011  . Osteoarthritis 02/06/2011  . Tinea cruris 02/06/2011    Past Surgical History:  Procedure Laterality Date  . APPENDECTOMY  02/06/2011   Procedure: APPENDECTOMY;  Surgeon: Jetty Duhamel, MD;  Location: [facility] OR;  Service: General;  Laterality: N/A;  Exploratory laporatomy, small bowel resection, and incidental appendectomy  . BOWEL RESECTION  02/06/2011   Procedure: SMALL BOWEL RESECTION;  Surgeon: Jetty Duhamel, MD;  Location: [facility] OR;  Service: General;;  Exploratory laporatomy, small bowel resection, and incidental appendectomy  . COLON SURGERY    . ESOPHAGOGASTRODUODENOSCOPY  03/25/2011   Procedure: ESOPHAGOGASTRODUODENOSCOPY (EGD);  Surgeon: Charna Elizabeth,  MD;  Location: [facility] ENDOSCOPY;  Service: Endoscopy;  Laterality: N/A;  . LACERATION REPAIR  02/06/2011   Procedure: REPAIR MULTIPLE LACERATIONS;  Surgeon: Darletta Moll, MD;  Location: [facility] OR;  Service: ENT;  Laterality: Right;  . LAPAROTOMY  02/06/2011   Procedure: EXPLORATORY LAPAROTOMY;  Surgeon: Jetty Duhamel, MD;  Location: [facility] OR;  Service: General;  Laterality: N/A;  Exploratory laporatomy, small bowel resection, and incidental appendectomy  . LAPAROTOMY  02/22/2011   Procedure: EXPLORATORY LAPAROTOMY;  Surgeon: Jetty Duhamel, MD;  Location: [facility] OR;  Service: General;  Laterality: N/A;  . Left wrist surgery      Prior to Admission medications   Medication Sig Start Date End Date Taking? Authorizing Provider  ibuprofen (ADVIL,MOTRIN) 600 MG tablet Take 1 tablet (600 mg total) by mouth every 6 (six) hours as needed for moderate pain. 06/05/14   Gayla Doss, MD  ibuprofen (ADVIL,MOTRIN) 600 MG tablet Take 1 tablet (600 mg total) by mouth every 8 (eight) hours as needed. 07/31/16   Joni Reining, PA-C  loperamide (IMODIUM A-D) 2 MG tablet 4mg  initially then 2mg  after every loose stool. Max 16mg /day. 05/04/11   , PA-C  metoprolol tartrate (LOPRESSOR) 12.5 mg TABS Take 0.5 tablets (12.5 mg total) by mouth 2 (two) times daily. 03/27/11   07/04/11, PA-C  oxyCODONE-acetaminophen (ROXICET) 5-325 MG tablet Take 1 tablet by mouth every 6 (six) hours as needed for moderate pain. 07/31/16   03/29/11, PA-C  sulfamethoxazole-trimethoprim (BACTRIM DS,SEPTRA DS) 800-160 MG tablet Take 1 tablet by mouth  2 (two) times daily. 07/31/16   Joni Reining, PA-C  tpn solution (CLINIMIX E 5/20) 5 % SOLN 2,300 mL with insulin regular 100 units/mL SOLN 15 Units Inject into the vein continuous. 50 ml/hr x 1 hr, then 217 ml/hr x 10 hours, and then 50 ml/hr x 1 hr 03/27/11   [name], PA-C    Allergies Patient has no known allergies.  Family History  Problem Relation Age of Onset   . Hypertension Unknown     Social History Social History  Substance Use Topics  . Smoking status: Former Smoker    Packs/day: 0.50    Years: 15.00    Types: Cigarettes  . Smokeless tobacco: Former Neurosurgeon    Quit date: 01/06/2011  . Alcohol use No    Review of Systems  Constitutional: No fever/chills Eyes: No visual changes. ENT: No sore throat. Cardiovascular: Denies chest pain. Respiratory: Denies shortness of breath. Gastrointestinal: No abdominal pain.  No nausea, no vomiting.  No diarrhea.  No constipation. Genitourinary: Negative for dysuria. Musculoskeletal: Negative for back pain. Skin: Negative for rash. Neurological: Negative for headaches, focal weakness or numbness. Endocrine:Hepatitis C   ____________________________________________   PHYSICAL EXAM:  VITAL SIGNS: ED Triage Vitals  Enc Vitals Group     BP 07/31/16 1115 117/81     Pulse Rate 07/31/16 1115 (!) 56     Resp 07/31/16 1115 16     Temp 07/31/16 1115 98.4 F (36.9 C)     Temp Source 07/31/16 1115 Oral     SpO2 07/31/16 1115 99 %     Weight 07/31/16 1115 140 lb (63.5 kg)     Height 07/31/16 1115 5\' 11"  (1.803 m)     Head Circumference --      Peak Flow --      Pain Score 07/31/16 1124 5     Pain Loc --      Pain Edu? --      Excl. in GC? --     Constitutional: Alert and oriented. Well appearing and in no acute distress. Cardiovascular:Nonsymptomatic bradycardia.. Grossly normal heart sounds.  Good peripheral circulation. Respiratory: Normal respiratory effort.  No retractions. Lungs CTAB. Neurologic:  Normal speech and language. No gross focal neurologic deficits are appreciated. No gait instability. Skin:  Skin is warm, dry and intact. No rash noted. Edematous erythematous now. Second digit right hand. Psychiatric: Mood and affect are normal. Speech and behavior are normal.  ____________________________________________   LABS (all labs ordered are listed, but only abnormal results  are displayed)  Labs Reviewed - No data to display ____________________________________________  EKG   ____________________________________________  RADIOLOGY  No results found.  ____________________________________________   PROCEDURES  Procedure(s) performed: None  Procedures  Critical Care performed: No  ____________________________________________   INITIAL IMPRESSION / ASSESSMENT AND PLAN / ED COURSE  Pertinent labs & imaging results that were available during my care of the patient were reviewed by me and considered in my medical decision making (see chart for details).  Paronychia. Patient given discharge care instruction. Patient given a work note and advised follow-up with PCP. Return to ED if condition worsens.      ____________________________________________   FINAL CLINICAL IMPRESSION(S) / ED DIAGNOSES  Final diagnoses:  Paronychia of finger of right hand      NEW MEDICATIONS STARTED DURING THIS VISIT:  New Prescriptions   IBUPROFEN (ADVIL,MOTRIN) 600 MG TABLET    Take 1 tablet (600 mg total) by mouth every 8 (eight) hours as  needed.   OXYCODONE-ACETAMINOPHEN (ROXICET) 5-325 MG TABLET    Take 1 tablet by mouth every 6 (six) hours as needed for moderate pain.   SULFAMETHOXAZOLE-TRIMETHOPRIM (BACTRIM DS,SEPTRA DS) 800-160 MG TABLET    Take 1 tablet by mouth 2 (two) times daily.     Note:  This document was prepared using Dragon voice recognition software and may include unintentional dictation errors.    Joni Reining, PA-C 07/31/16 1203    Emily Filbert, MD 07/31/16 [id number]

## 2016-08-01 ENCOUNTER — Other Ambulatory Visit: Payer: Self-pay

## 2016-08-01 NOTE — Patient Outreach (Signed)
Outreach patient after ED visit on  07/31/16.  Patient does not have PCP on file but stated he was going to schedule a appointment with PCP.  Could not tell me exactly who his PCP is.  Explained services and asked if he wanted a follow up call, if so I would ask a few questions he said no and I offered to send information about [facility] in the mail and he said please do that.

## 2016-08-06 ENCOUNTER — Telehealth: Payer: Self-pay | Admitting: Medical Oncology

## 2016-08-06 NOTE — Telephone Encounter (Signed)
[address] [facility] has called to get patients rx of bactrim changed d/t pt taking methotrexate. Spoke with Dr Alphonzo Lemmings and d/t rx being written 6 days ago MD is suggesting that pt be re-evaluated.

## 2016-08-10 DIAGNOSIS — K529 Noninfective gastroenteritis and colitis, unspecified: Secondary | ICD-10-CM | POA: Diagnosis not present

## 2016-08-10 DIAGNOSIS — K912 Postsurgical malabsorption, not elsewhere classified: Secondary | ICD-10-CM | POA: Diagnosis not present

## 2016-08-10 DIAGNOSIS — K6389 Other specified diseases of intestine: Secondary | ICD-10-CM | POA: Diagnosis not present

## 2016-08-10 DIAGNOSIS — K9289 Other specified diseases of the digestive system: Secondary | ICD-10-CM | POA: Diagnosis not present

## 2016-08-10 DIAGNOSIS — E538 Deficiency of other specified B group vitamins: Secondary | ICD-10-CM | POA: Diagnosis not present

## 2016-08-31 DIAGNOSIS — M0579 Rheumatoid arthritis with rheumatoid factor of multiple sites without organ or systems involvement: Secondary | ICD-10-CM | POA: Diagnosis not present

## 2016-08-31 DIAGNOSIS — D649 Anemia, unspecified: Secondary | ICD-10-CM | POA: Diagnosis not present

## 2016-09-14 DIAGNOSIS — K640 First degree hemorrhoids: Secondary | ICD-10-CM | POA: Diagnosis not present

## 2016-09-14 DIAGNOSIS — K648 Other hemorrhoids: Secondary | ICD-10-CM | POA: Diagnosis not present

## 2016-09-14 DIAGNOSIS — R197 Diarrhea, unspecified: Secondary | ICD-10-CM | POA: Diagnosis not present

## 2016-09-14 DIAGNOSIS — Z980 Intestinal bypass and anastomosis status: Secondary | ICD-10-CM | POA: Diagnosis not present

## 2016-11-13 DIAGNOSIS — D649 Anemia, unspecified: Secondary | ICD-10-CM | POA: Diagnosis not present

## 2016-11-13 DIAGNOSIS — Z79899 Other long term (current) drug therapy: Secondary | ICD-10-CM | POA: Diagnosis not present

## 2016-11-13 DIAGNOSIS — M0579 Rheumatoid arthritis with rheumatoid factor of multiple sites without organ or systems involvement: Secondary | ICD-10-CM | POA: Diagnosis not present

## 2016-11-13 DIAGNOSIS — D510 Vitamin B12 deficiency anemia due to intrinsic factor deficiency: Secondary | ICD-10-CM | POA: Diagnosis not present

## 2017-02-07 DIAGNOSIS — Z79899 Other long term (current) drug therapy: Secondary | ICD-10-CM | POA: Diagnosis not present

## 2017-02-07 DIAGNOSIS — M0579 Rheumatoid arthritis with rheumatoid factor of multiple sites without organ or systems involvement: Secondary | ICD-10-CM | POA: Diagnosis not present

## 2017-03-10 ENCOUNTER — Other Ambulatory Visit: Payer: Self-pay

## 2017-03-10 ENCOUNTER — Encounter: Payer: Self-pay | Admitting: Emergency Medicine

## 2017-03-10 ENCOUNTER — Emergency Department
Admission: EM | Admit: 2017-03-10 | Discharge: 2017-03-10 | Disposition: A | Discharge status: Home / Self Care | Payer: Medicare Other | Attending: Emergency Medicine | Admitting: Emergency Medicine

## 2017-03-10 DIAGNOSIS — K641 Second degree hemorrhoids: Secondary | ICD-10-CM

## 2017-03-10 DIAGNOSIS — F17210 Nicotine dependence, cigarettes, uncomplicated: Secondary | ICD-10-CM | POA: Diagnosis not present

## 2017-03-10 DIAGNOSIS — K625 Hemorrhage of anus and rectum: Secondary | ICD-10-CM | POA: Diagnosis not present

## 2017-03-10 DIAGNOSIS — Z79899 Other long term (current) drug therapy: Secondary | ICD-10-CM | POA: Diagnosis not present

## 2017-03-10 DIAGNOSIS — K649 Unspecified hemorrhoids: Secondary | ICD-10-CM | POA: Diagnosis present

## 2017-03-10 MED ORDER — HYDROCORTISONE ACETATE 25 MG RE SUPP: 25.0000 mg | Freq: Two times a day (BID) | RECTAL | 1 refills | Status: AC

## 2017-03-10 MED ORDER — LIDOCAINE 5 % EX OINT: 1.0000 "application " | TOPICAL_OINTMENT | CUTANEOUS | 0 refills | Status: DC | PRN

## 2017-03-10 NOTE — ED Notes (Signed)
See triage note  Presents with rectal pain since yesterday Hx of hemorrhoids

## 2017-03-10 NOTE — ED Provider Notes (Signed)
[facility] Emergency Department Provider Note   ____________________________________________   First MD Initiated Contact with Patient 03/10/17 1531     (approximate)  I have reviewed the triage vital signs and the nursing notes.   HISTORY  Chief Complaint Hemorrhoids    HPI Craig Garrett. is a 57 y.o. male patient complaining of hemorrhoidal pain for 2 days.  Patient denies constipation.  States rectal mass protruding will follow bleeding.  Patient rates the pain as a 5/10.  Patient described the pain is "mild achy/itching".  No palates measured for complaint.  Patient has a history of small bowel resection.  Past Medical History:  Diagnosis Date  . Arthritis   . Hepatitis C   . Tinea cruris     Patient Active Problem List   Diagnosis Date Noted  . Nausea & vomiting 03/24/2011  . Dysphagia 03/24/2011  . Sinus tachycardia 02/26/2011  . SVT (supraventricular tachycardia) (HCC) 02/25/2011  . Atypical chest pain 02/25/2011  . History of hepatitis C 02/23/2011  . Small bowel obstruction, partial (HCC) 02/15/2011  . SB mesentery injury 02/07/2011  . Acute blood loss anemia 02/07/2011  . Fracture of right talus 02/07/2011  . Facial laceration 02/06/2011  . Right ankle fracture, lateral malleolus 02/06/2011  . Osteoarthritis 02/06/2011  . Tinea cruris 02/06/2011    Past Surgical History:  Procedure Laterality Date  . APPENDECTOMY  02/06/2011   Procedure: APPENDECTOMY;  Surgeon: Jetty Duhamel, MD;  Location: [facility] OR;  Service: General;  Laterality: N/A;  Exploratory laporatomy, small bowel resection, and incidental appendectomy  . BOWEL RESECTION  02/06/2011   Procedure: SMALL BOWEL RESECTION;  Surgeon: Jetty Duhamel, MD;  Location: [facility] OR;  Service: General;;  Exploratory laporatomy, small bowel resection, and incidental appendectomy  . COLON SURGERY    . ESOPHAGOGASTRODUODENOSCOPY  03/25/2011   Procedure: ESOPHAGOGASTRODUODENOSCOPY (EGD);   Surgeon: Charna Elizabeth, MD;  Location: [facility] ENDOSCOPY;  Service: Endoscopy;  Laterality: N/A;  . LACERATION REPAIR  02/06/2011   Procedure: REPAIR MULTIPLE LACERATIONS;  Surgeon: Darletta Moll, MD;  Location: [facility] OR;  Service: ENT;  Laterality: Right;  . LAPAROTOMY  02/06/2011   Procedure: EXPLORATORY LAPAROTOMY;  Surgeon: Jetty Duhamel, MD;  Location: [facility] OR;  Service: General;  Laterality: N/A;  Exploratory laporatomy, small bowel resection, and incidental appendectomy  . LAPAROTOMY  02/22/2011   Procedure: EXPLORATORY LAPAROTOMY;  Surgeon: Jetty Duhamel, MD;  Location: [facility] OR;  Service: General;  Laterality: N/A;  . Left wrist surgery      Prior to Admission medications   Medication Sig Start Date End Date Taking? Authorizing Provider  hydrocortisone (ANUSOL-HC) 25 MG suppository Place 1 suppository (25 mg total) rectally every 12 (twelve) hours. 03/10/17 03/10/18  Joni Reining, PA-C  ibuprofen (ADVIL,MOTRIN) 600 MG tablet Take 1 tablet (600 mg total) by mouth every 6 (six) hours as needed for moderate pain. 06/05/14   Gayla Doss, MD  ibuprofen (ADVIL,MOTRIN) 600 MG tablet Take 1 tablet (600 mg total) by mouth every 8 (eight) hours as needed. 07/31/16   Joni Reining, PA-C  lidocaine (XYLOCAINE) 5 % ointment Apply 1 application topically as needed. 03/10/17   Joni Reining, PA-C  loperamide (IMODIUM A-D) 2 MG tablet 4mg  initially then 2mg  after every loose stool. Max 16mg /day. 05/04/11   [name], PA-C  metoprolol tartrate (LOPRESSOR) 12.5 mg TABS Take 0.5 tablets (12.5 mg total) by mouth 2 (two) times daily. 03/27/11  [name], PA-C  oxyCODONE-acetaminophen (ROXICET) 5-325 MG tablet Take 1 tablet by mouth every 6 (six) hours as needed for moderate pain. 07/31/16   Joni Reining, PA-C  sulfamethoxazole-trimethoprim (BACTRIM DS,SEPTRA DS) 800-160 MG tablet Take 1 tablet by mouth 2 (two) times daily. 07/31/16   Joni Reining, PA-C  tpn solution (CLINIMIX E 5/20) 5 % SOLN 2,300 mL  with insulin regular 100 units/mL SOLN 15 Units Inject into the vein continuous. 50 ml/hr x 1 hr, then 217 ml/hr x 10 hours, and then 50 ml/hr x 1 hr 03/27/11   [name], PA-C    Allergies Patient has no known allergies.  Family History  Problem Relation Age of Onset  . Hypertension Unknown     Social History Social History   Tobacco Use  . Smoking status: Current Every Day Smoker    Packs/day: 1.00    Years: 15.00    Pack years: 15.00    Types: Cigarettes  . Smokeless tobacco: Former Neurosurgeon    Quit date: 01/06/2011  Substance Use Topics  . Alcohol use: No  . Drug use: No    Review of Systems Constitutional: No fever/chills Eyes: No visual changes. ENT: No sore throat. Cardiovascular: Denies chest pain. Respiratory: Denies shortness of breath. Gastrointestinal: No abdominal pain.  No nausea, no vomiting.  No diarrhea.  No constipation.  Protruding hemorrhoid. Genitourinary: Negative for dysuria. Musculoskeletal: Negative for back pain. Skin: Negative for rash. Neurological: Negative for headaches, focal weakness or numbness.   ____________________________________________   PHYSICAL EXAM:  VITAL SIGNS: ED Triage Vitals [03/10/17 1311]  Enc Vitals Group     BP 134/85     Pulse Rate 71     Resp 18     Temp 98.5 F (36.9 C)     Temp Source Oral     SpO2 97 %     Weight 150 lb (68 kg)     Height 5\' 10"  (1.778 m)     Head Circumference      Peak Flow      Pain Score 5     Pain Loc      Pain Edu?      Excl. in GC?    Constitutional: Alert and oriented. Well appearing and in no acute distress. Cardiovascular: Normal rate, regular rhythm. Grossly normal heart sounds.  Good peripheral circulation. Respiratory: Normal respiratory effort.  No retractions. Lungs CTAB. Gastrointestinal: Soft and nontender. No distention. No abdominal bruits. No CVA tenderness.  Small nonthrombosed hemorrhoidal tissue 6 o'clock position of the rectus. Psychiatric: Mood and  affect are normal. Speech and behavior are normal.  ____________________________________________   LABS (all labs ordered are listed, but only abnormal results are displayed)  Labs Reviewed - No data to display ____________________________________________  EKG   ____________________________________________  RADIOLOGY  ED MD interpretation:    Official radiology report(s): No results found.  ____________________________________________   PROCEDURES  Procedure(s) performed: None  Procedures  Critical Care performed: No  ____________________________________________   INITIAL IMPRESSION / ASSESSMENT AND PLAN / ED COURSE  As part of my medical decision making, I reviewed the following data within the electronic MEDICAL RECORD NUMBER    Small nonthrombosed hemorrhoidal tissue.  Patient given discharge care instructions.  Patient was given Anusol and topical lidocaine.  Patient advised to follow-up with the [facility] if no improvement in 1 week.  Return to the ED if condition worsens or bleeding is noted.      ____________________________________________  FINAL CLINICAL IMPRESSION(S) / ED DIAGNOSES  Final diagnoses:  Grade II hemorrhoids     ED Discharge Orders        Ordered    hydrocortisone (ANUSOL-HC) 25 MG suppository  Every 12 hours     03/10/17 1537    lidocaine (XYLOCAINE) 5 % ointment  As needed     03/10/17 1537       Note:  This document was prepared using Dragon voice recognition software and may include unintentional dictation errors.    Joni Reining, PA-C 03/10/17 1544    Nita Sickle, MD 03/10/17 Corky Crafts

## 2017-03-10 NOTE — ED Triage Notes (Signed)
States noted hemorrhoids tender and painful since yesterday.

## 2017-03-13 ENCOUNTER — Other Ambulatory Visit: Payer: Self-pay

## 2017-03-28 DIAGNOSIS — D649 Anemia, unspecified: Secondary | ICD-10-CM | POA: Diagnosis not present

## 2017-03-28 DIAGNOSIS — Z79899 Other long term (current) drug therapy: Secondary | ICD-10-CM | POA: Diagnosis not present

## 2017-03-28 DIAGNOSIS — D510 Vitamin B12 deficiency anemia due to intrinsic factor deficiency: Secondary | ICD-10-CM | POA: Diagnosis not present

## 2017-03-28 DIAGNOSIS — M0579 Rheumatoid arthritis with rheumatoid factor of multiple sites without organ or systems involvement: Secondary | ICD-10-CM | POA: Diagnosis not present

## 2017-08-10 DIAGNOSIS — M0579 Rheumatoid arthritis with rheumatoid factor of multiple sites without organ or systems involvement: Secondary | ICD-10-CM | POA: Diagnosis not present

## 2017-08-10 DIAGNOSIS — Z79899 Other long term (current) drug therapy: Secondary | ICD-10-CM | POA: Diagnosis not present

## 2017-08-13 DIAGNOSIS — M0579 Rheumatoid arthritis with rheumatoid factor of multiple sites without organ or systems involvement: Secondary | ICD-10-CM | POA: Diagnosis not present

## 2017-08-13 DIAGNOSIS — Z79899 Other long term (current) drug therapy: Secondary | ICD-10-CM | POA: Diagnosis not present

## 2017-08-13 DIAGNOSIS — D649 Anemia, unspecified: Secondary | ICD-10-CM | POA: Diagnosis not present

## 2017-08-13 DIAGNOSIS — D510 Vitamin B12 deficiency anemia due to intrinsic factor deficiency: Secondary | ICD-10-CM | POA: Diagnosis not present

## 2017-08-22 ENCOUNTER — Emergency Department: Payer: Medicare Other

## 2017-08-22 ENCOUNTER — Other Ambulatory Visit: Payer: Self-pay

## 2017-08-22 ENCOUNTER — Emergency Department
Admission: EM | Admit: 2017-08-22 | Discharge: 2017-08-22 | Disposition: A | Discharge status: Home / Self Care | Payer: Medicare Other | Attending: Emergency Medicine | Admitting: Emergency Medicine

## 2017-08-22 DIAGNOSIS — F17210 Nicotine dependence, cigarettes, uncomplicated: Secondary | ICD-10-CM | POA: Insufficient documentation

## 2017-08-22 DIAGNOSIS — M791 Myalgia, unspecified site: Secondary | ICD-10-CM | POA: Diagnosis not present

## 2017-08-22 DIAGNOSIS — R51 Headache: Secondary | ICD-10-CM | POA: Diagnosis not present

## 2017-08-22 DIAGNOSIS — Z79899 Other long term (current) drug therapy: Secondary | ICD-10-CM | POA: Diagnosis not present

## 2017-08-22 DIAGNOSIS — M7918 Myalgia, other site: Secondary | ICD-10-CM

## 2017-08-22 DIAGNOSIS — M542 Cervicalgia: Secondary | ICD-10-CM | POA: Insufficient documentation

## 2017-08-22 DIAGNOSIS — S0990XA Unspecified injury of head, initial encounter: Secondary | ICD-10-CM | POA: Diagnosis not present

## 2017-08-22 DIAGNOSIS — S199XXA Unspecified injury of neck, initial encounter: Secondary | ICD-10-CM | POA: Diagnosis not present

## 2017-08-22 MED ORDER — CYCLOBENZAPRINE HCL 5 MG PO TABS: ORAL_TABLET | ORAL | 0 refills | Status: DC

## 2017-08-22 MED ORDER — LIDOCAINE 5 % EX PTCH: 1.0000 | MEDICATED_PATCH | CUTANEOUS | 0 refills | Status: DC

## 2017-08-22 MED ORDER — MELOXICAM 15 MG PO TABS: 15.0000 mg | ORAL_TABLET | Freq: Every day | ORAL | 0 refills | Status: AC
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
## 2017-08-23 ENCOUNTER — Other Ambulatory Visit: Payer: Self-pay

## 2018-02-06 DIAGNOSIS — Z79899 Other long term (current) drug therapy: Secondary | ICD-10-CM | POA: Diagnosis not present

## 2018-02-06 DIAGNOSIS — M0579 Rheumatoid arthritis with rheumatoid factor of multiple sites without organ or systems involvement: Secondary | ICD-10-CM | POA: Diagnosis not present

## 2018-02-08 DIAGNOSIS — Z7689 Persons encountering health services in other specified circumstances: Secondary | ICD-10-CM | POA: Diagnosis not present

## 2018-02-13 DIAGNOSIS — Z79899 Other long term (current) drug therapy: Secondary | ICD-10-CM | POA: Diagnosis not present

## 2018-02-13 DIAGNOSIS — D510 Vitamin B12 deficiency anemia due to intrinsic factor deficiency: Secondary | ICD-10-CM | POA: Diagnosis not present

## 2018-02-13 DIAGNOSIS — M8588 Other specified disorders of bone density and structure, other site: Secondary | ICD-10-CM | POA: Diagnosis not present

## 2018-02-13 DIAGNOSIS — M25571 Pain in right ankle and joints of right foot: Secondary | ICD-10-CM | POA: Diagnosis not present

## 2018-02-13 DIAGNOSIS — G8929 Other chronic pain: Secondary | ICD-10-CM | POA: Diagnosis not present

## 2018-02-13 DIAGNOSIS — M0579 Rheumatoid arthritis with rheumatoid factor of multiple sites without organ or systems involvement: Secondary | ICD-10-CM | POA: Diagnosis not present

## 2018-04-09 DIAGNOSIS — D649 Anemia, unspecified: Secondary | ICD-10-CM | POA: Diagnosis not present

## 2018-04-09 DIAGNOSIS — M19171 Post-traumatic osteoarthritis, right ankle and foot: Secondary | ICD-10-CM | POA: Diagnosis not present

## 2018-04-09 DIAGNOSIS — M0579 Rheumatoid arthritis with rheumatoid factor of multiple sites without organ or systems involvement: Secondary | ICD-10-CM | POA: Diagnosis not present

## 2018-04-09 DIAGNOSIS — M79671 Pain in right foot: Secondary | ICD-10-CM | POA: Diagnosis not present

## 2018-04-09 DIAGNOSIS — Z7689 Persons encountering health services in other specified circumstances: Secondary | ICD-10-CM | POA: Diagnosis not present

## 2018-04-26 DIAGNOSIS — M25571 Pain in right ankle and joints of right foot: Secondary | ICD-10-CM | POA: Diagnosis not present

## 2018-04-26 DIAGNOSIS — Z7689 Persons encountering health services in other specified circumstances: Secondary | ICD-10-CM | POA: Diagnosis not present

## 2018-04-26 DIAGNOSIS — K912 Postsurgical malabsorption, not elsewhere classified: Secondary | ICD-10-CM | POA: Diagnosis not present

## 2018-04-26 DIAGNOSIS — M0579 Rheumatoid arthritis with rheumatoid factor of multiple sites without organ or systems involvement: Secondary | ICD-10-CM | POA: Diagnosis not present

## 2018-04-26 DIAGNOSIS — Z720 Tobacco use: Secondary | ICD-10-CM | POA: Diagnosis not present

## 2018-05-14 DIAGNOSIS — M810 Age-related osteoporosis without current pathological fracture: Secondary | ICD-10-CM | POA: Diagnosis not present

## 2018-05-14 DIAGNOSIS — M0579 Rheumatoid arthritis with rheumatoid factor of multiple sites without organ or systems involvement: Secondary | ICD-10-CM | POA: Diagnosis not present

## 2018-05-14 DIAGNOSIS — Z79899 Other long term (current) drug therapy: Secondary | ICD-10-CM | POA: Diagnosis not present

## 2018-06-20 DIAGNOSIS — R0789 Other chest pain: Secondary | ICD-10-CM | POA: Diagnosis not present

## 2018-06-20 DIAGNOSIS — K219 Gastro-esophageal reflux disease without esophagitis: Secondary | ICD-10-CM | POA: Diagnosis not present

## 2018-06-27 DIAGNOSIS — R1310 Dysphagia, unspecified: Secondary | ICD-10-CM | POA: Diagnosis not present

## 2018-06-27 DIAGNOSIS — K219 Gastro-esophageal reflux disease without esophagitis: Secondary | ICD-10-CM | POA: Diagnosis not present

## 2018-06-27 DIAGNOSIS — K529 Noninfective gastroenteritis and colitis, unspecified: Secondary | ICD-10-CM | POA: Diagnosis not present

## 2018-07-22 DIAGNOSIS — M810 Age-related osteoporosis without current pathological fracture: Secondary | ICD-10-CM | POA: Diagnosis not present

## 2018-07-22 DIAGNOSIS — R7989 Other specified abnormal findings of blood chemistry: Secondary | ICD-10-CM | POA: Diagnosis not present

## 2018-08-13 ENCOUNTER — Telehealth: Payer: Self-pay

## 2018-08-13 NOTE — Telephone Encounter (Signed)
Note °  °Per [employer], we left message with the patient requesting the information of their PCP, as we are incorrectly listed as the PCP. In addition, we asked the patient to also inform their insurance as to who their current PCP is. We asked the patient to please call us back  °  ° ° °

## 2018-09-05 DIAGNOSIS — M0579 Rheumatoid arthritis with rheumatoid factor of multiple sites without organ or systems involvement: Secondary | ICD-10-CM | POA: Diagnosis not present

## 2018-09-05 DIAGNOSIS — Z79899 Other long term (current) drug therapy: Secondary | ICD-10-CM | POA: Diagnosis not present

## 2018-09-19 DIAGNOSIS — Z79899 Other long term (current) drug therapy: Secondary | ICD-10-CM | POA: Diagnosis not present

## 2018-09-19 DIAGNOSIS — E538 Deficiency of other specified B group vitamins: Secondary | ICD-10-CM | POA: Diagnosis not present

## 2018-09-19 DIAGNOSIS — M0579 Rheumatoid arthritis with rheumatoid factor of multiple sites without organ or systems involvement: Secondary | ICD-10-CM | POA: Diagnosis not present

## 2018-09-19 DIAGNOSIS — D510 Vitamin B12 deficiency anemia due to intrinsic factor deficiency: Secondary | ICD-10-CM | POA: Diagnosis not present

## 2018-09-19 DIAGNOSIS — M25571 Pain in right ankle and joints of right foot: Secondary | ICD-10-CM | POA: Diagnosis not present

## 2018-09-19 DIAGNOSIS — G8929 Other chronic pain: Secondary | ICD-10-CM | POA: Diagnosis not present

## 2018-10-07 DIAGNOSIS — E538 Deficiency of other specified B group vitamins: Secondary | ICD-10-CM | POA: Diagnosis not present

## 2019-02-03 ENCOUNTER — Encounter: Admit: 2019-02-03 | Payer: PRIVATE HEALTH INSURANCE | Attending: Family

## 2019-02-03 DIAGNOSIS — Z01818 Encounter for other preprocedural examination: Secondary | ICD-10-CM

## 2019-02-05 ENCOUNTER — Ambulatory Visit: Admit: 2019-02-05 | Payer: PRIVATE HEALTH INSURANCE

## 2019-02-11 ENCOUNTER — Inpatient Hospital Stay: Admit: 2019-02-11 | Discharge: 2019-02-11 | Payer: MEDICAID

## 2019-02-11 DIAGNOSIS — Z01812 Encounter for preprocedural laboratory examination: Secondary | ICD-10-CM

## 2019-02-11 DIAGNOSIS — Z01818 Encounter for other preprocedural examination: Secondary | ICD-10-CM

## 2019-02-11 DIAGNOSIS — U071 COVID-19: Secondary | ICD-10-CM

## 2019-02-11 LAB — COVID-19 CLEARANCE OR FOR PLACEMENT ONLY: BKR SARS-COV-2 RNA (COVID-19) (YH): POSITIVE — AB

## 2019-02-12 ENCOUNTER — Telehealth: Admit: 2019-02-12 | Payer: PRIVATE HEALTH INSURANCE | Attending: Gastroenterology

## 2019-02-12 ENCOUNTER — Ambulatory Visit: Admit: 2019-02-12 | Payer: PRIVATE HEALTH INSURANCE

## 2019-02-12 ENCOUNTER — Encounter: Admit: 2019-02-12 | Payer: PRIVATE HEALTH INSURANCE | Attending: Certified Registered"

## 2019-02-12 NOTE — Telephone Encounter
Attempted to call patient to inform him that he is COVID-19 positive.  I left a meesage to call [phone].

## 2019-02-12 NOTE — Telephone Encounter
Smart web to Dr Conception Chancy notifying pt tested covid positive 2-9.

## 2019-02-12 NOTE — Telephone Encounter
Patient diagnosed with COVID-19 after swab for procedure.  I discussed with patient and asked him to call his PCP.  I will cc them on this note.  Patient has no symptoms at this time.  Needs to be rescheduled for colonoscopy.

## 2019-02-12 NOTE — Telephone Encounter
Patient calling FYI. Positive COVID. He spoke to Colonoscopy Dept. They will can. Apt. Ty.

## 2019-02-14 ENCOUNTER — Encounter: Admit: 2019-02-14 | Payer: PRIVATE HEALTH INSURANCE | Attending: Certified Registered"

## 2019-02-19 ENCOUNTER — Encounter
Admit: 2019-02-19 | Payer: PRIVATE HEALTH INSURANCE | Attending: Student in an Organized Health Care Education/Training Program

## 2019-02-19 DIAGNOSIS — Z1211 Encounter for screening for malignant neoplasm of colon: Secondary | ICD-10-CM

## 2019-02-19 MED ORDER — PEG 3350-ELECTROLYTES 236 GRAM-22.74 GRAM-6.74 GRAM-5.86 GRAM SOLUTION
Freq: Once | ORAL | 1 refills | Status: AC
Start: 2019-02-19 — End: ?

## 2019-02-20 ENCOUNTER — Encounter: Admit: 2019-02-20 | Payer: MEDICAID

## 2019-02-20 ENCOUNTER — Encounter: Admit: 2019-02-20 | Payer: MEDICAID | Attending: Ophthalmology

## 2019-02-21 ENCOUNTER — Telehealth: Admit: 2019-02-21 | Payer: PRIVATE HEALTH INSURANCE | Attending: Foot & Ankle Surgery

## 2019-02-21 NOTE — Telephone Encounter
Patient can come for scheduled appointment on 2/24 if not having any Covid symptoms.

## 2019-02-21 NOTE — Telephone Encounter
Patient informed as noted.

## 2019-02-21 NOTE — Telephone Encounter
Patient had Covid test on 02/09 they told him to quarantine for 10 days. Today will be the tenth day wants to know if he has to get retested or would he just come in for his appt on 02/24

## 2019-02-26 ENCOUNTER — Ambulatory Visit: Admit: 2019-02-26 | Payer: PRIVATE HEALTH INSURANCE

## 2019-03-04 ENCOUNTER — Encounter: Admit: 2019-03-04 | Payer: PRIVATE HEALTH INSURANCE | Attending: Orthopedic Surgery

## 2019-03-04 ENCOUNTER — Institutional Professional Consult (permissible substitution): Admit: 2019-03-04 | Payer: MEDICAID | Attending: Orthopedic Surgery

## 2019-03-04 DIAGNOSIS — G959 Disease of spinal cord, unspecified: Secondary | ICD-10-CM

## 2019-03-04 DIAGNOSIS — I10 Essential (primary) hypertension: Secondary | ICD-10-CM

## 2019-03-04 DIAGNOSIS — D126 Benign neoplasm of colon, unspecified: Secondary | ICD-10-CM

## 2019-03-04 DIAGNOSIS — M25569 Pain in unspecified knee: Secondary | ICD-10-CM

## 2019-03-04 MED ORDER — HYLAN G-F 20  48 MG/6 ML INTRA-ARTICULAR SYRINGE
48 mg/6 mL | Freq: Once | INTRA_ARTICULAR | Status: CP
Start: 2019-03-04 — End: ?
  Administered 2019-03-04: 20:00:00 48 mL via INTRA_ARTICULAR

## 2019-03-04 NOTE — Progress Notes
Subjective:   Billy Nielsen is a 59 y.o. male  who had concerns including Joint Pain.HPI New patient consultation for left knee pain.  Patient has been evaluated for years for his left knee osteoarthritis.  Reportedly saw doctors at [facility] in [place] were conservatively managing his osteoarthritis of the left knee.  He recently underwent lumbar spine surgery and since he has been recovering has noticed that the pain in his left knee has gotten worse.  He has severe pain and stiffness in the morning which improves throughout the day.  He has occasional swelling about the knee.  He has weakness in the quadriceps stating that his knee gives out on him from time to time.  He has been taking nonsteroidal anti-inflammatories off and on.  Currently anti-inflammatories or offer him no relief.  He has not had any injections in his knees before.  He had physical therapy after his spine surgery and still does at-home exercises.  He walks with a cane.[name]  has a past medical history of Cervical myelopathy (HC Code) (2020), Hypertension, and Tubular adenoma of colon.Billy Nielsen Past Surgical History: Procedure Laterality Date ? ANKLE FRACTURE SURGERY Left  ? CERVICAL DISCECTOMY  02/2018 ? LUMBAR DISCECTOMY  02/2018  neck  Review of SystemsReviewed and updated is the past medical history, past surgical history, and past social history.  They are as follows: He  has a past medical history of Cervical myelopathy (HC Code) (2020), Hypertension, and Tubular adenoma of colon.He  has a past surgical history that includes Ankle fracture surgery (Left); Lumbar discectomy (02/2018); and Cervical discectomy (02/2018).His family history is not on file.Social History Occupational History ? Not on file Tobacco Use ? Smoking status: Current Every Day Smoker   Packs/day: 0.25   Types: Cigarettes ? Smokeless tobacco: Never Used Substance and Sexual Activity ? Alcohol use: Yes Comment:  a beer and shot three times a week ? Drug use: No ? Sexual activity: Not on file Allergies: Patient has no known allergies.Reviewed and updated is the medication list:He has a current medication list which includes the following prescription(s): diclofenac and naproxen sodium.  Objective:  Exam: Billy Nielsen's vital signs are: BP 139/87  - Pulse 78  - Temp 97.5 ?F (36.4 ?C)  - Ht 6' 1 (1.854 m)  - Wt 91.6 kg  - BMI 26.64 kg/m? Left Knee Exam Muscle Strength The patient has normal left knee strength.Tenderness The patient is experiencing tenderness in the lateral joint line.Range of Motion Extension: normal Flexion: abnormal Tests McMurray:  Lateral - positiveLachman:  Anterior - negative    Drawer:  Posterior - negativeOther Erythema: absentScars: absentSensation: normalPulse: presentSwelling: mildEffusion: effusion presentPhysical ExamMusculoskeletal:    Left knee: He exhibits effusion. Imaging: X-rays of the left knee obtained 6 months ago were reviewed.  There is extensive degenerative disease with bone-on-bone arthritis in the lateral compartment with medial joint space narrowing.  There are marginal osteophytes and subchondral cysts.  There is a mild genu valgus appearance to the knee.  Assessment / Plan:  We discussed the diagnosis of left knee osteoarthritis.  We discussed the conservative management of this problem.  Up to this point patient has had a good course of conservative treatment which is now failing to control his symptoms.  We discussed about joint injections including cortisone and viscosupplementation.  Given the risks and benefits of each type of injection the patient elected to proceed with a viscosupplementation injection.  After obtaining consent and preparing the patient's knee in a standard  precaution fashion the Synvisc-One injection was placed in the inferior lateral portal of the knee.  Patient tolerated the injection well.  Noted some fullness after injection.  We discussed what to watch out for post injection as far as pain infection.  Patient will follow up with [place] in 3 months to evaluate the response to the injection and consider alternative measures of treatment.  All questions answered.Discussion was had with the patient regarding diagnosis and differential diagnosis.  Risks and benefits of all treatment options were discussed.  All questions were answered. Electronically signed by: Cherlyn Labella, MD

## 2019-03-05 DIAGNOSIS — M1712 Unilateral primary osteoarthritis, left knee: Secondary | ICD-10-CM

## 2019-03-07 ENCOUNTER — Encounter: Admit: 2019-03-07 | Payer: PRIVATE HEALTH INSURANCE

## 2019-03-07 ENCOUNTER — Ambulatory Visit: Admit: 2019-03-07 | Payer: MEDICAID | Attending: Primary Podiatric Medicine

## 2019-03-07 DIAGNOSIS — G5762 Lesion of plantar nerve, left lower limb: Secondary | ICD-10-CM

## 2019-03-07 DIAGNOSIS — D126 Benign neoplasm of colon, unspecified: Secondary | ICD-10-CM

## 2019-03-07 DIAGNOSIS — G959 Disease of spinal cord, unspecified: Secondary | ICD-10-CM

## 2019-03-07 DIAGNOSIS — I10 Essential (primary) hypertension: Secondary | ICD-10-CM

## 2019-03-07 MED ORDER — DEXAMETHASONE SODIUM PHOSPHATE 4 MG/ML INJECTION SOLUTION
4 mg/mL | Freq: Once | INTRAMUSCULAR | Status: CP
Start: 2019-03-07 — End: ?
  Administered 2019-03-07: 21:00:00 4 mL via INTRAMUSCULAR

## 2019-03-07 MED ORDER — DEXAMETHASONE SODIUM PHOSPHATE 4 MG/ML INJECTION SOLUTION
4 mg/mL | Freq: Once | INTRAMUSCULAR | Status: DC
Start: 2019-03-07 — End: 2019-03-07

## 2019-03-08 DIAGNOSIS — B351 Tinea unguium: Secondary | ICD-10-CM

## 2019-03-08 DIAGNOSIS — M1712 Unilateral primary osteoarthritis, left knee: Secondary | ICD-10-CM

## 2019-03-08 DIAGNOSIS — M2011 Hallux valgus (acquired), right foot: Secondary | ICD-10-CM

## 2019-03-08 DIAGNOSIS — M2041 Other hammer toe(s) (acquired), right foot: Secondary | ICD-10-CM

## 2019-03-08 DIAGNOSIS — M21372 Foot drop, left foot: Secondary | ICD-10-CM

## 2019-03-08 DIAGNOSIS — M2012 Hallux valgus (acquired), left foot: Secondary | ICD-10-CM

## 2019-03-08 DIAGNOSIS — M2042 Other hammer toe(s) (acquired), left foot: Secondary | ICD-10-CM

## 2019-03-09 NOTE — Progress Notes
Subjective:58 yo patient who returns to clinic for left foot weakness and right sided numbness follow up. Patient admits that the steroid injection for the Morton's neuroma gave him some relief, but the area is still painful with ambulation. He was seen in the orthopedic clinic a few days ago for Osteoarthritis of the left knee and was given an injection as well. He admits that the left foot weakness has improved since. He has not yet contacted [facility] for the AFO. Patient denies being diabetic. Denies N/V/F/C/SOB/CPReason for visit:  Routine follow-up Review of Systems Constitutional: Negative for fever. HENT: Negative for congestion.  Eyes: Negative for redness. Respiratory: Negative for shortness of breath.  Cardiovascular: Negative for leg swelling. Gastrointestinal: Negative for nausea and vomiting. Musculoskeletal: Negative for falls. Skin: Negative for rash. Neurological: Positive for weakness.  Objective: Physical Exam:General: Patient is NAD and AAO X 3. Appropriate. Ambulating with a cane.VASCULAR: -Palpable dorsalis pedis bilaterally, Palpable posterior tibial bilaterally.-Capillary refill: Intact-Temperature Gradient: WNL-Hair growth: absentNEUROLOGICAL: -Light touch sensation intact to the LE bilaterallyMUSCULOSKELETAL: - Muscle strength: 5/5 to dorsiflexion, plantarflexion, inversion and eversion for bilateral lower extremity.- Decreased ROM without pain or crepitus at the ankle joint, STJ, MTPJs bilaterally- Valgus deformity of the hallux left greater than right.- Contraction of digits 2-5 with stance phase.- Pain on palpation of distal 4th digits. - Pain on palpation of the left 3rd distal intermetatarsal space.Dermatological: - Nails on digits 1-5 bilaterally, noted to be wnl.- No open lesions or abrasions- Warm, dry and supple- Interdigital maceration absentAssessment Assessment/Plan:Morton's neuroma, left footOnychomycosisHallux valgus, bilateralDrop foot, leftSemi-rigid hammer toe, bilateral- Patient seen and evaluated in the clinic. Dr. Kirt Boys reviewed the chart and the plan.- Discussed all clinical findings and answered all patient's question to satisfaction.- Discussed conservative vs surgical treatments for Morton's neuroma. Explained to patient that he can received a series of 3 injections including the one that he received last January. - Patient would like to proceed with another steroid injection for Morton's neuroma today- Left 3rd distal intermetatarsal space was prepped in the aseptic manner. The area was injected with a mixture of 1cc 0.25% marcaine plain and 1cc of dexamethasone. A band-aid was applied. Patient tolerated the injection well and was instructed to monitor for post injection complications such as pain, erythema, or discharge and to contact the office or go to the emergency room.-His next option would likely be surgical intervention if no major relief from the injections. - Patient to return to the clinic in 2 weeks for Morton's neuroma injection #3. Lonna Duval, PGY-1Electronically Signed by Sarajane Marek, DPM, March 07, 2019

## 2019-03-15 ENCOUNTER — Ambulatory Visit
Admit: 2019-03-15 | Payer: PRIVATE HEALTH INSURANCE | Attending: Student in an Organized Health Care Education/Training Program

## 2019-03-18 ENCOUNTER — Telehealth: Admit: 2019-03-18 | Payer: PRIVATE HEALTH INSURANCE

## 2019-03-18 NOTE — Telephone Encounter
NEMT VEYO form faxed and left to be scanned into medical record.  Cassell Clement, [id number]) [phone]/16/2021

## 2019-03-21 ENCOUNTER — Telehealth
Admit: 2019-03-21 | Payer: PRIVATE HEALTH INSURANCE | Attending: Student in an Organized Health Care Education/Training Program

## 2019-03-21 ENCOUNTER — Ambulatory Visit: Admit: 2019-03-21 | Payer: PRIVATE HEALTH INSURANCE

## 2019-03-21 NOTE — Telephone Encounter
Per 03/18/2019 encounter VEYO was completed.

## 2019-03-21 NOTE — Telephone Encounter
Pt dropped off veyo forms, no receipt scanned can you please check and see if this was done

## 2019-03-22 ENCOUNTER — Ambulatory Visit
Admit: 2019-03-22 | Payer: PRIVATE HEALTH INSURANCE | Attending: Student in an Organized Health Care Education/Training Program

## 2019-03-23 DIAGNOSIS — Z23 Encounter for immunization: Secondary | ICD-10-CM

## 2019-03-25 ENCOUNTER — Ambulatory Visit
Admit: 2019-03-25 | Payer: PRIVATE HEALTH INSURANCE | Attending: Student in an Organized Health Care Education/Training Program

## 2019-04-04 ENCOUNTER — Ambulatory Visit: Admit: 2019-04-04 | Payer: PRIVATE HEALTH INSURANCE

## 2019-04-10 ENCOUNTER — Telehealth: Admit: 2019-04-10 | Payer: PRIVATE HEALTH INSURANCE | Attending: Ophthalmology

## 2019-04-10 NOTE — Telephone Encounter
FYI - Pt has moved out of the area and would like to cancel any and all upcomming appts.  Thank you for your service.

## 2019-04-18 ENCOUNTER — Ambulatory Visit: Admit: 2019-04-18 | Payer: PRIVATE HEALTH INSURANCE | Attending: Internal Medicine

## 2019-04-19 ENCOUNTER — Ambulatory Visit: Admit: 2019-04-19 | Payer: PRIVATE HEALTH INSURANCE

## 2019-04-20 DIAGNOSIS — Z23 Encounter for immunization: Secondary | ICD-10-CM

## 2019-06-10 ENCOUNTER — Ambulatory Visit: Admit: 2019-06-10 | Payer: PRIVATE HEALTH INSURANCE

## 2019-08-26 DIAGNOSIS — M8580 Other specified disorders of bone density and structure, unspecified site: Secondary | ICD-10-CM | POA: Insufficient documentation

## 2019-08-27 ENCOUNTER — Telehealth: Payer: Self-pay | Admitting: *Deleted

## 2019-08-27 ENCOUNTER — Encounter: Payer: Self-pay | Admitting: *Deleted

## 2019-08-27 DIAGNOSIS — Z122 Encounter for screening for malignant neoplasm of respiratory organs: Secondary | ICD-10-CM

## 2019-08-27 DIAGNOSIS — Z87891 Personal history of nicotine dependence: Secondary | ICD-10-CM

## 2019-08-27 NOTE — Telephone Encounter (Signed)
Received referral for initial lung cancer screening scan. Contacted patient and obtained smoking history,(current, 105 pack year) as well as answering questions related to screening process. Patient denies signs of lung cancer such as weight loss or hemoptysis. Patient denies comorbidity that would prevent curative treatment if lung cancer were found. Patient is scheduled for shared decision making visit and CT scan on 09/03/19 at 1045am.

## 2019-09-03 ENCOUNTER — Inpatient Hospital Stay: Payer: Medicare Other | Attending: Oncology | Admitting: Oncology

## 2019-09-03 ENCOUNTER — Ambulatory Visit
Admission: RE | Admit: 2019-09-03 | Discharge: 2019-09-03 | Disposition: A | Discharge status: Home / Self Care | Payer: Medicare Other | Source: Ambulatory Visit | Attending: Oncology | Admitting: Oncology

## 2019-09-03 ENCOUNTER — Other Ambulatory Visit: Payer: Self-pay

## 2019-09-03 DIAGNOSIS — Z122 Encounter for screening for malignant neoplasm of respiratory organs: Secondary | ICD-10-CM | POA: Insufficient documentation

## 2019-09-03 DIAGNOSIS — Z87891 Personal history of nicotine dependence: Secondary | ICD-10-CM

## 2019-09-03 NOTE — Progress Notes (Signed)
Virtual Visit via Video Note  I connected with Craig Garrett on 09/03/19 at 10:45 AM EDT by a video enabled telemedicine application and verified that I am speaking with the correct person using two identifiers.  Location: Patient: [facility] Provider: Clinic    I discussed the limitations of evaluation and management by telemedicine and the availability of in person appointments. The patient expressed understanding and agreed to proceed.  I discussed the assessment and treatment plan with the patient. The patient was provided an opportunity to ask questions and all were answered. The patient agreed with the plan and demonstrated an understanding of the instructions.   The patient was advised to call back or seek an in-person evaluation if the symptoms worsen or if the condition fails to improve as anticipated.   In accordance with CMS guidelines, patient has met eligibility criteria including age, absence of signs or symptoms of lung cancer.  Social History   Tobacco Use  . Smoking status: Current Every Day Smoker    Packs/day: 2.50    Years: 42.00    Pack years: 105.00    Types: Cigarettes  . Smokeless tobacco: Former Systems developer    Quit date: 01/06/2011  . Tobacco comment: .25ppd currently  Substance Use Topics  . Alcohol use: No  . Drug use: No      A shared decision-making session was conducted prior to the performance of CT scan. This includes one or more decision aids, includes benefits and harms of screening, follow-up diagnostic testing, over-diagnosis, false positive rate, and total radiation exposure.   Counseling on the importance of adherence to annual lung cancer LDCT screening, impact of co-morbidities, and ability or willingness to undergo diagnosis and treatment is imperative for compliance of the program.   Counseling on the importance of continued smoking cessation for former smokers; the importance of smoking cessation for current smokers, and information about tobacco cessation  interventions have been given to patient including [place] and 1800 quit [place] programs.   Written order for lung cancer screening with LDCT has been given to the patient and any and all questions have been answered to the best of my abilities.    Yearly follow up will be coordinated by Burgess Estelle, Thoracic Navigator.  I provided 15 minutes of face to face video encounter, and > 50% was spent counseling as documented under my assessment & plan.   Jacquelin Hawking, NP

## 2019-09-11 ENCOUNTER — Encounter: Payer: Self-pay | Admitting: *Deleted

## 2020-04-28 DIAGNOSIS — J432 Centrilobular emphysema: Secondary | ICD-10-CM | POA: Insufficient documentation

## 2020-04-28 DIAGNOSIS — I700 Atherosclerosis of aorta: Secondary | ICD-10-CM | POA: Insufficient documentation

## 2020-05-10 ENCOUNTER — Encounter: Payer: Self-pay | Admitting: Urology

## 2020-05-10 ENCOUNTER — Ambulatory Visit (INDEPENDENT_AMBULATORY_CARE_PROVIDER_SITE_OTHER): Payer: Medicare Other | Admitting: Urology

## 2020-05-10 ENCOUNTER — Other Ambulatory Visit: Payer: Self-pay

## 2020-05-10 VITALS — BP 118/70 | HR 77 | Ht 69.0 in | Wt 139.0 lb

## 2020-05-10 DIAGNOSIS — R972 Elevated prostate specific antigen [PSA]: Secondary | ICD-10-CM | POA: Diagnosis not present
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
## 2020-05-10 NOTE — Patient Instructions (Addendum)
Prostate Biopsy Instructions  Stop all aspirin or blood thinners (aspirin, plavix, coumadin, warfarin, motrin, ibuprofen, advil, aleve, naproxen, naprosyn) for 7 days prior to the procedure.  If you have any questions about stopping these medications, please contact your primary care physician or cardiologist.  Having a light meal prior to the procedure is recommended.  If you are diabetic or have low blood sugar please bring a small snack or glucose tablet.  A Fleets enema is needed to be purchased over the counter at a local pharmacy and used 2 hours before you scheduled appointment.  This can be purchased over the counter at any pharmacy.  Antibiotics will be administered in the clinic at the time of the procedure unless otherwise specified.    Please bring someone with you to the procedure to drive you home.  A follow up appointment has been scheduled for you to receive the results of the biopsy.  If you have any questions or concerns, please feel free to call the office at [phone] or send a Mychart message.    Thank you, Staff at [facility]   Prostate Cancer Screening  Prostate cancer screening is a test that is done to check for the presence of prostate cancer in men. The prostate gland is a walnut-sized gland that is located below the bladder and in front of the rectum in males. The function of the prostate is to add fluid to semen during ejaculation. Prostate cancer is the second most common type of cancer in men. Who should have prostate cancer screening?  Screening recommendations vary based on age and other risk factors. Screening is recommended if:  You are older than age 55. If you are age 55-69, talk with your health care provider about your need for screening and how often screening should be done. Because most prostate cancers are slow growing and will not cause death, screening is generally reserved in this age group for men who have a 10-15-year life  expectancy.  You are younger than age 55, and you have these risk factors: ? Being a black male or a male of African descent. ? Having a father, brother, or uncle who has been diagnosed with prostate cancer. The risk is higher if your family member's cancer occurred at an early age. Screening is not recommended if:  You are younger than age 40.  You are between the ages of 40 and 54 and you have no risk factors.  You are 70 years of age or older. At this age, the risks that screening can cause are greater than the benefits that it may provide. If you are at high risk for prostate cancer, your health care provider may recommend that you have screenings more often or that you start screening at a younger age. How is screening for prostate cancer done? The recommended prostate cancer screening test is a blood test called the prostate-specific antigen (PSA) test. PSA is a protein that is made in the prostate. As you age, your prostate naturally produces more PSA. Abnormally high PSA levels may be caused by:  Prostate cancer.  An enlarged prostate that is not caused by cancer (benign prostatic hyperplasia, BPH). This condition is very common in older men.  A prostate gland infection (prostatitis). Depending on the PSA results, you may need more tests, such as:  A physical exam to check the size of your prostate gland.  Blood and imaging tests.  A procedure to remove tissue samples from your prostate gland for   testing (biopsy). What are the benefits of prostate cancer screening?  Screening can help to identify cancer at an early stage, before symptoms start and when the cancer can be treated more easily.  There is a small chance that screening may lower your risk of dying from prostate cancer. The chance is small because prostate cancer is a slow-growing cancer, and most men with prostate cancer die from a different cause. What are the risks of prostate cancer screening? The main risk of  prostate cancer screening is diagnosing and treating prostate cancer that would never have caused any symptoms or problems. This is called overdiagnosisand overtreatment. PSA screening cannot tell you if your PSA is high due to cancer or a different cause. A prostate biopsy is the only procedure to diagnose prostate cancer. Even the results of a biopsy may not tell you if your cancer needs to be treated. Slow-growing prostate cancer may not need any treatment other than monitoring, so diagnosing and treating it may cause unnecessary stress or other side effects. A prostate biopsy may also cause:  Infection or fever.  A false negative. This is a result that shows that you do not have prostate cancer when you actually do have prostate cancer. Questions to ask your health care provider  When should I start prostate cancer screening?  What is my risk for prostate cancer?  How often do I need screening?  What type of screening tests do I need?  How do I get my test results?  What do my results mean?  Do I need treatment? Where to find more information  The American Cancer Society: www.cancer.org  American Urological Association: www.auanet.org Contact a health care provider if:  You have difficulty urinating.  You have pain when you urinate or ejaculate.  You have blood in your urine or semen.  You have pain in your back or in the area of your prostate. Summary  Prostate cancer is a common type of cancer in men. The prostate gland is located below the bladder and in front of the rectum. This gland adds fluid to semen during ejaculation.  Prostate cancer screening may identify cancer at an early stage, when the cancer can be treated more easily.  The prostate-specific antigen (PSA) test is the recommended screening test for prostate cancer.  Discuss the risks and benefits of prostate cancer screening with your health care provider. If you are age 70 or older, the risks that  screening can cause are greater than the benefits that it may provide. This information is not intended to replace advice given to you by your health care provider. Make sure you discuss any questions you have with your health care provider. Document Revised: 04/11/2019 Document Reviewed: 08/01/2018 Elsevier Patient Education  2021 Elsevier Inc.  

## 2020-05-26 ENCOUNTER — Other Ambulatory Visit: Payer: Self-pay

## 2020-05-26 ENCOUNTER — Ambulatory Visit: Payer: Medicare Other | Admitting: Urology

## 2020-05-26 ENCOUNTER — Encounter: Payer: Self-pay | Admitting: Urology

## 2020-05-26 VITALS — BP 117/81 | HR 81 | Ht 70.0 in | Wt 136.0 lb

## 2020-05-26 DIAGNOSIS — R972 Elevated prostate specific antigen [PSA]: Secondary | ICD-10-CM

## 2020-05-26 NOTE — Progress Notes (Signed)
Patient present today and did not do fleets enema prep prior. Procedure was rescheduled.

## 2020-06-02 ENCOUNTER — Ambulatory Visit: Payer: Self-pay | Admitting: Urology

## 2020-06-03 ENCOUNTER — Other Ambulatory Visit: Payer: Self-pay | Admitting: Urology

## 2020-06-04 ENCOUNTER — Encounter: Payer: Self-pay | Admitting: Urology

## 2020-06-10 ENCOUNTER — Ambulatory Visit: Payer: Self-pay | Admitting: Urology

## 2020-06-16 ENCOUNTER — Ambulatory Visit: Payer: Self-pay | Admitting: Urology

## 2020-06-30 ENCOUNTER — Ambulatory Visit: Payer: Self-pay | Admitting: Urology

## 2020-07-04 ENCOUNTER — Emergency Department
Admission: EM | Admit: 2020-07-04 | Discharge: 2020-07-04 | Disposition: A | Discharge status: Home / Self Care | Payer: Medicare Other | Attending: Emergency Medicine | Admitting: Emergency Medicine

## 2020-07-04 ENCOUNTER — Encounter: Payer: Self-pay | Admitting: Emergency Medicine

## 2020-07-04 ENCOUNTER — Other Ambulatory Visit: Payer: Self-pay

## 2020-07-04 DIAGNOSIS — M06811 Other specified rheumatoid arthritis, right shoulder: Secondary | ICD-10-CM | POA: Insufficient documentation

## 2020-07-04 DIAGNOSIS — F17210 Nicotine dependence, cigarettes, uncomplicated: Secondary | ICD-10-CM | POA: Insufficient documentation

## 2020-07-04 DIAGNOSIS — M069 Rheumatoid arthritis, unspecified: Secondary | ICD-10-CM

## 2020-07-04 DIAGNOSIS — M05771 Rheumatoid arthritis with rheumatoid factor of right ankle and foot without organ or systems involvement: Secondary | ICD-10-CM | POA: Insufficient documentation

## 2020-07-04 MED ORDER — PREDNISONE 10 MG PO TABS: 10.0000 mg | ORAL_TABLET | Freq: Every day | ORAL | 0 refills | Status: AC

## 2020-07-04 MED ORDER — PREDNISONE 20 MG PO TABS
10.0000 mg | ORAL_TABLET | Freq: Once | ORAL | Status: AC
  Administered 2020-07-04: 10 mg via ORAL
  Filled 2020-07-04: qty 1

## 2020-07-04 NOTE — ED Provider Notes (Signed)
[facility] Emergency Department Provider Note  ____________________________________________   Event Date/Time   First MD Initiated Contact with Patient 07/04/20 1957     (approximate)  I have reviewed the triage vital signs and the nursing notes.   HISTORY  Chief Complaint Shoulder Pain   HPI Craig Garrett. is a 60 y.o. male who reports to the ER for evaluation of shoulder pain, ankle pain, flare of his RA.  He states that he has been out of his steroids for 2 weeks.  He did endorse to me earlier in triage that he had been using them more than he was supposed to and ran out earlier.  He states that he has been in touch with rheumatology about getting Humira injections, but states that due to the holidays he was unable to get this paid for in time.  He denies any fevers, new trauma.         Past Medical History:  Diagnosis Date   Arthritis    Hepatitis C    Tinea cruris     Patient Active Problem List   Diagnosis Date Noted   Nausea & vomiting 03/24/2011   Dysphagia 03/24/2011   Sinus tachycardia 02/26/2011   SVT (supraventricular tachycardia) (HCC) 02/25/2011   Atypical chest pain 02/25/2011   History of hepatitis C 02/23/2011   Small bowel obstruction, partial (HCC) 02/15/2011   SB mesentery injury 02/07/2011   Acute blood loss anemia 02/07/2011   Fracture of right talus 02/07/2011   Facial laceration 02/06/2011   Right ankle fracture, lateral malleolus 02/06/2011   Osteoarthritis 02/06/2011   Tinea cruris 02/06/2011    Past Surgical History:  Procedure Laterality Date   APPENDECTOMY  02/06/2011   Procedure: APPENDECTOMY;  Surgeon: Jetty Duhamel, MD;  Location: [facility] OR;  Service: General;  Laterality: N/A;  Exploratory laporatomy, small bowel resection, and incidental appendectomy   BOWEL RESECTION  02/06/2011   Procedure: SMALL BOWEL RESECTION;  Surgeon: Jetty Duhamel, MD;  Location: [facility] OR;  Service: General;;  Exploratory  laporatomy, small bowel resection, and incidental appendectomy   COLON SURGERY     ESOPHAGOGASTRODUODENOSCOPY  03/25/2011   Procedure: ESOPHAGOGASTRODUODENOSCOPY (EGD);  Surgeon: Charna Elizabeth, MD;  Location[facility] ENDOSCOPY;  Service: Endoscopy;  Laterality: N/A;   LACERATION REPAIR  02/06/2011   Procedure: REPAIR MULTIPLE LACERATIONS;  Surgeon: Darletta Moll, MD;  Location: [facility] OR;  Service: ENT;  Laterality: Right;   LAPAROTOMY  02/06/2011   Procedure: EXPLORATORY LAPAROTOMY;  Surgeon: Jetty Duhamel, MD;  Location: [facility] OR;  Service: General;  Laterality: N/A;  Exploratory laporatomy, small bowel resection, and incidental appendectomy   LAPAROTOMY  02/22/2011   Procedure: EXPLORATORY LAPAROTOMY;  Surgeon: Jetty Duhamel, MD;  Location: [facility] OR;  Service: General;  Laterality: N/A;   Left wrist surgery      Prior to Admission medications   Medication Sig Start Date End Date Taking? Authorizing Provider  predniSONE (DELTASONE) 10 MG tablet Take 1 tablet (10 mg total) by mouth daily for 7 days. 07/04/20 07/11/20 Yes Faatima Tench, Ruben Gottron, PA  alendronate (FOSAMAX) 70 MG tablet Take 70 mg by mouth once a week. 03/27/20   [provider]  folic acid (FOLVITE) 1 MG tablet Take 1 mg by mouth daily.    [provider]  methotrexate 2.5 MG tablet Take 2.5 mg by mouth 3 (three) times a week.    [provider]  predniSONE (DELTASONE) 10 MG tablet  Take 10 mg by mouth daily with breakfast.    [provider]    Allergies Patient has no known allergies.  Family History  Problem Relation Age of Onset   Hypertension Other    Prostate cancer Brother    Colon cancer Maternal Grandfather    Testicular cancer Other    Bladder Cancer Neg Hx    Kidney cancer Neg Hx     Social History Social History   Tobacco Use   Smoking status: Every Day    Packs/day: 1.00    Years: 42.00    Pack years: 42.00    Types: Cigarettes   Smokeless tobacco: Former    Quit date: 01/06/2011    Tobacco comments:    .25ppd currently  Substance Use Topics   Alcohol use: No   Drug use: No    Review of Systems Constitutional: No fever/chills Eyes: No visual changes. ENT: No sore throat. Cardiovascular: Denies chest pain. Respiratory: Denies shortness of breath. Gastrointestinal: No abdominal pain.  No nausea, no vomiting.  No diarrhea.  No constipation. Genitourinary: Negative for dysuria. Musculoskeletal: + Right shoulder pain, right ankle pain,+ arthralgias Skin: Negative for rash. Neurological: Negative for headaches, focal weakness or numbness.   ____________________________________________   PHYSICAL EXAM:  VITAL SIGNS: ED Triage Vitals  Enc Vitals Group     BP 07/04/20 1715 (!) 142/97     Pulse Rate 07/04/20 1715 (!) 104     Resp 07/04/20 1715 20     Temp 07/04/20 1715 98.4 F (36.9 C)     Temp Source 07/04/20 1715 Oral     SpO2 07/04/20 1715 99 %     Weight 07/04/20 1713 145 lb (65.8 kg)     Height 07/04/20 1713  (1.753 m)     Head Circumference --      Peak Flow --      Pain Score 07/04/20 1713 10     Pain Loc --      Pain Edu? --      Excl. in GC? --    Constitutional: Alert and oriented. Well appearing and in no acute distress. Eyes: Conjunctivae are normal. PERRL. EOMI. Head: Atraumatic. Nose: No congestion/rhinnorhea. Mouth/Throat: Mucous membranes are moist.  Oropharynx non-erythematous. Neck: No stridor.   Cardiovascular: Normal rate, regular rhythm. Grossly normal heart sounds.  Good peripheral circulation. Respiratory: Normal respiratory effort.  No retractions. Lungs CTAB. Gastrointestinal: Soft and nontender. No distention. No abdominal bruits. No CVA tenderness. Musculoskeletal: There is tenderness palpation over the right shoulder joint.  Patient able to move through full range of motion but with increased pain.  There is notable swelling grossly around the right ankle with increased pain with range of motion.  Radial pulses 2+  bilaterally, dorsal pedal pulse 2+ bilaterally. Neurologic:  Normal speech and language. No gross focal neurologic deficits are appreciated. No gait instability. Skin:  Skin is warm, dry and intact. No rash noted. Psychiatric: Mood and affect are normal. Speech and behavior are normal.   ____________________________________________   INITIAL IMPRESSION / ASSESSMENT AND PLAN / ED COURSE  As part of my medical decision making, I reviewed the following data within the electronic MEDICAL RECORD NUMBER Nursing notes reviewed and incorporated and Notes from prior ED visits        Patient is a 60 year old male with past medical history significant for RA who reports to the emergency department after being out of his medicines for the last 2 weeks.  See HPI for further  details.  In triage, patient has mild tachycardia of 104, otherwise grossly normal vital signs.  Physical exam as above.  Suspect that this is a likely flare of his RA given the recent absence of his steroid for the last 2 weeks.  Discussed the importance of follow-up with rheumatology, however given the holiday weekend I will give the patient a 1 week course of his baseline steroid and encouraged Tylenol use, 1000 mg 4 times daily for his pain.  Discussed the use of ice for pain and inflammation.  Return precautions were discussed at length the patient stable this time for outpatient follow-up.      ____________________________________________   FINAL CLINICAL IMPRESSION(S) / ED DIAGNOSES  Final diagnoses:  Rheumatoid arthritis involving multiple sites, unspecified whether rheumatoid factor present [facility])     ED Discharge Orders          Ordered    predniSONE (DELTASONE) 10 MG tablet  Daily        07/04/20 2019             Note:  This document was prepared using Dragon voice recognition software and may include unintentional dictation errors.    Lucy Chris, PA 07/04/20 2020    Sharyn Creamer, MD 07/04/20  [phone]

## 2020-07-04 NOTE — ED Triage Notes (Signed)
Pt reports has rheumatoid arthritis in his right shoulder and has not been taking his prednisone because they would not give him more because he was taking to many. Pt reports now pain out of control

## 2020-07-04 NOTE — ED Provider Notes (Signed)
Emergency Medicine Provider Triage Evaluation Note  Allan Minotti. , a 60 y.o. male  was evaluated in triage.  Pt complains of right shoulder pain that has been recently worsening. Pt has hx of rheumatoid arthritis but states he stopped taking his prednisone 2 weeks ago due to "having to take too much to control the pain".. States he was supposed to take 2 per day but had to take 4 per day due to his symptoms. Denies other current RA medications. States he stopped his prednisone "because it wasn't working". States he has a rheumatologist. States pain in swelling in multiple joints including right ankle, toes, rights houlder, worse in R shoulder. Denies hx of gout. Denies fever or traumatic injury  Review of Systems  Positive: Right shoulder pain Negative: Fever, trauma or acute injury  Physical Exam  BP (!) 142/97 (BP Location: Left Arm)   Pulse (!) 104   Temp 98.4 F (36.9 C) (Oral)   Resp 20   Ht 5\' 9"  (1.753 m)   Wt 65.8 kg   SpO2 99%   BMI 21.41 kg/m  Gen:   Awake, no distress  Resp:  Normal effort  MSK:   +R shoulder pain with movement. Radial pulse 2+ bilaterally. Swelling noted of right ankle Other:    Medical Decision Making  Medically screening exam initiated at 6:15 PM.  Appropriate orders placed.  . was informed that the remainder of the evaluation will be completed by another provider, this initial triage assessment does not replace that evaluation, and the importance of remaining in the ED until their evaluation is complete.    [name], PA 07/04/20 09/04/20, MD 07/04/20 [phone]

## 2020-07-04 NOTE — Discharge Instructions (Addendum)
Please arrange close follow-up with Dr. [facility] with [facility]ology.  You have been given a 1 week course of your prednisone.  Please also take Tylenol, up to 1000 mg 4 times daily for your pain.  You may also use ice for inflammation and pain.  Return to the emergency department if you experience any worsening in your symptoms.

## 2020-07-12 DIAGNOSIS — R7989 Other specified abnormal findings of blood chemistry: Secondary | ICD-10-CM | POA: Insufficient documentation

## 2021-02-15 ENCOUNTER — Telehealth: Payer: Self-pay | Admitting: Acute Care

## 2021-02-15 NOTE — Telephone Encounter (Signed)
Unable to reach by phone to schedule annual LDCT.  Voicemail not set up - unable to leave message

## 2021-04-01 DIAGNOSIS — E538 Deficiency of other specified B group vitamins: Secondary | ICD-10-CM | POA: Diagnosis not present

## 2021-04-01 DIAGNOSIS — Z1331 Encounter for screening for depression: Secondary | ICD-10-CM | POA: Diagnosis not present

## 2021-04-01 DIAGNOSIS — Z0000 Encounter for general adult medical examination without abnormal findings: Secondary | ICD-10-CM | POA: Diagnosis not present

## 2021-04-01 DIAGNOSIS — K912 Postsurgical malabsorption, not elsewhere classified: Secondary | ICD-10-CM | POA: Diagnosis not present

## 2021-04-01 DIAGNOSIS — B356 Tinea cruris: Secondary | ICD-10-CM | POA: Diagnosis not present

## 2021-04-01 DIAGNOSIS — F17210 Nicotine dependence, cigarettes, uncomplicated: Secondary | ICD-10-CM | POA: Diagnosis not present

## 2021-04-01 DIAGNOSIS — M069 Rheumatoid arthritis, unspecified: Secondary | ICD-10-CM | POA: Diagnosis not present

## 2021-04-01 DIAGNOSIS — K625 Hemorrhage of anus and rectum: Secondary | ICD-10-CM | POA: Diagnosis not present

## 2021-05-31 DIAGNOSIS — B356 Tinea cruris: Secondary | ICD-10-CM | POA: Diagnosis not present

## 2021-05-31 DIAGNOSIS — R202 Paresthesia of skin: Secondary | ICD-10-CM | POA: Diagnosis not present

## 2021-05-31 DIAGNOSIS — K625 Hemorrhage of anus and rectum: Secondary | ICD-10-CM | POA: Diagnosis not present

## 2021-06-16 DIAGNOSIS — M0579 Rheumatoid arthritis with rheumatoid factor of multiple sites without organ or systems involvement: Secondary | ICD-10-CM | POA: Diagnosis not present

## 2021-06-16 DIAGNOSIS — I700 Atherosclerosis of aorta: Secondary | ICD-10-CM | POA: Diagnosis not present

## 2021-06-16 DIAGNOSIS — J432 Centrilobular emphysema: Secondary | ICD-10-CM | POA: Diagnosis not present

## 2021-06-16 DIAGNOSIS — R195 Other fecal abnormalities: Secondary | ICD-10-CM | POA: Diagnosis not present

## 2021-06-16 DIAGNOSIS — K625 Hemorrhage of anus and rectum: Secondary | ICD-10-CM | POA: Diagnosis not present

## 2021-06-16 DIAGNOSIS — E538 Deficiency of other specified B group vitamins: Secondary | ICD-10-CM | POA: Diagnosis not present

## 2021-06-16 DIAGNOSIS — Z720 Tobacco use: Secondary | ICD-10-CM | POA: Diagnosis not present

## 2021-06-16 DIAGNOSIS — R946 Abnormal results of thyroid function studies: Secondary | ICD-10-CM | POA: Diagnosis not present

## 2021-06-16 DIAGNOSIS — Z125 Encounter for screening for malignant neoplasm of prostate: Secondary | ICD-10-CM | POA: Diagnosis not present

## 2021-06-23 DIAGNOSIS — M0579 Rheumatoid arthritis with rheumatoid factor of multiple sites without organ or systems involvement: Secondary | ICD-10-CM | POA: Diagnosis not present

## 2021-06-23 DIAGNOSIS — E876 Hypokalemia: Secondary | ICD-10-CM | POA: Diagnosis not present

## 2021-06-23 DIAGNOSIS — E538 Deficiency of other specified B group vitamins: Secondary | ICD-10-CM | POA: Diagnosis not present

## 2021-06-23 DIAGNOSIS — F17210 Nicotine dependence, cigarettes, uncomplicated: Secondary | ICD-10-CM | POA: Diagnosis not present

## 2021-06-23 DIAGNOSIS — Z0000 Encounter for general adult medical examination without abnormal findings: Secondary | ICD-10-CM | POA: Diagnosis not present

## 2021-06-23 DIAGNOSIS — J432 Centrilobular emphysema: Secondary | ICD-10-CM | POA: Diagnosis not present

## 2021-06-23 DIAGNOSIS — B356 Tinea cruris: Secondary | ICD-10-CM | POA: Diagnosis not present

## 2021-06-23 DIAGNOSIS — M069 Rheumatoid arthritis, unspecified: Secondary | ICD-10-CM | POA: Diagnosis not present

## 2021-06-30 DIAGNOSIS — E876 Hypokalemia: Secondary | ICD-10-CM | POA: Diagnosis not present

## 2021-09-01 DIAGNOSIS — M7989 Other specified soft tissue disorders: Secondary | ICD-10-CM | POA: Diagnosis not present

## 2021-09-01 DIAGNOSIS — Z1159 Encounter for screening for other viral diseases: Secondary | ICD-10-CM | POA: Diagnosis not present

## 2021-09-01 DIAGNOSIS — Z7960 Long term (current) use of unspecified immunomodulators and immunosuppressants: Secondary | ICD-10-CM | POA: Diagnosis not present

## 2021-09-01 DIAGNOSIS — M810 Age-related osteoporosis without current pathological fracture: Secondary | ICD-10-CM | POA: Diagnosis not present

## 2021-09-01 DIAGNOSIS — M2012 Hallux valgus (acquired), left foot: Secondary | ICD-10-CM | POA: Diagnosis not present

## 2021-09-01 DIAGNOSIS — E559 Vitamin D deficiency, unspecified: Secondary | ICD-10-CM | POA: Diagnosis not present

## 2021-09-01 DIAGNOSIS — M25471 Effusion, right ankle: Secondary | ICD-10-CM | POA: Diagnosis not present

## 2021-09-01 DIAGNOSIS — M19072 Primary osteoarthritis, left ankle and foot: Secondary | ICD-10-CM | POA: Diagnosis not present

## 2021-09-01 DIAGNOSIS — M1811 Unilateral primary osteoarthritis of first carpometacarpal joint, right hand: Secondary | ICD-10-CM | POA: Diagnosis not present

## 2021-09-01 DIAGNOSIS — M19071 Primary osteoarthritis, right ankle and foot: Secondary | ICD-10-CM | POA: Diagnosis not present

## 2021-09-01 DIAGNOSIS — M064 Inflammatory polyarthropathy: Secondary | ICD-10-CM | POA: Diagnosis not present

## 2021-09-01 DIAGNOSIS — M2011 Hallux valgus (acquired), right foot: Secondary | ICD-10-CM | POA: Diagnosis not present

## 2021-09-01 DIAGNOSIS — M19042 Primary osteoarthritis, left hand: Secondary | ICD-10-CM | POA: Diagnosis not present

## 2021-09-01 DIAGNOSIS — M0579 Rheumatoid arthritis with rheumatoid factor of multiple sites without organ or systems involvement: Secondary | ICD-10-CM | POA: Diagnosis not present

## 2021-10-26 DIAGNOSIS — M810 Age-related osteoporosis without current pathological fracture: Secondary | ICD-10-CM | POA: Diagnosis not present

## 2021-10-26 DIAGNOSIS — B369 Superficial mycosis, unspecified: Secondary | ICD-10-CM | POA: Diagnosis not present

## 2021-10-26 DIAGNOSIS — Z1331 Encounter for screening for depression: Secondary | ICD-10-CM | POA: Diagnosis not present

## 2021-10-26 DIAGNOSIS — F17210 Nicotine dependence, cigarettes, uncomplicated: Secondary | ICD-10-CM | POA: Diagnosis not present

## 2021-10-26 DIAGNOSIS — Z0000 Encounter for general adult medical examination without abnormal findings: Secondary | ICD-10-CM | POA: Diagnosis not present

## 2021-10-26 DIAGNOSIS — E538 Deficiency of other specified B group vitamins: Secondary | ICD-10-CM | POA: Diagnosis not present

## 2021-10-26 DIAGNOSIS — Z87891 Personal history of nicotine dependence: Secondary | ICD-10-CM | POA: Diagnosis not present

## 2022-02-02 DIAGNOSIS — M8580 Other specified disorders of bone density and structure, unspecified site: Secondary | ICD-10-CM | POA: Diagnosis not present

## 2022-02-02 DIAGNOSIS — E559 Vitamin D deficiency, unspecified: Secondary | ICD-10-CM | POA: Diagnosis not present

## 2022-02-02 DIAGNOSIS — Z7960 Long term (current) use of unspecified immunomodulators and immunosuppressants: Secondary | ICD-10-CM | POA: Diagnosis not present

## 2022-02-02 DIAGNOSIS — M0579 Rheumatoid arthritis with rheumatoid factor of multiple sites without organ or systems involvement: Secondary | ICD-10-CM | POA: Diagnosis not present

## 2022-02-06 DIAGNOSIS — B3789 Other sites of candidiasis: Secondary | ICD-10-CM | POA: Diagnosis not present

## 2022-02-06 DIAGNOSIS — R030 Elevated blood-pressure reading, without diagnosis of hypertension: Secondary | ICD-10-CM | POA: Diagnosis not present

## 2022-03-16 DIAGNOSIS — Z800 Family history of malignant neoplasm of digestive organs: Secondary | ICD-10-CM | POA: Diagnosis not present

## 2022-03-16 DIAGNOSIS — K90829 Short bowel syndrome, unspecified: Secondary | ICD-10-CM | POA: Diagnosis not present

## 2022-03-16 DIAGNOSIS — K529 Noninfective gastroenteritis and colitis, unspecified: Secondary | ICD-10-CM | POA: Diagnosis not present

## 2022-04-18 DIAGNOSIS — Z8739 Personal history of other diseases of the musculoskeletal system and connective tissue: Secondary | ICD-10-CM | POA: Diagnosis not present

## 2022-04-18 DIAGNOSIS — Z0000 Encounter for general adult medical examination without abnormal findings: Secondary | ICD-10-CM | POA: Diagnosis not present

## 2022-04-18 DIAGNOSIS — Z0001 Encounter for general adult medical examination with abnormal findings: Secondary | ICD-10-CM | POA: Diagnosis not present

## 2022-04-18 DIAGNOSIS — R946 Abnormal results of thyroid function studies: Secondary | ICD-10-CM | POA: Diagnosis not present

## 2022-04-18 DIAGNOSIS — I700 Atherosclerosis of aorta: Secondary | ICD-10-CM | POA: Diagnosis not present

## 2022-04-18 DIAGNOSIS — J432 Centrilobular emphysema: Secondary | ICD-10-CM | POA: Diagnosis not present

## 2022-04-18 DIAGNOSIS — R972 Elevated prostate specific antigen [PSA]: Secondary | ICD-10-CM | POA: Diagnosis not present

## 2022-04-18 DIAGNOSIS — M0579 Rheumatoid arthritis with rheumatoid factor of multiple sites without organ or systems involvement: Secondary | ICD-10-CM | POA: Diagnosis not present

## 2022-04-18 DIAGNOSIS — B356 Tinea cruris: Secondary | ICD-10-CM | POA: Diagnosis not present

## 2022-04-18 DIAGNOSIS — R7989 Other specified abnormal findings of blood chemistry: Secondary | ICD-10-CM | POA: Diagnosis not present

## 2022-04-18 DIAGNOSIS — Z720 Tobacco use: Secondary | ICD-10-CM | POA: Diagnosis not present

## 2022-04-18 DIAGNOSIS — B3789 Other sites of candidiasis: Secondary | ICD-10-CM | POA: Diagnosis not present

## 2022-04-18 DIAGNOSIS — E538 Deficiency of other specified B group vitamins: Secondary | ICD-10-CM | POA: Diagnosis not present

## 2022-04-19 DIAGNOSIS — M21961 Unspecified acquired deformity of right lower leg: Secondary | ICD-10-CM | POA: Diagnosis not present

## 2022-04-19 DIAGNOSIS — R21 Rash and other nonspecific skin eruption: Secondary | ICD-10-CM | POA: Diagnosis not present

## 2022-04-19 DIAGNOSIS — Z0000 Encounter for general adult medical examination without abnormal findings: Secondary | ICD-10-CM | POA: Diagnosis not present

## 2022-04-19 DIAGNOSIS — R7989 Other specified abnormal findings of blood chemistry: Secondary | ICD-10-CM | POA: Insufficient documentation

## 2022-04-19 DIAGNOSIS — Z1331 Encounter for screening for depression: Secondary | ICD-10-CM | POA: Diagnosis not present

## 2022-04-19 DIAGNOSIS — R972 Elevated prostate specific antigen [PSA]: Secondary | ICD-10-CM | POA: Diagnosis not present

## 2022-04-19 DIAGNOSIS — E538 Deficiency of other specified B group vitamins: Secondary | ICD-10-CM | POA: Diagnosis not present

## 2022-04-19 DIAGNOSIS — F17210 Nicotine dependence, cigarettes, uncomplicated: Secondary | ICD-10-CM | POA: Diagnosis not present

## 2022-05-03 DIAGNOSIS — Z7960 Long term (current) use of unspecified immunomodulators and immunosuppressants: Secondary | ICD-10-CM | POA: Diagnosis not present

## 2022-05-03 DIAGNOSIS — M0579 Rheumatoid arthritis with rheumatoid factor of multiple sites without organ or systems involvement: Secondary | ICD-10-CM | POA: Diagnosis not present

## 2022-05-15 NOTE — Progress Notes (Deleted)
05/16/2022 9:32 PM   Craig Garrett. 04-28-1960 [id number]  Referring provider: Barbette Reichmann, MD [address] [facility] [place],  [place] [address]  Urological history: 1. Elevated PSA -PSA (04/2022) 8.31 -PSA (2022) 8.21 -scheduled for biopsy - did not complete fleets enema prior to procedure - he then rescheduled biopsy several times and ultimately "No Showed "  2. Family history of prostate cancer -brother who passed away in his 65's of prostate cancer -grandfather w/ colon cancer or prostate cancer     No chief complaint on file.   HPI: Craig Grun. is a 62 y.o. male who presents today for elevated PSA.    Previous records reviewed.   He was seen by Dr. Richardo Hanks 05/10/2020 for an elevated PSA of 8.21.  His prior values had been 4.6 in August 2021 and 3.8 in November of 2020.  He was scheduled for prostate biopsy, but at the time of his biopsy, he did not perform the enema and was rescheduled.  He then rescheduled several more times and then "No Showed."  His PSA was 4.23 in June 2023 and now has increased again to 8.31 in April.  He also had microscopic hematuria w/ 8 RBC's seen on UA at PCP's office in 04/2022.   CT's in the 2018 noted prostatomegaly.    I PSS *** UA ***    Score:  1-7 Mild 8-19 Moderate 20-35 Severe    SHIM ***    Score: 1-7 Severe ED 8-11 Moderate ED 12-16 Mild-Moderate ED 17-21 Mild ED 22-25 No ED   PMH: Past Medical History:  Diagnosis Date   Arthritis    Hepatitis C    Tinea cruris     Surgical History: Past Surgical History:  Procedure Laterality Date   APPENDECTOMY  02/06/2011   Procedure: APPENDECTOMY;  Surgeon: Jetty Duhamel, MD;  Location: [facility] OR;  Service: General;  Laterality: N/A;  Exploratory laporatomy, small bowel resection, and incidental appendectomy   BOWEL RESECTION  02/06/2011   Procedure: SMALL BOWEL RESECTION;  Surgeon: Jetty Duhamel, MD;  Location: [facility] OR;  Service:  General;;  Exploratory laporatomy, small bowel resection, and incidental appendectomy   COLON SURGERY     ESOPHAGOGASTRODUODENOSCOPY  03/25/2011   Procedure: ESOPHAGOGASTRODUODENOSCOPY (EGD);  Surgeon: Charna Elizabeth, MD;  Location[facility] ENDOSCOPY;  Service: Endoscopy;  Laterality: N/A;   LACERATION REPAIR  02/06/2011   Procedure: REPAIR MULTIPLE LACERATIONS;  Surgeon: Darletta Moll, MD;  Location: [facility] OR;  Service: ENT;  Laterality: Right;   LAPAROTOMY  02/06/2011   Procedure: EXPLORATORY LAPAROTOMY;  Surgeon: Jetty Duhamel, MD;  Location[facility] OR;  Service: General;  Laterality: N/A;  Exploratory laporatomy, small bowel resection, and incidental appendectomy   LAPAROTOMY  02/22/2011   Procedure: EXPLORATORY LAPAROTOMY;  Surgeon: Jetty Duhamel, MD;  Location: [facility] OR;  Service: General;  Laterality: N/A;   Left wrist surgery      Home Medications:  Allergies as of 05/16/2022   No Known Allergies      Medication List        Accurate as of May 15, 2022  9:32 PM. If you have any questions, ask your nurse or doctor.          alendronate 70 MG tablet Commonly known as: FOSAMAX Take 70 mg by mouth once a week.   folic acid 1 MG tablet Commonly known as: FOLVITE Take 1 mg by mouth daily.  methotrexate 2.5 MG tablet Commonly known as: RHEUMATREX Take 2.5 mg by mouth 3 (three) times a week.   predniSONE 10 MG tablet Commonly known as: DELTASONE Take 10 mg by mouth daily with breakfast.        Allergies: No Known Allergies  Family History: Family History  Problem Relation Age of Onset   Hypertension Other    Prostate cancer Brother    Colon cancer Maternal Grandfather    Testicular cancer Other    Bladder Cancer Neg Hx    Kidney cancer Neg Hx     Social History:  reports that he has been smoking cigarettes. He has a 42.00 pack-year smoking history. He quit smokeless tobacco use about 11 years ago. He reports that he does not drink alcohol and does not use  drugs.  ROS: Pertinent ROS in HPI  Physical Exam: There were no vitals taken for this visit.  Constitutional:  Well nourished. Alert and oriented, No acute distress. HEENT: [place] AT, moist mucus membranes.  Trachea midline, no masses. Cardiovascular: No clubbing, cyanosis, or edema. Respiratory: Normal respiratory effort, no increased work of breathing. GI: Abdomen is soft, non tender, non distended, no abdominal masses. Liver and spleen not palpable.  No hernias appreciated.  Stool sample for occult testing is not indicated.   GU: No CVA tenderness.  No bladder fullness or masses.  Patient with circumcised/uncircumcised phallus. ***Foreskin easily retracted***  Urethral meatus is patent.  No penile discharge. No penile lesions or rashes. Scrotum without lesions, cysts, rashes and/or edema.  Testicles are located scrotally bilaterally. No masses are appreciated in the testicles. Left and right epididymis are normal. Rectal: Patient with  normal sphincter tone. Anus and perineum without scarring or rashes. No rectal masses are appreciated. Prostate is approximately *** grams, *** nodules are appreciated. Seminal vesicles are normal. Skin: No rashes, bruises or suspicious lesions. Lymph: No cervical or inguinal adenopathy. Neurologic: Grossly intact, no focal deficits, moving all 4 extremities. Psychiatric: Normal mood and affect.  Laboratory Data: Serum creatinine (04/2022) 0.9, GFR 98 Hemoglobin A1c (04/2022) 5.3 Urinalysis  See EPIC and HPI I have reviewed the labs.   Pertinent Imaging: N/A  Assessment & Plan:  ***  1. Elevated PSA -free and total PSA pending -threshold values of 2.5 for people in their 40's, 3.5 for people in their 50's, 4.5 for people in their 60's, 6.5 for people in their 70's *** -We reviewed the implications of an elevated PSA and the uncertainty surrounding it. In general, a man's PSA increases with age and is produced by both normal and cancerous prostate  tissue. -The differential diagnosis for elevated PSA includes BPH, prostate cancer, infection, recent intercourse/ejaculation, recent urethroscopic manipulation (foley placement/cystoscopy) or trauma, and prostatitis *** -Management of an elevated PSA can include observation or prostate biopsy and we discussed this in detail. Our goal is to detect clinically significant prostate cancers, and manage with either active surveillance, surgery, or radiation for localized disease. Risks of prostate biopsy include bleeding, infection (including life threatening sepsis), pain, and lower urinary symptoms. Hematuria, hematospermia, and blood in the stool are all common after biopsy and can persist up to 4 weeks ***   2. Microscopic hematuria - we discussed that there are a number of causes that can be associated with blood in the urine, such as stones, BPH, UTI's, damage to the urinary tract and/or cancer.   Sometimes, we do not find a cause or source of the hematuria.    -we discussed that new  guidelines place individuals into risk categories of low, intermediate and high risk categories.  These factors are based on age, smoking history and degree of blood in urine.    -we discussed that at this time, they are in the high risk stratification   -we discussed that the recommended protocol for further work up are are CT urogram and cysto   - we discussed that following the imaging study,  a cystoscopy is performed   -we discussed that a cystoscopy is a procedure that consists of passing a camera up their urethra after administering lidocaine to anesthetize and that after the procedure a minor amount of blood in the urine and/or burning which usually resolves in 24 to 48 hours may occur   -the patient had the opportunity to ask questions which were answered. Based upon this discussion, the patient is willing to proceed. Therefore, I've ordered: a CT Urogram and cystoscopy   - The patient will return following  all of the above for discussion of the results.  - UA  - Urine culture       No follow-ups on file.  These notes generated with voice recognition software. I apologize for typographical errors.  Cloretta Ned  [facility] [address]  [address] [place], [place] [address] [phone]

## 2022-05-16 ENCOUNTER — Ambulatory Visit: Payer: Medicare Other | Admitting: Urology

## 2022-05-16 ENCOUNTER — Telehealth: Payer: Self-pay | Admitting: Urology

## 2022-05-16 DIAGNOSIS — R3129 Other microscopic hematuria: Secondary | ICD-10-CM

## 2022-05-16 DIAGNOSIS — R972 Elevated prostate specific antigen [PSA]: Secondary | ICD-10-CM

## 2022-05-16 NOTE — Telephone Encounter (Signed)
Made new appt. 

## 2022-05-16 NOTE — Telephone Encounter (Signed)
Please let Dr. Marcello Fennel know that he did not show for his appointment.

## 2022-06-02 ENCOUNTER — Telehealth: Payer: Self-pay

## 2022-06-02 ENCOUNTER — Other Ambulatory Visit: Payer: Self-pay

## 2022-06-02 DIAGNOSIS — Z1211 Encounter for screening for malignant neoplasm of colon: Secondary | ICD-10-CM

## 2022-06-02 NOTE — Telephone Encounter (Signed)
Gastroenterology Pre-Procedure Review  Patient's insurance changed and Dr. [facility] is no longer in network with his insurance.  He stated that he has canceled all appt with [facility].   Request Date: 08/11/22 Requesting Physician: Dr. Allegra Lai  PATIENT REVIEW QUESTIONS: The patient responded to the following health history questions as indicated:    1. Are you having any GI issues? no 2. Do you have a personal history of Polyps? no 3. Do you have a family history of Colon Cancer or Polyps? no 4. Diabetes Mellitus? no 5. Joint replacements in the past 12 months?no 6. Major health problems in the past 3 months?no 7. Any artificial heart valves, MVP, or defibrillator?no    MEDICATIONS & ALLERGIES:    Patient reports the following regarding taking any anticoagulation/antiplatelet therapy:   Plavix, Coumadin, Eliquis, Xarelto, Lovenox, Pradaxa, Brilinta, or Effient? no Aspirin? no  Patient confirms/reports the following medications:  Current Outpatient Medications  Medication Sig Dispense Refill   alendronate (FOSAMAX) 70 MG tablet Take 70 mg by mouth once a week.     folic acid (FOLVITE) 1 MG tablet Take 1 mg by mouth daily.     methotrexate 2.5 MG tablet Take 2.5 mg by mouth 3 (three) times a week.     predniSONE (DELTASONE) 10 MG tablet Take 10 mg by mouth daily with breakfast.     No current facility-administered medications for this visit.    Patient confirms/reports the following allergies:  No Known Allergies  No orders of the defined types were placed in this encounter.   AUTHORIZATION INFORMATION Primary Insurance: 1D#: Group #:  Secondary Insurance: 1D#: Group #:  SCHEDULE INFORMATION: Date: 08/11/22 Time: Location: [facility]

## 2022-06-05 NOTE — Progress Notes (Deleted)
  05/16/2022 9:32 PM   Craig T Dejarnett Jr. 09/04/1960 [id number]  Referring provider: Hande, Vishwanath, MD [address] [facility] [place],  [place] [address]  Urological history: 1. Elevated PSA -PSA (04/2022) 8.31 -PSA (2022) 8.21 -scheduled for biopsy - did not complete fleets enema prior to procedure - he then rescheduled biopsy several times and ultimately "No Showed "  2. Family history of prostate cancer -brother who passed away in his 60's of prostate cancer -grandfather w/ colon cancer or prostate cancer     No chief complaint on file.   HPI: Craig T Plass Jr. is a 62 y.o. male who presents today for elevated PSA.    Previous records reviewed.   He was seen by Dr. Sninsky 05/10/2020 for an elevated PSA of 8.21.  His prior values had been 4.6 in August 2021 and 3.8 in November of 2020.  He was scheduled for prostate biopsy, but at the time of his biopsy, he did not perform the enema and was rescheduled.  He then rescheduled several more times and then "No Showed."  His PSA was 4.23 in June 2023 and now has increased again to 8.31 in April.  He also had microscopic hematuria w/ 8 RBC's seen on UA at PCP's office in 04/2022.   CT's in the 2018 noted prostatomegaly.    I PSS *** UA ***    Score:  1-7 Mild 8-19 Moderate 20-35 Severe    SHIM ***    Score: 1-7 Severe ED 8-11 Moderate ED 12-16 Mild-Moderate ED 17-21 Mild ED 22-25 No ED   PMH: Past Medical History:  Diagnosis Date   Arthritis    Hepatitis C    Tinea cruris     Surgical History: Past Surgical History:  Procedure Laterality Date   APPENDECTOMY  02/06/2011   Procedure: APPENDECTOMY;  Surgeon: James O Wyatt III, MD;  [facility]: [facility];  Service: General;  Laterality: N/A;  Exploratory laporatomy, small bowel resection, and incidental appendectomy   BOWEL RESECTION  02/06/2011   Procedure: SMALL BOWEL RESECTION;  Surgeon: James O Wyatt III, MD;  Location: [facility] OR;  Service:  General;;  Exploratory laporatomy, small bowel resection, and incidental appendectomy   COLON SURGERY     ESOPHAGOGASTRODUODENOSCOPY  03/25/2011   Procedure: ESOPHAGOGASTRODUODENOSCOPY (EGD);  Surgeon: Jyothi Mann, MD;  [facility];  Service: Endoscopy;  Laterality: N/A;   LACERATION REPAIR  02/06/2011   Procedure: REPAIR MULTIPLE LACERATIONS;  Surgeon: Sui W Teoh, MD;  Location: [facility];  Service: ENT;  Laterality: Right;   LAPAROTOMY  02/06/2011   Procedure: EXPLORATORY LAPAROTOMY;  Surgeon: James O Wyatt III, MD;  [facility];  Service: General;  Laterality: N/A;  Exploratory laporatomy, small bowel resection, and incidental appendectomy   LAPAROTOMY  02/22/2011   Procedure: EXPLORATORY LAPAROTOMY;  Surgeon: James O Wyatt III, MD;  [facility];  Service: General;  Laterality: N/A;   Left wrist surgery      Home Medications:  Allergies as of 05/16/2022   No Known Allergies      Medication List        Accurate as of May 15, 2022  9:32 PM. If you have any questions, ask your nurse or doctor.          alendronate 70 MG tablet Commonly known as: FOSAMAX Take 70 mg by mouth once a week.   folic acid 1 MG tablet Commonly known as: FOLVITE Take 1 mg by mouth daily.     methotrexate 2.5 MG tablet Commonly known as: RHEUMATREX Take 2.5 mg by mouth 3 (three) times a week.   predniSONE 10 MG tablet Commonly known as: DELTASONE Take 10 mg by mouth daily with breakfast.        Allergies: No Known Allergies  Family History: Family History  Problem Relation Age of Onset   Hypertension Other    Prostate cancer Brother    Colon cancer Maternal Grandfather    Testicular cancer Other    Bladder Cancer Neg Hx    Kidney cancer Neg Hx     Social History:  reports that he has been smoking cigarettes. He has a 42.00 pack-year smoking history. He quit smokeless tobacco use about 11 years ago. He reports that he does not drink alcohol and does not use  drugs.  ROS: Pertinent ROS in HPI  Physical Exam: There were no vitals taken for this visit.  Constitutional:  Well nourished. Alert and oriented, No acute distress. HEENT: [place] AT, moist mucus membranes.  Trachea midline, no masses. Cardiovascular: No clubbing, cyanosis, or edema. Respiratory: Normal respiratory effort, no increased work of breathing. GI: Abdomen is soft, non tender, non distended, no abdominal masses. Liver and spleen not palpable.  No hernias appreciated.  Stool sample for occult testing is not indicated.   GU: No CVA tenderness.  No bladder fullness or masses.  Patient with circumcised/uncircumcised phallus. ***Foreskin easily retracted***  Urethral meatus is patent.  No penile discharge. No penile lesions or rashes. Scrotum without lesions, cysts, rashes and/or edema.  Testicles are located scrotally bilaterally. No masses are appreciated in the testicles. Left and right epididymis are normal. Rectal: Patient with  normal sphincter tone. Anus and perineum without scarring or rashes. No rectal masses are appreciated. Prostate is approximately *** grams, *** nodules are appreciated. Seminal vesicles are normal. Skin: No rashes, bruises or suspicious lesions. Lymph: No cervical or inguinal adenopathy. Neurologic: Grossly intact, no focal deficits, moving all 4 extremities. Psychiatric: Normal mood and affect.  Laboratory Data: Serum creatinine (04/2022) 0.9, GFR 98 Hemoglobin A1c (04/2022) 5.3 Urinalysis  See EPIC and HPI I have reviewed the labs.   Pertinent Imaging: N/A  Assessment & Plan:  ***  1. Elevated PSA -free and total PSA pending -threshold values of 2.5 for people in their 40's, 3.5 for people in their 50's, 4.5 for people in their 60's, 6.5 for people in their 70's *** -We reviewed the implications of an elevated PSA and the uncertainty surrounding it. In general, a man's PSA increases with age and is produced by both normal and cancerous prostate  tissue. -The differential diagnosis for elevated PSA includes BPH, prostate cancer, infection, recent intercourse/ejaculation, recent urethroscopic manipulation (foley placement/cystoscopy) or trauma, and prostatitis *** -Management of an elevated PSA can include observation or prostate biopsy and we discussed this in detail. Our goal is to detect clinically significant prostate cancers, and manage with either active surveillance, surgery, or radiation for localized disease. Risks of prostate biopsy include bleeding, infection (including life threatening sepsis), pain, and lower urinary symptoms. Hematuria, hematospermia, and blood in the stool are all common after biopsy and can persist up to 4 weeks ***   2. Microscopic hematuria - we discussed that there are a number of causes that can be associated with blood in the urine, such as stones, BPH, UTI's, damage to the urinary tract and/or cancer.   Sometimes, we do not find a cause or source of the hematuria.    -we discussed that new   guidelines place individuals into risk categories of low, intermediate and high risk categories.  These factors are based on age, smoking history and degree of blood in urine.    -we discussed that at this time, they are in the high risk stratification   -we discussed that the recommended protocol for further work up are are CT urogram and cysto   - we discussed that following the imaging study,  a cystoscopy is performed   -we discussed that a cystoscopy is a procedure that consists of passing a camera up their urethra after administering lidocaine to anesthetize and that after the procedure a minor amount of blood in the urine and/or burning which usually resolves in 24 to 48 hours may occur   -the patient had the opportunity to ask questions which were answered. Based upon this discussion, the patient is willing to proceed. Therefore, I've ordered: a CT Urogram and cystoscopy   - The patient will return following  all of the above for discussion of the results.  - UA  - Urine culture       No follow-ups on file.  These notes generated with voice recognition software. I apologize for typographical errors.  Ione Sandusky, PA-C  [facility] [address]  [address] [place], [place] [address] [phone]  

## 2022-06-06 ENCOUNTER — Ambulatory Visit: Payer: Medicare HMO | Admitting: Urology

## 2022-06-06 DIAGNOSIS — R972 Elevated prostate specific antigen [PSA]: Secondary | ICD-10-CM

## 2022-06-06 DIAGNOSIS — R3129 Other microscopic hematuria: Secondary | ICD-10-CM

## 2022-06-07 DIAGNOSIS — B372 Candidiasis of skin and nail: Secondary | ICD-10-CM | POA: Diagnosis not present

## 2022-06-07 DIAGNOSIS — L280 Lichen simplex chronicus: Secondary | ICD-10-CM | POA: Diagnosis not present

## 2022-08-02 ENCOUNTER — Other Ambulatory Visit: Payer: Self-pay

## 2022-08-02 MED ORDER — NA SULFATE-K SULFATE-MG SULF 17.5-3.13-1.6 GM/177ML PO SOLN: 1.0000 | Freq: Once | ORAL | 0 refills | Status: AC

## 2022-08-10 ENCOUNTER — Encounter: Payer: Self-pay | Admitting: Gastroenterology

## 2022-08-10 ENCOUNTER — Telehealth: Payer: Self-pay | Admitting: Gastroenterology

## 2022-08-10 ENCOUNTER — Telehealth: Payer: Self-pay

## 2022-08-10 DIAGNOSIS — Z1211 Encounter for screening for malignant neoplasm of colon: Secondary | ICD-10-CM

## 2022-08-10 MED ORDER — NA SULFATE-K SULFATE-MG SULF 17.5-3.13-1.6 GM/177ML PO SOLN: 1.0000 | Freq: Once | ORAL | 0 refills | Status: AC

## 2022-08-10 NOTE — Telephone Encounter (Signed)
Colonoscopy R/S from 08/11/22 due to moving paperwork has to be completed.  Colonoscopy rescheduled to 09/27/22 with Dr. Allegra Lai still at [facility]  Vikki in endo notified of date change.  Address not updated yet because patient said he will not be at the new address until the end of the month.

## 2022-08-10 NOTE — Telephone Encounter (Signed)
Patient called in to reschedule his procedure

## 2022-09-26 ENCOUNTER — Telehealth: Payer: Self-pay

## 2022-09-26 NOTE — Telephone Encounter (Signed)
Called patient since the endoscopy unit reached out stating that the patient didn't know about his procedure for tomorrow. However, Craig Garrett had written a note stating that he had to reschedule his procedure from 08/11/2022 to 09/27/2022. Patient stated that he didn't know anything about but told him that Craig Garrett had documented that they had spoken, he stated that he didn't recall. I asked to reschedule and he stated that he was not interested. I also saw that the patient is a [facility] patient and that he had procedures scheduled and cancelled them too. I will send the referral back to Dr. Marcello Fennel letting him know to send to [facility].

## 2022-09-27 ENCOUNTER — Encounter: Admission: RE | Payer: Self-pay | Source: Home / Self Care

## 2022-09-27 ENCOUNTER — Ambulatory Visit: Admission: RE | Admit: 2022-09-27 | Payer: 59 | Source: Home / Self Care | Admitting: Gastroenterology

## 2022-09-27 SURGERY — COLONOSCOPY WITH PROPOFOL
Anesthesia: General

## 2023-03-26 NOTE — Progress Notes (Unsigned)
 03/27/2023 4:09 PM   Craig Garrett. 11-07-1960 [id number]  Referring provider: Barbette Reichmann, MD [address] [facility] [place],  [place] [address]  Urological history: 1. Elevated PSA -PSA (03/15/2023) 10.01 -PSA (2022) 8.21 -PSA (2021) 4.6 -PSA (2020) 3.8  2. Family history of prostate cancer -brother passed in his 24's -grandfather   Chief Complaint  Patient presents with   Elevated PSA   HPI: Craig Garrett. is a 63 y.o. male who presents today for elevated PSA.  Previous records reviewed.   I PSS 5/2   IPSS     Row Name 03/27/23 1500         International Prostate Symptom Score   How often have you had the sensation of not emptying your bladder? Not at All     How often have you had to urinate less than every two hours? Not at All     How often have you found you stopped and started again several times when you urinated? Not at All     How often have you found it difficult to postpone urination? Less than 1 in 5 times     How often have you had a weak urinary stream? Less than half the time     How often have you had to strain to start urination? Less than 1 in 5 times     How many times did you typically get up at night to urinate? 1 Time     Total IPSS Score 5       Quality of Life due to urinary symptoms   If you were to spend the rest of your life with your urinary condition just the way it is now how would you feel about that? Mostly Satisfied              Score:  1-7 Mild 8-19 Moderate 20-35 Severe   SHIM 22   SHIM     Row Name 03/27/23 1555         SHIM: Over the last 6 months:   How do you rate your confidence that you could get and keep an erection? Moderate     When you had erections with sexual stimulation, how often were your erections hard enough for penetration (entering your partner)? Almost Always or Always     During sexual intercourse, how often were you able to maintain your erection  after you had penetrated (entered) your partner? Almost Always or Always     During sexual intercourse, how difficult was it to maintain your erection to completion of intercourse? Slightly Difficult     When you attempted sexual intercourse, how often was it satisfactory for you? Almost Always or Always       SHIM Total Score   SHIM 22              Score: 1-7 Severe ED 8-11 Moderate ED 12-16 Mild-Moderate ED 17-21 Mild ED 22-25 No ED   PMH: Past Medical History:  Diagnosis Date   Arthritis    Hepatitis C    Tinea cruris     Surgical History: Past Surgical History:  Procedure Laterality Date   APPENDECTOMY  02/06/2011   Procedure: APPENDECTOMY;  Surgeon: Jetty Duhamel, MD;  [facility];  Service: General;  Laterality: N/A;  Exploratory laporatomy, small bowel resection, and incidental appendectomy   BOWEL RESECTION  02/06/2011   Procedure: SMALL BOWEL RESECTION;  Surgeon: Cherylynn Ridges  III, MD;  [facility];  Service: General;;  Exploratory laporatomy, small bowel resection, and incidental appendectomy   COLON SURGERY     ESOPHAGOGASTRODUODENOSCOPY  03/25/2011   Procedure: ESOPHAGOGASTRODUODENOSCOPY (EGD);  Surgeon: Charna Elizabeth, MD;  Location: [facility] ENDOSCOPY;  Service: Endoscopy;  Laterality: N/A;   LACERATION REPAIR  02/06/2011   Procedure: REPAIR MULTIPLE LACERATIONS;  Surgeon: Darletta Moll, MD;  Location: [facility] OR;  Service: ENT;  Laterality: Right;   LAPAROTOMY  02/06/2011   Procedure: EXPLORATORY LAPAROTOMY;  Surgeon: Jetty Duhamel, MD;  [facility];  Service: General;  Laterality: N/A;  Exploratory laporatomy, small bowel resection, and incidental appendectomy   LAPAROTOMY  02/22/2011   Procedure: EXPLORATORY LAPAROTOMY;  Surgeon: Jetty Duhamel, MD;  [facility];  Service: General;  Laterality: N/A;   Left wrist surgery      Home Medications:  Allergies as of 03/27/2023   No Known Allergies      Medication List        Accurate as of March 27, 2023  4:09 PM. If you have any questions, ask your nurse or doctor.          STOP taking these medications    alendronate 70 MG tablet Commonly known as: FOSAMAX Stopped by: Ellar Hakala       TAKE these medications    cyanocobalamin 1000 MCG tablet Commonly known as: VITAMIN B12 Take by mouth.   folic acid 1 MG tablet Commonly known as: FOLVITE Take 1 mg by mouth daily.   hydroxychloroquine 200 MG tablet Commonly known as: PLAQUENIL Take 1 tablet by mouth 2 (two) times daily.   methotrexate 2.5 MG tablet Commonly known as: RHEUMATREX Take 20 mg by mouth once a week.   varenicline 1 MG tablet Commonly known as: CHANTIX Take by mouth.   Vitamin D (Ergocalciferol) 1.25 MG (50000 UNIT) Caps capsule Commonly known as: DRISDOL Take 50,000 Units by mouth once a week.   Xeljanz 5 MG Tabs Generic drug: Tofacitinib Citrate Take 1 tablet by mouth 2 (two) times daily.        Allergies: No Known Allergies  Family History: Family History  Problem Relation Age of Onset   Hypertension Other    Prostate cancer Brother    Colon cancer Maternal Grandfather    Testicular cancer Other    Bladder Cancer Neg Hx    Kidney cancer Neg Hx     Social History:  reports that he has been smoking cigarettes. He has a 42 pack-year smoking history. He quit smokeless tobacco use about 12 years ago. He reports that he does not drink alcohol and does not use drugs.  ROS: Pertinent ROS in HPI  Physical Exam: Ht 5\' 10"  (1.778 m)   Wt 146 lb (66.2 kg)   BMI 20.95 kg/m   Constitutional:  Well nourished. Alert and oriented, No acute distress. HEENT: [place] AT, moist mucus membranes.  Trachea midline Cardiovascular: No clubbing, cyanosis, or edema. Respiratory: Normal respiratory effort, no increased work of breathing. GU: No CVA tenderness.  No bladder fullness or masses.  Patient with uncircumcised phallus. Foreskin easily retracted  Urethral meatus is patent.  No penile discharge.  No penile lesions or rashes. Scrotum without lesions, cysts, rashes and/or edema.  Testicles are located scrotally bilaterally. No masses are appreciated in the testicles. Left and right epididymis are normal. Rectal: Patient with  normal sphincter tone. Anus and perineum without scarring or rashes. No rectal masses are appreciated. Prostate  is approximately 60  grams, no nodules are appreciated.  Seminal vesicles not palpable.   Neurologic: Grossly intact, no focal deficits, moving all 4 extremities. Psychiatric: Normal mood and affect.  Laboratory Data: ontains abnormal data CBC w/auto Differential (5 Part) Order: [id number] Component Ref Range & Units 11 d ago  WBC (White Blood Cell Count) 4.1 - 10.2 10^3/uL 11.5 High   RBC (Red Blood Cell Count) 4.69 - 6.13 10^6/uL 4.59 Low   Hemoglobin 14.1 - 18.1 gm/dL 86.5  Hematocrit 78.4 - 52.0 % 42.1  MCV (Mean Corpuscular Volume) 80.0 - 100.0 fl 91.7  MCH (Mean Corpuscular Hemoglobin) 27.0 - 31.2 pg 31.6 High   MCHC (Mean Corpuscular Hemoglobin Concentration) 32.0 - 36.0 gm/dL 69.6  Platelet Count 295 - 450 10^3/uL 248  RDW-CV (Red Cell Distribution Width) 11.6 - 14.8 % 15.9 High   MPV (Mean Platelet Volume) 9.4 - 12.4 fl 9.8  Neutrophils 1.50 - 7.80 10^3/uL 9.29 High   Lymphocytes 1.00 - 3.60 10^3/uL 1.37  Monocytes 0.00 - 1.50 10^3/uL 0.72  Eosinophils 0.00 - 0.55 10^3/uL 0.06  Basophils 0.00 - 0.09 10^3/uL 0.02  Neutrophil % 32.0 - 70.0 % 80.8 High   Lymphocyte % 10.0 - 50.0 % 11.9  Monocyte % 4.0 - 13.0 % 6.3  Eosinophil % 1.0 - 5.0 % 0.5 Low   Basophil% 0.0 - 2.0 % 0.2  Immature Granulocyte % <=0.7 % 0.3  Immature Granulocyte Count <=0.06 10^3/L 0.04  Resulting Agency [facility]   Specimen Collected: 03/15/23 11:11   Performed by: [facility] Last Resulted: 03/15/23 11:40  Received From: [facility]  Result Received: 03/16/23 15:20   Contains abnormal data  Comprehensive Metabolic Panel (CMP) Order: [id number] Component Ref Range & Units 11 d ago  Glucose 70 - 110 mg/dL 102 High   Sodium 725 - 145 mmol/L 142  Potassium 3.6 - 5.1 mmol/L 4.3  Chloride 97 - 109 mmol/L 108  Carbon Dioxide (CO2) 22.0 - 32.0 mmol/L 25.6  Urea Nitrogen (BUN) 7 - 25 mg/dL 16  Creatinine 0.7 - 1.3 mg/dL 0.8  Glomerular Filtration Rate (eGFR) >60 mL/min/1.73sq m 101  Comment: CKD-EPI (2021) does not include patient's race in the calculation of eGFR.  Monitoring changes of plasma creatinine and eGFR over time is useful for monitoring kidney function.  Interpretive Ranges for eGFR (CKD-EPI 2021):  eGFR:       >60 mL/min/1.73 sq. m - Normal eGFR:       30-59 mL/min/1.73 sq. m - Moderately Decreased eGFR:       15-29 mL/min/1.73 sq. m  - Severely Decreased eGFR:       < 15 mL/min/1.73 sq. m  - Kidney Failure   Note: These eGFR calculations do not apply in acute situations when eGFR is changing rapidly or patients on dialysis.  Calcium 8.7 - 10.3 mg/dL 9  AST 8 - 39 U/L 17  ALT 6 - 57 U/L 13  Alk Phos (alkaline Phosphatase) 34 - 104 U/L 73  Albumin 3.5 - 4.8 g/dL 4  Bilirubin, Total 0.3 - 1.2 mg/dL 1  Protein, Total 6.1 - 7.9 g/dL 6.3  A/G Ratio 1.0 - 5.0 gm/dL 1.7  Resulting Agency [facility]   Specimen Collected: 03/15/23 11:11   Performed by: [facility] Last Resulted: 03/15/23 16:09  Received From: [facility]  Result Received: 03/16/23 15:20   Hemoglobin A1C Order: [id number] Component Ref Range &  Units 11 d ago  Hemoglobin A1C 4.2 - 5.6 % 5.3  Average Blood Glucose (Calc) mg/dL 161  Resulting Agency [facility]  Narrative Performed by [facility] Normal Range:    4.2 - 5.6% Increased Risk:  5.7 - 6.4% Diabetes:        >= 6.5% Glycemic Control for adults with diabetes:  <7%    Specimen Collected: 03/15/23 11:11   Performed by: [facility] Last Resulted: 03/15/23 14:48  Received From: [facility]  Result Received: 03/16/23 15:20    Lipid Panel w/calc LDL Order: [id number] Component Ref Range & Units 11 d ago  Cholesterol, Total 100 - 200 mg/dL 83 Low   Triglyceride 35 - 199 mg/dL 811  HDL (High Density Lipoprotein) Cholesterol 29.0 - 71.0 mg/dL 91.4  LDL Calculated 0 - 130 mg/dL 18  VLDL Cholesterol mg/dL 21  Cholesterol/HDL Ratio 1.9  Resulting Agency [facility]   Specimen Collected: 03/15/23 11:11   Performed by: [facility] Last Resulted: 03/15/23 16:09  Received From: [facility]  Result Received: 03/16/23 15:20   Urinalysis w/Microscopic Order: [id number] Component Ref Range & Units 11 d ago  Color Colorless, Straw, Light Yellow, Yellow, Dark Yellow Yellow  Clarity Clear Clear  Specific Gravity 1.005 - 1.030 1.029  pH, Urine 5.0 - 8.0 5.5  Protein, Urinalysis Negative mg/dL Negative  Glucose, Urinalysis Negative mg/dL Negative  Ketones, Urinalysis Negative mg/dL Negative  Blood, Urinalysis Negative Negative  Nitrite, Urinalysis Negative Negative  Leukocyte Esterase, Urinalysis Negative 1+ Abnormal   Bilirubin, Urinalysis Negative Negative  Urobilinogen, Urinalysis 0.2 - 1.0 mg/dL 2 High   WBC, UA <=5 /hpf 4  Red Blood Cells, Urinalysis <=3 /hpf 6 High   Bacteria, Urinalysis 0 - 5 /hpf 0-5  Squamous Epithelial Cells, Urinalysis /hpf 0  Resulting Agency [facility]   Specimen Collected: 03/15/23 11:11   Performed by: [facility] Last Resulted: 03/15/23 11:51  [facility]  Result Received: 03/16/23 15:20  Urinalysis See EPIC and HPI  I have reviewed the labs.   Pertinent Imaging: N/A  Assessment & Plan:    1. Elevated PSA -free and total PSA pending -We reviewed the implications of an elevated PSA and the uncertainty surrounding it. In general,  a man's PSA increases with age and is produced by both normal and cancerous prostate tissue. -The differential diagnosis for elevated PSA includes BPH, prostate cancer, infection, recent intercourse/ejaculation, recent urethroscopic manipulation (foley placement/cystoscopy) or trauma, and prostatitis  -we discuss that if his free and total PSA returned with a high probability of having prostate cancer, we will perform a prostate MRI for further stratification and preparation for fusion biopsy  2. BPH with LU TS -no bothersome symptoms   Return for pending free and total PSA results .  These notes generated with voice recognition software. I apologize for typographical errors.  Cloretta Ned  [facility] [address]  [address] [place], [place] [address] [phone]

## 2023-03-27 ENCOUNTER — Ambulatory Visit (INDEPENDENT_AMBULATORY_CARE_PROVIDER_SITE_OTHER): Admitting: Urology

## 2023-03-27 ENCOUNTER — Encounter: Payer: Self-pay | Admitting: Urology

## 2023-03-27 VITALS — Ht 70.0 in | Wt 146.0 lb

## 2023-03-27 DIAGNOSIS — R972 Elevated prostate specific antigen [PSA]: Secondary | ICD-10-CM

## 2023-03-27 DIAGNOSIS — N138 Other obstructive and reflux uropathy: Secondary | ICD-10-CM | POA: Diagnosis not present

## 2023-03-27 DIAGNOSIS — N401 Enlarged prostate with lower urinary tract symptoms: Secondary | ICD-10-CM

## 2023-03-28 ENCOUNTER — Other Ambulatory Visit: Payer: Self-pay | Admitting: Urology

## 2023-03-28 DIAGNOSIS — N138 Other obstructive and reflux uropathy: Secondary | ICD-10-CM

## 2023-03-28 DIAGNOSIS — R972 Elevated prostate specific antigen [PSA]: Secondary | ICD-10-CM

## 2023-03-28 LAB — PSA, TOTAL AND FREE
PSA, Free Pct: 12.8 %
PSA, Free: 1.39 ng/mL
Prostate Specific Ag, Serum: 10.9 ng/mL — ABNORMAL HIGH (ref 0.0–4.0)

## 2023-03-31 ENCOUNTER — Ambulatory Visit

## 2023-04-04 ENCOUNTER — Ambulatory Visit: Admission: RE | Admit: 2023-04-04 | Source: Ambulatory Visit

## 2023-04-07 ENCOUNTER — Ambulatory Visit
Admission: RE | Admit: 2023-04-07 | Discharge: 2023-04-07 | Disposition: A | Discharge status: Home / Self Care | Source: Ambulatory Visit | Attending: Urology | Admitting: Urology

## 2023-04-07 DIAGNOSIS — R972 Elevated prostate specific antigen [PSA]: Secondary | ICD-10-CM | POA: Diagnosis present

## 2023-04-07 DIAGNOSIS — N401 Enlarged prostate with lower urinary tract symptoms: Secondary | ICD-10-CM | POA: Insufficient documentation

## 2023-04-07 DIAGNOSIS — N138 Other obstructive and reflux uropathy: Secondary | ICD-10-CM | POA: Diagnosis present

## 2023-04-07 MED ORDER — GADOBUTROL 1 MMOL/ML IV SOLN
6.0000 mL | Freq: Once | INTRAVENOUS | Status: AC | PRN
Start: 2023-04-07 — End: 2023-04-07
  Administered 2023-04-07: 6 mL via INTRAVENOUS

## 2023-05-04 ENCOUNTER — Ambulatory Visit: Admitting: Urology

## 2023-05-05 ENCOUNTER — Telehealth: Payer: Self-pay | Admitting: Urology

## 2023-05-05 NOTE — Telephone Encounter (Signed)
 Mr. Riesen missed his appointment with me to go over his prostate MRI results.  Please have him reschedule with my next available.

## 2023-05-17 ENCOUNTER — Ambulatory Visit: Admitting: Urology

## 2023-05-24 ENCOUNTER — Ambulatory Visit (INDEPENDENT_AMBULATORY_CARE_PROVIDER_SITE_OTHER): Admitting: Urology

## 2023-05-24 ENCOUNTER — Encounter: Payer: Self-pay | Admitting: Urology

## 2023-05-24 VITALS — BP 127/86 | HR 74 | Ht 70.0 in | Wt 150.0 lb

## 2023-05-24 DIAGNOSIS — N401 Enlarged prostate with lower urinary tract symptoms: Secondary | ICD-10-CM | POA: Diagnosis not present

## 2023-05-24 DIAGNOSIS — R972 Elevated prostate specific antigen [PSA]: Secondary | ICD-10-CM | POA: Diagnosis not present

## 2023-05-24 DIAGNOSIS — N138 Other obstructive and reflux uropathy: Secondary | ICD-10-CM | POA: Diagnosis not present

## 2023-05-24 NOTE — Progress Notes (Signed)
 05/24/2023 5:10 PM   Raeann Buhl. Nov 18, 1960 [id number]  Referring provider: Antonio Baumgarten, MD [address] [facility] [place],  [place] [address]  Urological history: 1. Elevated PSA -PSA (03/15/2023) 10.01 -PSA (2022) 8.21 -PSA (2021) 4.6 -PSA (2020) 3.8  2. Family history of prostate cancer -brother passed in his 81's -grandfather   Chief Complaint  Patient presents with   Results   HPI: Craig Garrett. is a 63 y.o. male who presents today to discuss prostate MRI results.    Previous records reviewed.   He he has some issues with urinary frequency and erectile dysfunction, but they are not bothersome at this time.    His PSA on 03/2023 was 10.9, free PSA 1.39, PSA % 12.8, making a probability of 24% of prostate cancer.  Prostate MRI completed on April 07, 2023 noted a PI-RADS 2 category lesion in the right and left posterior lateral peripheral zones.  These areas are designated as regions of interest by the radiologist in case we need to pursue biopsy.  There was no transscapular spread, seminal vesicle involvement, neurovascular bundle involvement, pelvic adenopathy is absent and there are no evidence of bone metastases.  Prostate volume of 33.9.  Repeat free and total PSA is pending from today.   PMH: Past Medical History:  Diagnosis Date   Arthritis    Hepatitis C    Tinea cruris     Surgical History: Past Surgical History:  Procedure Laterality Date   APPENDECTOMY  02/06/2011   Procedure: APPENDECTOMY;  Surgeon: Sherril Dole, MD;  Location: [facility] OR;  Service: General;  Laterality: N/A;  Exploratory laporatomy, small bowel resection, and incidental appendectomy   BOWEL RESECTION  02/06/2011   Procedure: SMALL BOWEL RESECTION;  Surgeon: Sherril Dole, MD;  Location: [facility] OR;  Service: General;;  Exploratory laporatomy, small bowel resection, and incidental appendectomy   COLON SURGERY     ESOPHAGOGASTRODUODENOSCOPY   03/25/2011   Procedure: ESOPHAGOGASTRODUODENOSCOPY (EGD);  Surgeon: Tami Falcon, MD;  Location: [facility] ENDOSCOPY;  Service: Endoscopy;  Laterality: N/A;   LACERATION REPAIR  02/06/2011   Procedure: REPAIR MULTIPLE LACERATIONS;  Surgeon: Lawence Press, MD;  Location: [facility] OR;  Service: ENT;  Laterality: Right;   LAPAROTOMY  02/06/2011   Procedure: EXPLORATORY LAPAROTOMY;  Surgeon: Sherril Dole, MD;  Location: [facility] OR;  Service: General;  Laterality: N/A;  Exploratory laporatomy, small bowel resection, and incidental appendectomy   LAPAROTOMY  02/22/2011   Procedure: EXPLORATORY LAPAROTOMY;  Surgeon: Sherril Dole, MD;  Location: [facility] OR;  Service: General;  Laterality: N/A;   Left wrist surgery      Home Medications:  Allergies as of 05/24/2023   No Known Allergies      Medication List        Accurate as of May 24, 2023  5:10 PM. If you have any questions, ask your nurse or doctor.          folic acid  1 MG tablet Commonly known as: FOLVITE  Take 1 mg by mouth daily.   hydroxychloroquine 200 MG tablet Commonly known as: PLAQUENIL Take 1 tablet by mouth 2 (two) times daily.   methotrexate 2.5 MG tablet Commonly known as: RHEUMATREX Take 20 mg by mouth once a week.   Vitamin D (Ergocalciferol) 1.25 MG (50000 UNIT) Caps capsule Commonly known as: DRISDOL Take 50,000 Units by mouth once a week.   Xeljanz 5 MG Tabs Generic drug: Tofacitinib Citrate  Take 1 tablet by mouth 2 (two) times daily.        Allergies: No Known Allergies  Family History: Family History  Problem Relation Age of Onset   Hypertension Other    Prostate cancer Brother    Colon cancer Maternal Grandfather    Testicular cancer Other    Bladder Cancer Neg Hx    Kidney cancer Neg Hx     Social History:  reports that he has been smoking cigarettes. He has a 42 pack-year smoking history. He quit smokeless tobacco use about 12 years ago. He reports that he does not drink alcohol  and does not use  drugs.  ROS: Pertinent ROS in HPI  Physical Exam: BP 127/86   Pulse 74   Ht 5\' 10"  (1.778 m)   Wt 150 lb (68 kg)   BMI 21.52 kg/m   Constitutional:  Well nourished. Alert and oriented, No acute distress. HEENT: [place] AT, moist mucus membranes.  Trachea midline Cardiovascular: No clubbing, cyanosis, or edema. Respiratory: Normal respiratory effort, no increased work of breathing. Neurologic: Grossly intact, no focal deficits, moving all 4 extremities. Psychiatric: Normal mood and affect.   Laboratory Data: ontains abnormal data CBC w/auto Differential (5 Part) Order: [id number] Component Ref Range & Units 11 d ago  WBC (White Blood Cell Count) 4.1 - 10.2 10^3/uL 11.5 High   RBC (Red Blood Cell Count) 4.69 - 6.13 10^6/uL 4.59 Low   Hemoglobin 14.1 - 18.1 gm/dL 40.9  Hematocrit 81.1 - 52.0 % 42.1  MCV (Mean Corpuscular Volume) 80.0 - 100.0 fl 91.7  MCH (Mean Corpuscular Hemoglobin) 27.0 - 31.2 pg 31.6 High   MCHC (Mean Corpuscular Hemoglobin Concentration) 32.0 - 36.0 gm/dL 91.4  Platelet Count 782 - 450 10^3/uL 248  RDW-CV (Red Cell Distribution Width) 11.6 - 14.8 % 15.9 High   MPV (Mean Platelet Volume) 9.4 - 12.4 fl 9.8  Neutrophils 1.50 - 7.80 10^3/uL 9.29 High   Lymphocytes 1.00 - 3.60 10^3/uL 1.37  Monocytes 0.00 - 1.50 10^3/uL 0.72  Eosinophils 0.00 - 0.55 10^3/uL 0.06  Basophils 0.00 - 0.09 10^3/uL 0.02  Neutrophil % 32.0 - 70.0 % 80.8 High   Lymphocyte % 10.0 - 50.0 % 11.9  Monocyte % 4.0 - 13.0 % 6.3  Eosinophil % 1.0 - 5.0 % 0.5 Low   Basophil% 0.0 - 2.0 % 0.2  Immature Granulocyte % <=0.7 % 0.3  Immature Granulocyte Count <=0.06 10^3/L 0.04  Resulting Agency [facility]   Specimen Collected: 03/15/23 11:11   Performed by: [facility] Last Resulted: 03/15/23 11:40  Received From: [facility]  Result Received: 03/16/23 15:20   Contains abnormal data Comprehensive Metabolic Panel (CMP) Order:  [id number] Component Ref Range & Units 11 d ago  Glucose 70 - 110 mg/dL 578 High   Sodium 469 - 145 mmol/L 142  Potassium 3.6 - 5.1 mmol/L 4.3  Chloride 97 - 109 mmol/L 108  Carbon Dioxide (CO2) 22.0 - 32.0 mmol/L 25.6  Urea Nitrogen (BUN) 7 - 25 mg/dL 16  Creatinine 0.7 - 1.3 mg/dL 0.8  Glomerular Filtration Rate (eGFR) >60 mL/min/1.73sq m 101  Comment: CKD-EPI (2021) does not include patient's race in the calculation of eGFR.  Monitoring changes of plasma creatinine and eGFR over time is useful for monitoring kidney function.  Interpretive Ranges for eGFR (CKD-EPI 2021):  eGFR:       >60 mL/min/1.73 sq. m - Normal eGFR:       30-59 mL/min/1.73  sq. m - Moderately Decreased eGFR:       15-29 mL/min/1.73 sq. m  - Severely Decreased eGFR:       < 15 mL/min/1.73 sq. m  - Kidney Failure   Note: These eGFR calculations do not apply in acute situations when eGFR is changing rapidly or patients on dialysis.  Calcium  8.7 - 10.3 mg/dL 9  AST 8 - 39 U/L 17  ALT 6 - 57 U/L 13  Alk Phos (alkaline Phosphatase) 34 - 104 U/L 73  Albumin  3.5 - 4.8 g/dL 4  Bilirubin, Total 0.3 - 1.2 mg/dL 1  Protein, Total 6.1 - 7.9 g/dL 6.3  A/G Ratio 1.0 - 5.0 gm/dL 1.7  Resulting Agency [facility]   Specimen Collected: 03/15/23 11:11   Performed by: [facility] Last Resulted: 03/15/23 16:09  Received From: [facility]  Result Received: 03/16/23 15:20   Hemoglobin A1C Order: [id number] Component Ref Range & Units 11 d ago  Hemoglobin A1C 4.2 - 5.6 % 5.3  Average Blood Glucose (Calc) mg/dL 782  Resulting Agency [facility]  Narrative Performed by [facility] Normal Range:    4.2 - 5.6% Increased Risk:  5.7 - 6.4% Diabetes:        >= 6.5% Glycemic Control for adults with diabetes:  <7%    Specimen Collected: 03/15/23 11:11   Performed by: [facility] Last Resulted: 03/15/23 14:48  Received  From: [facility]  Result Received: 03/16/23 15:20    Lipid Panel w/calc LDL Order: [id number] Component Ref Range & Units 11 d ago  Cholesterol, Total 100 - 200 mg/dL 83 Low   Triglyceride 35 - 199 mg/dL 578  HDL (High Density Lipoprotein) Cholesterol 29.0 - 71.0 mg/dL 46.9  LDL Calculated 0 - 130 mg/dL 18  VLDL Cholesterol mg/dL 21  Cholesterol/HDL Ratio 1.9  Resulting Agency [facility]   Specimen Collected: 03/15/23 11:11   Performed by: [facility] Last Resulted: 03/15/23 16:09  Received From: [facility]  Result Received: 03/16/23 15:20   Urinalysis w/Microscopic Order: [id number] Component Ref Range & Units 11 d ago  Color Colorless, Straw, Light Yellow, Yellow, Dark Yellow Yellow  Clarity Clear Clear  Specific Gravity 1.005 - 1.030 1.029  pH, Urine 5.0 - 8.0 5.5  Protein, Urinalysis Negative mg/dL Negative  Glucose, Urinalysis Negative mg/dL Negative  Ketones, Urinalysis Negative mg/dL Negative  Blood, Urinalysis Negative Negative  Nitrite, Urinalysis Negative Negative  Leukocyte Esterase, Urinalysis Negative 1+ Abnormal   Bilirubin, Urinalysis Negative Negative  Urobilinogen, Urinalysis 0.2 - 1.0 mg/dL 2 High   WBC, UA <=5 /hpf 4  Red Blood Cells, Urinalysis <=3 /hpf 6 High   Bacteria, Urinalysis 0 - 5 /hpf 0-5  Squamous Epithelial Cells, Urinalysis /hpf 0  Resulting Agency [facility]   Specimen Collected: 03/15/23 11:11   Performed by: [facility] Last Resulted: 03/15/23 11:51  Received From: [facility]  Result Received: 03/16/23 15:20  Urinalysis See EPIC and HPI  I have reviewed the labs.   Pertinent Imaging: CLINICAL DATA:  Elevated PSA level.  R97.20   EXAM: MR PROSTATE WITHOUT AND WITH CONTRAST   TECHNIQUE: Multiplanar multisequence MRI images were obtained of the pelvis centered about the prostate. Pre and post  contrast images were obtained.   CONTRAST:  6mL GADAVIST  GADOBUTROL  1 MMOL/ML IV  SOLN   COMPARISON:  CT pelvis 07/21/2016   FINDINGS: Despite efforts by the technologist and patient, motion artifact is present on today's exam and could not be eliminated. This reduces exam sensitivity and specificity.   Prostate: Encapsulated nodularity in the transition zone compatible with benign prostatic hypertrophy.   Geographic and wedge-shaped low T2 signal in the peripheral zone is noted throughout the left posterolateral peripheral zone from the base through the apex, and in the right posterolateral peripheral zone likewise from the base through the apex. Faintly but relatively non focally reduced ADC map activity with some associated restriction of diffusion. In general these are considered PI-RADS category 2. However, they are designated as regions of interest below in case the magnitude of the PSA level and other clinical factors indicate the need for biopsy.   Region of interest # 1: PI-RADS category 2 lesion of the right posterolateral peripheral zone extending from the base to the apex, with reduced T2 signal (image 43 series 9 and some faintly reduced ADC map activity and restriction of diffusion (image 12 series 7 and 8). This measures 1.20 cc (1.7 by 0.7 by 1.7 cm).   Region of interest # 2: PI-RADS category 2 lesion of the left posterolateral peripheral zone extending from the base through the apex, with reduced T2 signal (image 39 series 9) corresponding to faintly reduced ADC map activity and faintly restricted diffusion (image 11 of series 7 and 8). This measures 1.64 cc (1.7 by 1.1 by 1.7 cm).   Volume: 3D volumetric analysis: Prostate volume 33.9 cc (5.3 by 3.2 by 3.8 cm).   Transcapsular spread: Absent   Seminal vesicle involvement: Absent   Neurovascular bundle involvement: Absent   Pelvic adenopathy: Absent   Bone metastasis: Absent   Other findings: No  other significant findings.   IMPRESSION: 1. PI-RADS category 2 lesions in the right and left posterolateral peripheral zone. Morphologically these are not suspicious, although there is some mild reduction in ADC map activity and accentuated diffusion associated with some. For this reason, these are designated as regions of interest in case the magnitude of PSA level and other clinical factors indicate the need for biopsy. 2. Benign prostatic hypertrophy.     Electronically Signed   By: Freida Jes M.D.   On: 04/16/2023 12:34  Assessment & Plan:    1. Elevated PSA -free and total PSA pending - Explained if the free and total PSA from today remains elevated, I will recommend a fusion biopsy of the prostate - Patient will be schedule for a TRUSPBx of prostate.  The procedure is explained and the risks involved, such as blood in urine, blood in stool, blood in semen, infection, urinary retention, and on rare occasions sepsis and death.  Patient understands the risks as explained to him.      2. BPH with LU TS -no bothersome symptoms   Return for pending free and total PSA results .  These notes generated with voice recognition software. I apologize for typographical errors.  Briant Camper  [facility] [address]  [address] [place], [place] [address] [phone]

## 2023-05-25 LAB — PSA, TOTAL AND FREE
PSA, Free Pct: 13.4 %
PSA, Free: 1.07 ng/mL
Prostate Specific Ag, Serum: 8 ng/mL — ABNORMAL HIGH (ref 0.0–4.0)

## 2023-05-29 ENCOUNTER — Ambulatory Visit: Payer: Self-pay | Admitting: Urology

## 2023-06-22 ENCOUNTER — Ambulatory Visit: Admitting: Urology

## 2023-06-22 VITALS — BP 113/83 | HR 76

## 2023-06-22 DIAGNOSIS — C61 Malignant neoplasm of prostate: Secondary | ICD-10-CM | POA: Diagnosis not present

## 2023-06-22 DIAGNOSIS — R972 Elevated prostate specific antigen [PSA]: Secondary | ICD-10-CM

## 2023-06-22 DIAGNOSIS — R9389 Abnormal findings on diagnostic imaging of other specified body structures: Secondary | ICD-10-CM | POA: Diagnosis not present

## 2023-06-22 DIAGNOSIS — Z2989 Encounter for other specified prophylactic measures: Secondary | ICD-10-CM

## 2023-06-22 DIAGNOSIS — Z792 Long term (current) use of antibiotics: Secondary | ICD-10-CM

## 2023-06-22 MED ORDER — LEVOFLOXACIN 500 MG PO TABS
500.0000 mg | ORAL_TABLET | Freq: Once | ORAL | Status: AC
  Administered 2023-06-22: 500 mg via ORAL

## 2023-06-22 MED ORDER — GENTAMICIN SULFATE 40 MG/ML IJ SOLN
80.0000 mg | Freq: Once | INTRAMUSCULAR | Status: AC
  Administered 2023-06-22: 80 mg via INTRAMUSCULAR

## 2023-06-22 NOTE — Patient Instructions (Signed)

## 2023-06-22 NOTE — Progress Notes (Signed)
   06/22/23  Indication: 63 year old male with a personal history of rising PSA most recently up to 10.  He underwent a PI-RADS lesion that just showed a PI-RADS 2 area noted bilaterally in the right and left posterior transition zones.  Prostate volume was 33.9.  MRI Fusion Prostate Biopsy Procedure   Informed consent was obtained, and we discussed the risks of bleeding and infection/sepsis. A time out was performed to ensure correct patient identity.  Pre-Procedure: - Gentamicin and levaquin given for antibiotic prophylaxis - Small median lobe noted  Procedure: - Prostate block performed using 10 cc 1% lidocaine   - MRI fusion biopsy was performed, and 3 biopsies were taken from the ROI #1 PIRADS lesion located on the right posterior lateral peripheral zone and another additional 3 biopsies taken from ROI #2 at the left posterior lateral peripheral zone. - Standard biopsies taken from sextant areas, 12 under ultrasound guidance. - Total of 18 cores taken  Post-Procedure: - Patient tolerated the procedure well - He was counseled to seek immediate medical attention if experiences significant bleeding, fevers, or severe pain - Return in one week to discuss biopsy results  Assessment/ Plan: Will follow up in 1-2 weeks to discuss patholog  Dustin Gimenez, MD

## 2023-06-25 ENCOUNTER — Other Ambulatory Visit: Payer: Self-pay | Admitting: Internal Medicine

## 2023-06-25 DIAGNOSIS — F17210 Nicotine dependence, cigarettes, uncomplicated: Secondary | ICD-10-CM

## 2023-06-25 DIAGNOSIS — R7989 Other specified abnormal findings of blood chemistry: Secondary | ICD-10-CM

## 2023-06-28 ENCOUNTER — Ambulatory Visit
Admission: RE | Admit: 2023-06-28 | Discharge: 2023-06-28 | Disposition: A | Discharge status: Home / Self Care | Source: Ambulatory Visit | Attending: Internal Medicine | Admitting: Internal Medicine

## 2023-06-28 DIAGNOSIS — R7989 Other specified abnormal findings of blood chemistry: Secondary | ICD-10-CM | POA: Insufficient documentation

## 2023-06-28 DIAGNOSIS — F17210 Nicotine dependence, cigarettes, uncomplicated: Secondary | ICD-10-CM | POA: Insufficient documentation

## 2023-07-11 ENCOUNTER — Telehealth: Payer: Self-pay

## 2023-07-11 ENCOUNTER — Ambulatory Visit: Admitting: Urology

## 2023-07-11 DIAGNOSIS — C61 Malignant neoplasm of prostate: Secondary | ICD-10-CM

## 2023-07-11 NOTE — Progress Notes (Signed)
 This service is provided via telemedicine   No vital signs collected/recorded due to the encounter was a telemedicine visit.     Patient consents to a telephone visit: Yes    Names of all persons participating in the telemedicine service and their role in the encounter: Alm Fell, Patient, Santa Gang, CMA, Redell Burnet, MD
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
## 2023-07-11 NOTE — Telephone Encounter (Signed)
 Spoke with [name]. Advising him that his Lab appointment was scheduled for 01/01/2024 at 2pm. Also, his follow up appointment with Dr.Sninsky will be 01/09/2024 at 11am. Both appointments will be located in [place].

## 2023-08-30 NOTE — Telephone Encounter (Signed)
 error

## 2023-12-12 ENCOUNTER — Ambulatory Visit: Admitting: Urology

## 2024-01-01 ENCOUNTER — Other Ambulatory Visit

## 2024-01-01 ENCOUNTER — Other Ambulatory Visit: Payer: Self-pay

## 2024-01-01 DIAGNOSIS — R972 Elevated prostate specific antigen [PSA]: Secondary | ICD-10-CM

## 2024-01-02 LAB — PSA: Prostate Specific Ag, Serum: 11.1 ng/mL — ABNORMAL HIGH (ref 0.0–4.0)

## 2024-01-09 ENCOUNTER — Ambulatory Visit: Admitting: Urology

## 2024-01-16 ENCOUNTER — Ambulatory Visit (INDEPENDENT_AMBULATORY_CARE_PROVIDER_SITE_OTHER): Admitting: Urology

## 2024-01-16 VITALS — BP 112/82 | HR 91 | Ht 70.0 in | Wt 161.0 lb

## 2024-01-16 DIAGNOSIS — C61 Malignant neoplasm of prostate: Secondary | ICD-10-CM

## 2024-01-16 DIAGNOSIS — N529 Male erectile dysfunction, unspecified: Secondary | ICD-10-CM

## 2024-01-16 MED ORDER — TADALAFIL 5 MG PO TABS: 5.0000 mg | ORAL_TABLET | Freq: Every day | ORAL | 11 refills | Status: AC | PRN

## 2024-01-16 NOTE — Patient Instructions (Addendum)
 Avoid any sexual activity or ejaculations for 3 days prior to having your PSA blood test done, as this can cause false increases in PSA  Understanding Erectile Dysfunction (ED)  What is ED? Erectile Dysfunction, or ED, is when a man has trouble getting or keeping an erection enough for sex. It is common and can happen at any age but is more common as men get older.  What Causes ED? ED can happen for many reasons, including:    Stress or feeling anxious Health problems like diabetes, heart disease, or high blood pressure Lifestyle habits like smoking, drinking too much alcohol , drug use, or not enough exercise Certain medicines(some blood pressure medications, antidepressants, sedatives)  Symptoms of ED:   Difficulty getting an erection Trouble keeping an erection during sex Reduced interest in sex  Treatment Options There are different ways to treat ED. Talk to your doctor to find whats best for you.  Lifestyle Changes   Exercise regularly Eat healthy foods Quit smoking and limit alcohol  Weight loss Reduce stress  Medications   Oral medicines like Viagra(sildenafil) or Cialis (tadalafil ).  These are generic and affordable, the lowest price is at [employer].com.  These must be taken about an hour before sexual activity, and still require sexual stimulation to get an erection Side effects can include stuffy nose, headache, muscle pain, or changes in vision There is no risk of heart attack or stroke when medications are taken as directed These medications cannot be taken with nitrates for chest pain  Other Treatments    Penile injections-a medicine is injected directly into the penis to give you an immediate erection Devices like pump vacuum systems that help create an erection Surgery: Penile prosthesis for patients with no improvement with medicines or injections who are still interested in sexual activity.  Visit [email or web address] for more details.  Dr. Lovie is a  urologist in [facility] with [facility] who performs penile prosthesis surgeries Counseling or Therapy    If ED is caused by stress, anxiety, or relationship issues, talking to a counselor or therapist can help.
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
## 2024-07-08 ENCOUNTER — Other Ambulatory Visit

## 2024-07-15 ENCOUNTER — Ambulatory Visit: Admitting: Urology
